# Patient Record
Sex: Female | Born: 1980 | Hispanic: Yes | Marital: Single | Smoking: Never smoker
Health system: Southern US, Community
[De-identification: ages and names within clinical notes are randomized; demographics above are authoritative.]

## PROBLEM LIST (undated history)

## (undated) ENCOUNTER — Inpatient Hospital Stay (HOSPITAL_COMMUNITY): Payer: Self-pay

## (undated) DIAGNOSIS — F32A Depression, unspecified: Secondary | ICD-10-CM

## (undated) DIAGNOSIS — F329 Major depressive disorder, single episode, unspecified: Secondary | ICD-10-CM

## (undated) DIAGNOSIS — G5601 Carpal tunnel syndrome, right upper limb: Secondary | ICD-10-CM

## (undated) HISTORY — DX: Major depressive disorder, single episode, unspecified: F32.9

## (undated) HISTORY — DX: Depression, unspecified: F32.A

---

## 2005-01-14 ENCOUNTER — Ambulatory Visit: Payer: Self-pay | Admitting: Family Medicine

## 2005-01-24 ENCOUNTER — Emergency Department (HOSPITAL_COMMUNITY): Admission: EM | Admit: 2005-01-24 | Discharge: 2005-01-24 | Payer: Self-pay | Admitting: Emergency Medicine

## 2005-03-23 ENCOUNTER — Ambulatory Visit: Payer: Self-pay | Admitting: Family Medicine

## 2005-03-31 ENCOUNTER — Encounter: Admission: RE | Admit: 2005-03-31 | Discharge: 2005-03-31 | Payer: Self-pay | Admitting: Family Medicine

## 2005-07-19 ENCOUNTER — Ambulatory Visit: Payer: Self-pay | Admitting: Family Medicine

## 2005-07-26 ENCOUNTER — Ambulatory Visit: Payer: Self-pay | Admitting: *Deleted

## 2005-09-07 ENCOUNTER — Ambulatory Visit: Payer: Self-pay | Admitting: Internal Medicine

## 2005-09-20 ENCOUNTER — Ambulatory Visit: Payer: Self-pay | Admitting: Internal Medicine

## 2005-09-27 ENCOUNTER — Ambulatory Visit: Payer: Self-pay | Admitting: Internal Medicine

## 2005-10-12 ENCOUNTER — Ambulatory Visit: Payer: Self-pay | Admitting: Family Medicine

## 2005-10-12 ENCOUNTER — Encounter (INDEPENDENT_AMBULATORY_CARE_PROVIDER_SITE_OTHER): Payer: Self-pay | Admitting: Family Medicine

## 2005-10-26 ENCOUNTER — Ambulatory Visit: Payer: Self-pay | Admitting: Family Medicine

## 2005-11-05 ENCOUNTER — Ambulatory Visit (HOSPITAL_COMMUNITY): Admission: RE | Admit: 2005-11-05 | Discharge: 2005-11-05 | Payer: Self-pay | Admitting: Family Medicine

## 2005-11-05 IMAGING — US US PELVIS COMPLETE MODIFY
1 series · 14 of 25 positions shown · non-contrast
Comparison: None.

CLINICAL DATA: Cervical motion tenderness.
 TRANSABDOMINAL AND TRANSVAGINAL PELVIC ULTRASOUND:
TECHNIQUE: Both transabdominal and transvaginal ultrasound examinations of the pelvis were performed including evaluation of the uterus, ovaries, adnexal regions, and pelvic cul-de-sac.

[Series 1: unknown · 0.29mm/px · 14 of 51 slices shown]
[im 1/51]
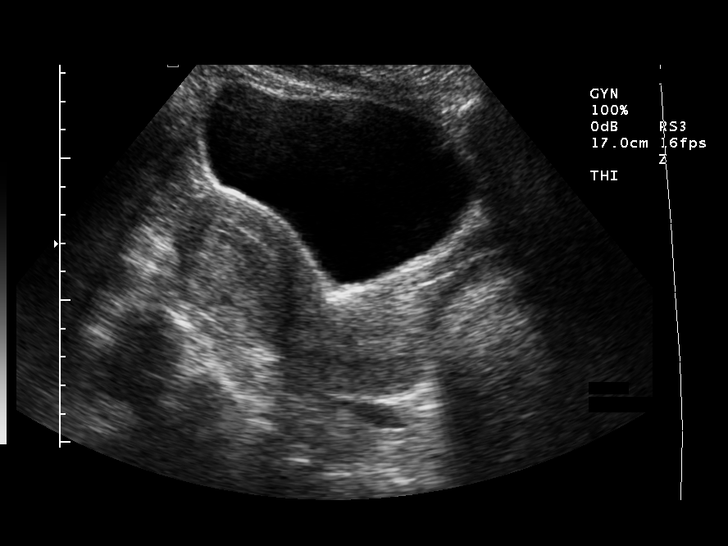
[im 5/51]
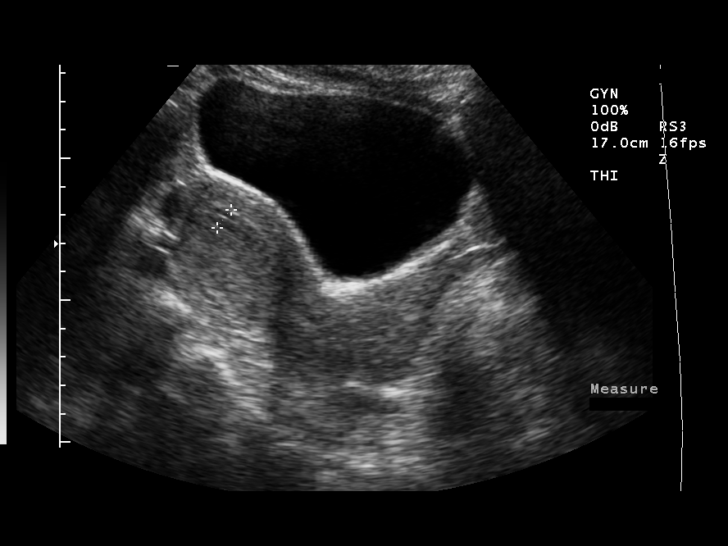
[im 9/51]
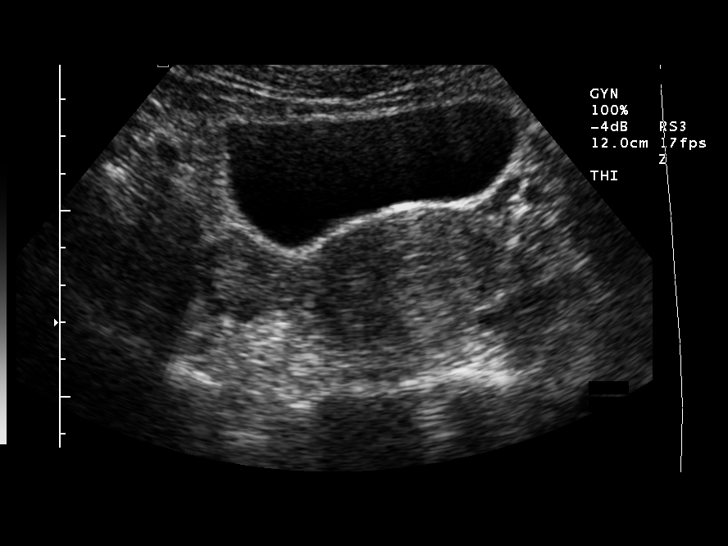
[im 13/51]
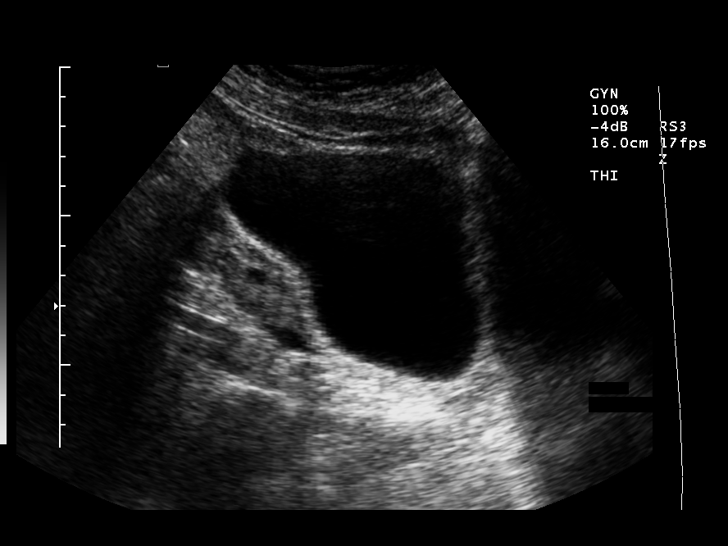
[im 17/51]
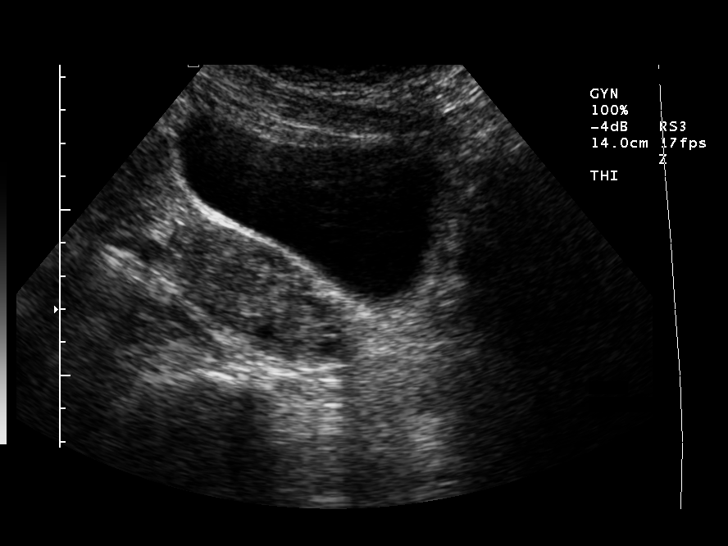
[im 19/51]
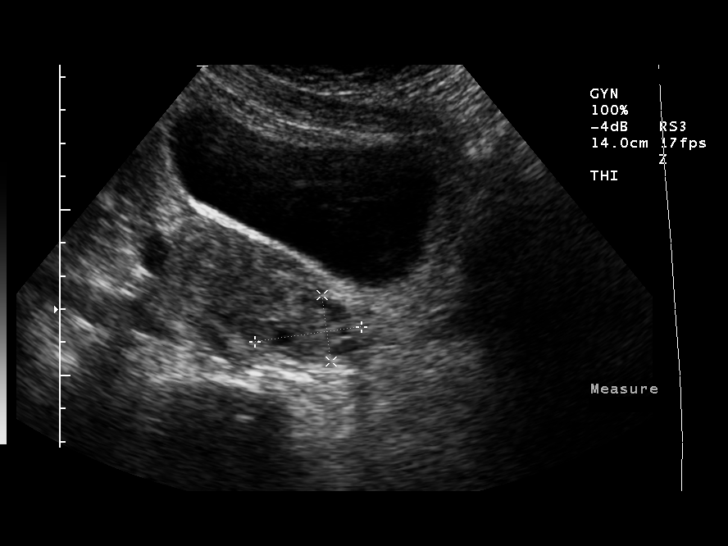
[im 23/51]
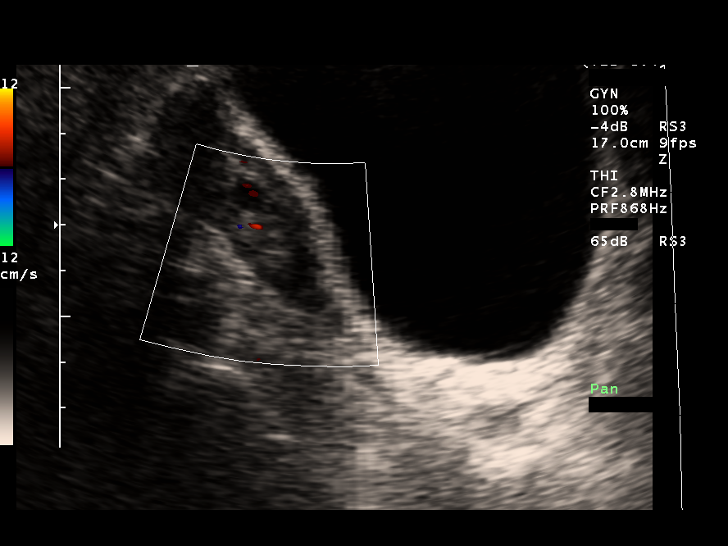
[im 28/51]
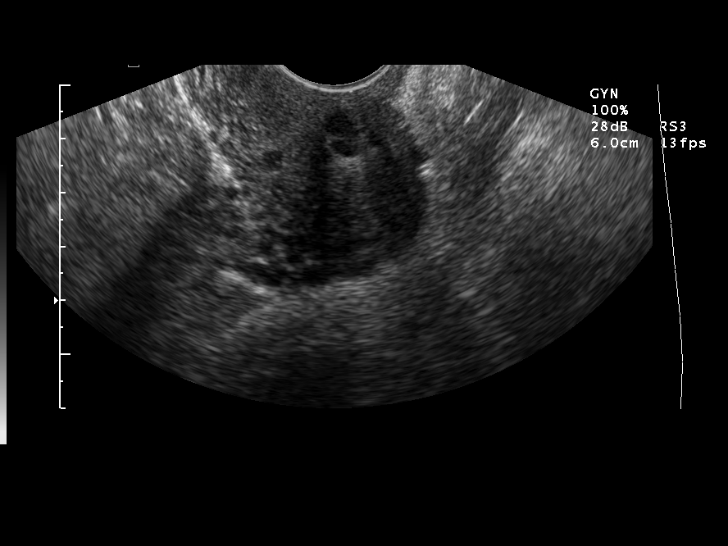
[im 32/51]
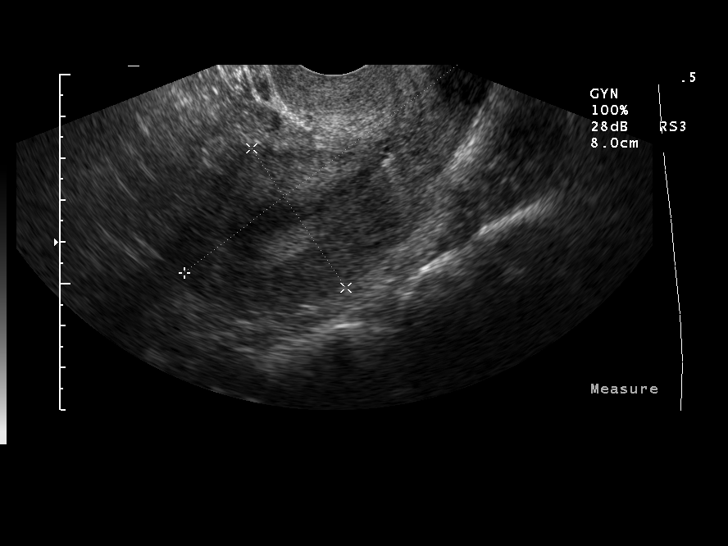
[im 34/51]
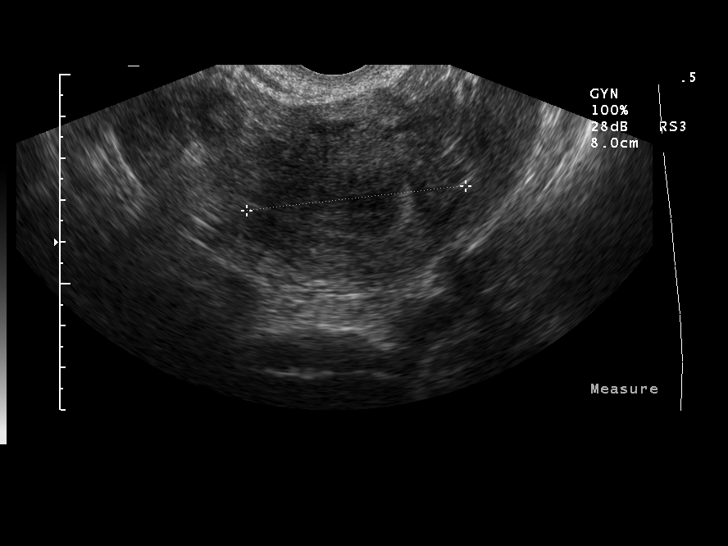
[im 38/51]
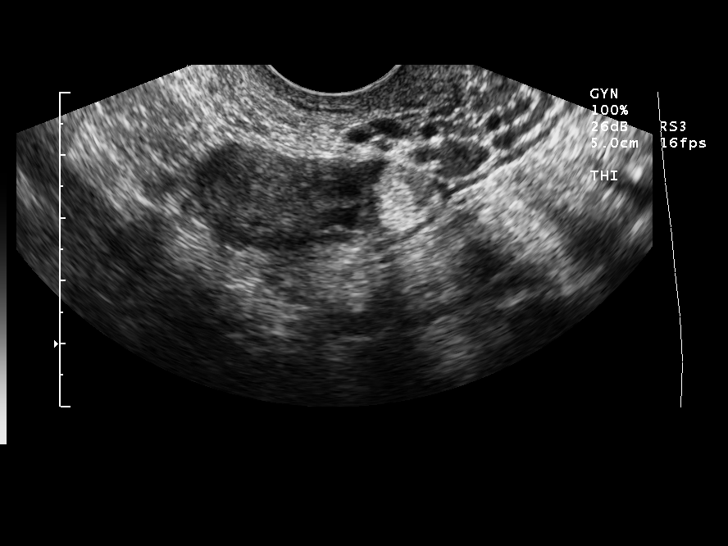
[im 42/51]
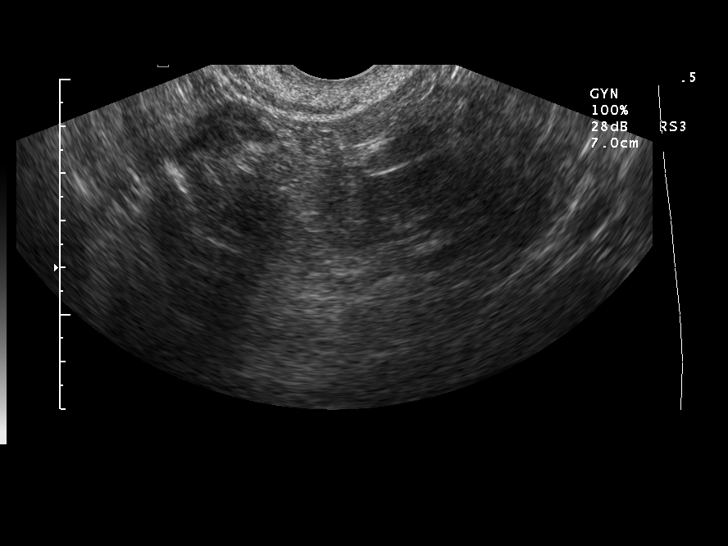
[im 46/51]
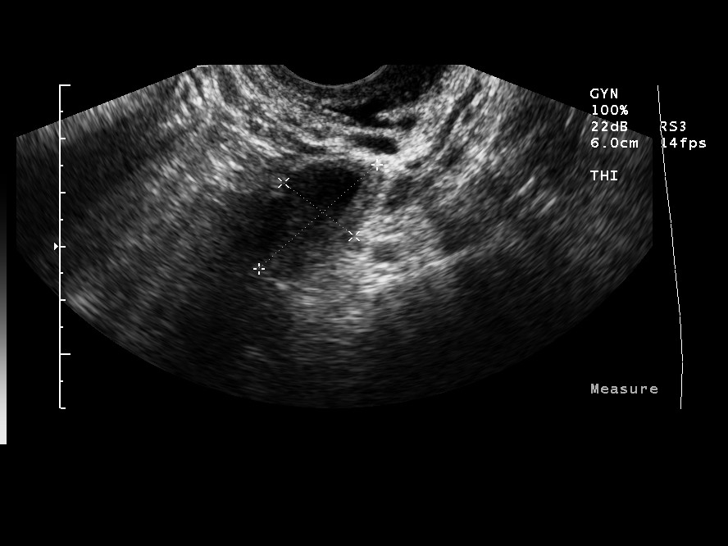
[im 51/51]
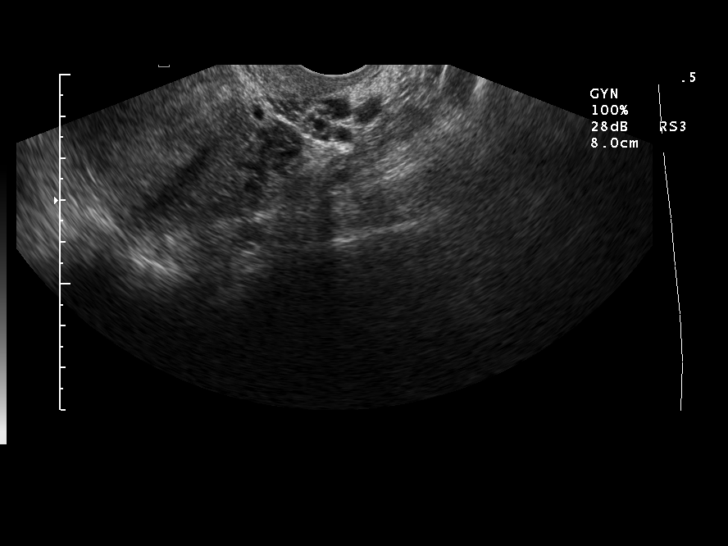

[14 of 25 positions shown; findings below may reference images not displayed]

FINDINGS: Uterus measures 10.3 x 4.1 x 6.0cm.  Endometrial stripe is 8mm.  Right ovary measures 3.5 x 1.8 x 1.8cm.  Left ovary measures 3.2 x 2.0 x 1.7cm.  A follicle is contained within.  Nabothian cysts are seen in the cervix.  There is a trace amount of pelvic free fluid.
IMPRESSION: Trace amount of pelvic free fluid.

## 2005-11-09 ENCOUNTER — Ambulatory Visit: Payer: Self-pay | Admitting: Family Medicine

## 2007-07-30 ENCOUNTER — Inpatient Hospital Stay (HOSPITAL_COMMUNITY): Admission: AD | Admit: 2007-07-30 | Discharge: 2007-07-30 | Payer: Self-pay | Admitting: Obstetrics and Gynecology

## 2007-08-07 ENCOUNTER — Emergency Department (HOSPITAL_COMMUNITY): Admission: EM | Admit: 2007-08-07 | Discharge: 2007-08-08 | Payer: Self-pay | Admitting: Emergency Medicine

## 2007-08-08 ENCOUNTER — Inpatient Hospital Stay (HOSPITAL_COMMUNITY): Admission: AD | Admit: 2007-08-08 | Discharge: 2007-08-08 | Payer: Self-pay | Admitting: Obstetrics & Gynecology

## 2007-09-07 ENCOUNTER — Inpatient Hospital Stay (HOSPITAL_COMMUNITY): Admission: AD | Admit: 2007-09-07 | Discharge: 2007-09-07 | Payer: Self-pay | Admitting: Obstetrics & Gynecology

## 2007-09-16 ENCOUNTER — Inpatient Hospital Stay (HOSPITAL_COMMUNITY): Admission: AD | Admit: 2007-09-16 | Discharge: 2007-09-16 | Payer: Self-pay | Admitting: Obstetrics & Gynecology

## 2007-09-27 ENCOUNTER — Ambulatory Visit: Payer: Self-pay | Admitting: *Deleted

## 2007-10-04 ENCOUNTER — Ambulatory Visit: Payer: Self-pay | Admitting: Obstetrics & Gynecology

## 2007-10-11 ENCOUNTER — Ambulatory Visit: Payer: Self-pay | Admitting: Obstetrics & Gynecology

## 2007-10-18 ENCOUNTER — Ambulatory Visit: Payer: Self-pay | Admitting: Obstetrics & Gynecology

## 2007-10-21 ENCOUNTER — Inpatient Hospital Stay (HOSPITAL_COMMUNITY): Admission: AD | Admit: 2007-10-21 | Discharge: 2007-10-21 | Payer: Self-pay | Admitting: Obstetrics and Gynecology

## 2007-10-26 ENCOUNTER — Ambulatory Visit: Payer: Self-pay | Admitting: *Deleted

## 2007-10-26 ENCOUNTER — Inpatient Hospital Stay (HOSPITAL_COMMUNITY): Admission: AD | Admit: 2007-10-26 | Discharge: 2007-10-27 | Payer: Self-pay | Admitting: Obstetrics

## 2008-06-11 ENCOUNTER — Ambulatory Visit: Payer: Self-pay | Admitting: Family Medicine

## 2008-06-11 DIAGNOSIS — F329 Major depressive disorder, single episode, unspecified: Secondary | ICD-10-CM

## 2008-06-17 ENCOUNTER — Telehealth (INDEPENDENT_AMBULATORY_CARE_PROVIDER_SITE_OTHER): Payer: Self-pay | Admitting: *Deleted

## 2008-08-05 ENCOUNTER — Telehealth (INDEPENDENT_AMBULATORY_CARE_PROVIDER_SITE_OTHER): Payer: Self-pay | Admitting: *Deleted

## 2008-11-04 LAB — CONVERTED CEMR LAB

## 2009-01-10 ENCOUNTER — Telehealth (INDEPENDENT_AMBULATORY_CARE_PROVIDER_SITE_OTHER): Payer: Self-pay | Admitting: *Deleted

## 2009-01-15 ENCOUNTER — Ambulatory Visit: Payer: Self-pay | Admitting: Family Medicine

## 2009-01-15 ENCOUNTER — Encounter (INDEPENDENT_AMBULATORY_CARE_PROVIDER_SITE_OTHER): Payer: Self-pay | Admitting: *Deleted

## 2009-01-15 DIAGNOSIS — R300 Dysuria: Secondary | ICD-10-CM

## 2009-01-15 LAB — CONVERTED CEMR LAB
Glucose, Urine, Semiquant: NEGATIVE
KOH Prep: NEGATIVE
pH: 5

## 2009-01-16 ENCOUNTER — Encounter (INDEPENDENT_AMBULATORY_CARE_PROVIDER_SITE_OTHER): Payer: Self-pay | Admitting: Nurse Practitioner

## 2009-01-27 ENCOUNTER — Ambulatory Visit: Payer: Self-pay | Admitting: Family Medicine

## 2009-01-27 ENCOUNTER — Telehealth (INDEPENDENT_AMBULATORY_CARE_PROVIDER_SITE_OTHER): Payer: Self-pay | Admitting: Family Medicine

## 2009-01-27 DIAGNOSIS — G43909 Migraine, unspecified, not intractable, without status migrainosus: Secondary | ICD-10-CM | POA: Insufficient documentation

## 2009-04-17 ENCOUNTER — Ambulatory Visit: Payer: Self-pay | Admitting: Nurse Practitioner

## 2009-04-17 DIAGNOSIS — B373 Candidiasis of vulva and vagina: Secondary | ICD-10-CM

## 2009-04-17 DIAGNOSIS — B3731 Acute candidiasis of vulva and vagina: Secondary | ICD-10-CM | POA: Insufficient documentation

## 2009-04-17 DIAGNOSIS — F4320 Adjustment disorder, unspecified: Secondary | ICD-10-CM | POA: Insufficient documentation

## 2009-04-17 LAB — CONVERTED CEMR LAB
Bilirubin Urine: NEGATIVE
Blood in Urine, dipstick: NEGATIVE
Glucose, Urine, Semiquant: NEGATIVE
KOH Prep: NEGATIVE
Ketones, urine, test strip: NEGATIVE
Protein, U semiquant: NEGATIVE
Urobilinogen, UA: 0.2
pH: 6.5

## 2010-05-04 ENCOUNTER — Ambulatory Visit: Payer: Self-pay | Admitting: Nurse Practitioner

## 2010-05-04 DIAGNOSIS — F341 Dysthymic disorder: Secondary | ICD-10-CM | POA: Insufficient documentation

## 2010-05-04 DIAGNOSIS — N6019 Diffuse cystic mastopathy of unspecified breast: Secondary | ICD-10-CM

## 2010-05-04 LAB — CONVERTED CEMR LAB
Bilirubin Urine: NEGATIVE
Glucose, Urine, Semiquant: NEGATIVE
Specific Gravity, Urine: 1.01
Urobilinogen, UA: 0.2
pH: 6

## 2010-05-06 ENCOUNTER — Encounter: Admission: RE | Admit: 2010-05-06 | Discharge: 2010-05-06 | Payer: Self-pay | Admitting: Internal Medicine

## 2010-05-14 ENCOUNTER — Ambulatory Visit: Payer: Self-pay | Admitting: Internal Medicine

## 2010-05-14 DIAGNOSIS — R21 Rash and other nonspecific skin eruption: Secondary | ICD-10-CM | POA: Insufficient documentation

## 2010-05-14 LAB — CONVERTED CEMR LAB
Bilirubin Urine: NEGATIVE
Glucose, Urine, Semiquant: NEGATIVE
Specific Gravity, Urine: <1.005
Urobilinogen, UA: 0.2
pH: 6

## 2010-05-17 DIAGNOSIS — R82998 Other abnormal findings in urine: Secondary | ICD-10-CM | POA: Insufficient documentation

## 2010-06-18 ENCOUNTER — Ambulatory Visit: Payer: Self-pay | Admitting: Nurse Practitioner

## 2010-06-19 ENCOUNTER — Encounter (INDEPENDENT_AMBULATORY_CARE_PROVIDER_SITE_OTHER): Payer: Self-pay | Admitting: Nurse Practitioner

## 2010-06-22 ENCOUNTER — Encounter (INDEPENDENT_AMBULATORY_CARE_PROVIDER_SITE_OTHER): Payer: Self-pay | Admitting: Nurse Practitioner

## 2010-06-24 ENCOUNTER — Ambulatory Visit: Payer: Self-pay | Admitting: Nurse Practitioner

## 2010-06-24 LAB — CONVERTED CEMR LAB
Casts: NONE SEEN /lpf
Ketones, urine, test strip: NEGATIVE
Protein, U semiquant: NEGATIVE
Specific Gravity, Urine: >=1.03
pH: 5.5

## 2010-06-25 ENCOUNTER — Encounter (INDEPENDENT_AMBULATORY_CARE_PROVIDER_SITE_OTHER): Payer: Self-pay | Admitting: Nurse Practitioner

## 2010-06-29 ENCOUNTER — Encounter (INDEPENDENT_AMBULATORY_CARE_PROVIDER_SITE_OTHER): Payer: Self-pay | Admitting: Nurse Practitioner

## 2010-07-06 LAB — CONVERTED CEMR LAB: Total Bilirubin: 1.6 mg/dL — ABNORMAL HIGH (ref 0.3–1.2)

## 2010-07-09 ENCOUNTER — Ambulatory Visit: Payer: Self-pay | Admitting: Nurse Practitioner

## 2010-07-09 DIAGNOSIS — R17 Unspecified jaundice: Secondary | ICD-10-CM | POA: Insufficient documentation

## 2010-07-10 ENCOUNTER — Telehealth (INDEPENDENT_AMBULATORY_CARE_PROVIDER_SITE_OTHER): Payer: Self-pay | Admitting: Nurse Practitioner

## 2010-07-13 LAB — CONVERTED CEMR LAB: Retic Ct Pct: 2.1 % (ref 0.4–3.1)

## 2010-08-05 ENCOUNTER — Telehealth (INDEPENDENT_AMBULATORY_CARE_PROVIDER_SITE_OTHER): Payer: Self-pay | Admitting: Nurse Practitioner

## 2010-08-06 ENCOUNTER — Ambulatory Visit: Payer: Self-pay | Admitting: Nurse Practitioner

## 2010-08-07 ENCOUNTER — Telehealth (INDEPENDENT_AMBULATORY_CARE_PROVIDER_SITE_OTHER): Payer: Self-pay | Admitting: Internal Medicine

## 2010-09-10 ENCOUNTER — Ambulatory Visit: Admit: 2010-09-10 | Payer: Self-pay | Admitting: Nurse Practitioner

## 2010-09-27 ENCOUNTER — Encounter: Payer: Self-pay | Admitting: Family Medicine

## 2010-10-04 LAB — CONVERTED CEMR LAB
ALT: 14 units/L (ref 0–35)
Alkaline Phosphatase: 53 units/L (ref 39–117)
Basophils Absolute: 0.1 10*3/uL (ref 0.0–0.1)
Eosinophils Absolute: 1.1 10*3/uL — ABNORMAL HIGH (ref 0.0–0.7)
Eosinophils Relative: 11 % — ABNORMAL HIGH (ref 0–5)
GC Probe Amp, Genital: NEGATIVE
Glucose, Urine, Semiquant: NEGATIVE
HCT: 40.4 % (ref 36.0–46.0)
Ketones, urine, test strip: NEGATIVE
LDL Cholesterol: 95 mg/dL (ref 0–99)
MCV: 92 fL (ref 78.0–100.0)
Nitrite: NEGATIVE
Platelets: 238 10*3/uL (ref 150–400)
RDW: 13.1 % (ref 11.5–15.5)
Sodium: 138 meq/L (ref 135–145)
Specific Gravity, Urine: <1.005
Total Bilirubin: 1.6 mg/dL — ABNORMAL HIGH (ref 0.3–1.2)
Total Protein: 7.3 g/dL (ref 6.0–8.3)
VLDL: 31 mg/dL (ref 0–40)
pH: 5

## 2010-10-06 NOTE — Letter (Signed)
Summary: Handout Printed  Printed Handout:  - Anxiety and Panic Attacks 

## 2010-10-06 NOTE — Progress Notes (Signed)
Summary: LAB RESULTS   Phone Note Call from Patient   Caller: Patient Reason for Call: Lab or Test Results Summary of Call: PT WANTS TO KNOW HER LAB RESULTS PLEASE, CALL HER @ [phone]  [facility] PT)  Initial call taken by: Cheryll Dessert,  July 10, 2010 9:02 AM  Follow-up for Phone Call              forward to N. Martin,fnp Follow-up by: Levon Hedger,  July 10, 2010 12:24 PM  Additional Follow-up for Phone Call Additional follow up Details #1::        Arna Medici to assist with interpretation pt was JUST here yesterday for labs they have not all returned she will be called when all results are available Additional Follow-up by: [name] FNP,  July 10, 2010 12:29 PM    Additional Follow-up for Phone Call Additional follow up Details #2::    Per Arna Medici pt informed of above. Follow-up by: Levon Hedger,  July 10, 2010 12:41 PM

## 2010-10-06 NOTE — Progress Notes (Signed)
Summary: Office Visit//DEPRESSION SCREENING  Office Visit//DEPRESSION SCREENING   Imported By: Arta Bruce 06/23/2010 14:56:04  _____________________________________________________________________  External Attachment:    Type:   Image     Comment:   External Document

## 2010-10-06 NOTE — Letter (Signed)
Summary: *HSN Results Follow up  [facility]  [address] [place], [place] [address]   Phone: [phone]  Fax: [phone]      06/29/2010   Amber Travis [address] [place], [place]  [address]   Dear  Ms. Amber Travis,                            ____S.Drinkard,FNP   ____D. Gore,FNP       ____B. McPherson,MD   ____V. Rankins,MD    ____E. Mulberry,MD    __X__N. Daphine Deutscher, FNP  ____D. Reche Dixon, MD    ____K. Philipp Deputy, MD    ____Other     This letter is to inform you that your recent test(s):  ___X____Pap Smear    _______Lab Test     _______X-ray    ___X___ is within acceptable limits  _______ requires a medication change  _______ requires a follow-up lab visit  _______ requires a follow-up visit with your provider   Comments: Pap Smear results are negative.       _________________________________________________________ If you have any questions, please contact our office [phone].                    Sincerely,    [name] FNP [facility]

## 2010-10-06 NOTE — Progress Notes (Signed)
Summary: Birth Control  Phone Note From Pharmacy   Summary of Call: Pt ordered ortho cept, H.D. Pharmacy does not carry per Diane [phone].  They have ortho tri cyclin, micronor and orthcyclin can any of those be substituted? Initial call taken by: Gaylyn Cheers RN,  August 07, 2010 10:03 AM  Follow-up for Phone Call        PATIENT CALL TO SEE IF IT IS READY PHONE [phone] Follow-up by: Domenic Polite,  August 07, 2010 10:58 AM  Additional Follow-up for Phone Call Additional follow up Details #1::        Called [facility] pharmacy--only other OCP would be [facility] througth them. Will try Ortho - Cyclen instead.  If she notes continued problems after 3- 4months to call. Additional Follow-up by: Julieanne Manson MD,  August 07, 2010 2:16 PM    Additional Follow-up for Phone Call Additional follow up Details #2::    Faxed Rx to [facility] pharmacy. Called pt. and notified her that her Rx. can be p/u. Adv. her if her problems still continue after being on [facility] pills for 3/4 months to call us back.  Follow-up by: Hale Drone CMA,  August 07, 2010 2:36 PM  New/Updated Medications: ORTHO-CYCLEN (28) 0.25-35 MG-MCG TABS (NORGESTIMATE-ETH ESTRADIOL) 1 tab by mouth daily as directed. Prescriptions: ORTHO-CYCLEN (28) 0.25-35 MG-MCG TABS (NORGESTIMATE-ETH ESTRADIOL) 1 tab by mouth daily as directed.  #1 pack x 6   Entered and Authorized by:   Julieanne Manson MD   Signed by:   Julieanne Manson MD on 08/07/2010   Method used:   Printed then faxed to ...       [facility] (retail)       [address] [place], [place]  [address]       Ph: [phone]       Fax: [phone]   RxID:   [id number]

## 2010-10-06 NOTE — Letter (Signed)
Summary: Handout Printed  Printed Handout:  - Fibrocystic Breast Disease 

## 2010-10-06 NOTE — Progress Notes (Signed)
Summary: NEED CHANGE  BIRTH CONTROL PILLS   Phone Note Call from Patient Call back at [phone]   Summary of Call: PT WANTS TO CHANGE BIRTH CONTROL PILLS, SHE IS HAVING WEIGT PROBLEMS AND BLEEDING PROBLEMS. SHE STOP TAKEN THEM. Initial call taken by: Domenic Polite,  August 05, 2010 10:40 AM  Follow-up for Phone Call        Via interpreter.  States bleeding is like an extra period, started yesterday., has used two pads since last night.  Last period started November 20th.  Also complaining of stomach burning/pain and that she is gaining weight -- is weighing herself on a scale.  Also having dizziness, headache.  Started pills after last visit and stopped taking BC pills two weeks ago.  Wants another type of pill, that won't cause her to gain weight.  She uses [facility] [address] Follow-up by: Dutch Quint RN,  August 05, 2010 10:49 AM  Additional Follow-up for Phone Call Additional follow up Details #1::        B/C pt has started and then stopped pills this is likely why she has had some breakthrough bleeding.  Although it is not uncommon to have bleeding for the 1st and 2nd pack of taking the pill. all hormones have some weight gain indication because of the hormones. pills have less indication anything longer acting such as depoprovera.  for further discussion pt will need to schedule a f/u appt with me.  if she wants to restart she should do so the sunday after her next period. however she should use condoms Additional Follow-up by: [name] FNP,  August 05, 2010 12:20 PM    Additional Follow-up for Phone Call Additional follow up Details #2::    pt says she wants to change birth control pills because of the weight gain..... Arm[place] Shannon  August 05, 2010 2:33 PM pt wants to speak with provider so will scheudle her appt.Marland KitchenMarland KitchenArmenia Shannon  August 05, 2010 2:38 PM

## 2010-10-06 NOTE — Assessment & Plan Note (Signed)
Summary: **POSS RASH ALL OVER BODY/LR   Vital Signs:  Patient profile:   30 year old female Height:      60 inches Weight:      142.7 pounds Temp:     98.2 degrees F oral Pulse rate:   88 / minute Pulse rhythm:   regular Resp:     18 per minute BP sitting:   120 / 80  (left arm) Cuff size:   regular  Vitals Entered By: Arm[place] Shannon (May 14, 2010 9:04 AM) CC: pt has a rash on back and stomach for three days now... pt says she has taken bendaryl................Marland Kitchen pt did not bring in meds today Is Patient Diabetic? No Pain Assessment Patient in pain? no       Does patient need assistance? Functional Status Self care Ambulation Normal   Primary Care Provider:  [name] FNP  CC:  pt has a rash on back and stomach for three days now... pt says she has taken bendaryl................Marland Kitchen pt did not bring in meds today.  History of Present Illness: 30 year old female presents for rash.  Community liaison is used for interpreting.  the rash started 3 days ago.  She denies starting any new medications.  She has not started the Celexa yet.  She denies using any new detergents or lotions or soaps.  She has not been working outdoors.  No pain at home as a rash.  She denies headaches, nausea, vomiting, diarrhea.  She denies fevers or chills.  She denies cough.  She denies stomach pain.  She denies any neck stiffness.  She has used some Benadryl with slight improvement.  She has only been using Benadryl cream.  Problems Prior to Update: 1)  Skin Rash  (ICD-782.1) 2)  Anxiety Depression  (ICD-300.4) 3)  Fibrocystic Breast Disease  (ICD-610.1) 4)  Unspecified Adjustment Reaction  (ICD-309.9) 5)  Candidiasis of Vulva and Vagina  (ICD-112.1) 6)  Migraine Headache  (ICD-346.90) 7)  Dysuria  (ICD-788.1) 8)  Depression, Mild  (ICD-311) 9)  Contraceptive Management  (ICD-V25.09)  Current Medications (verified): 1)  Celexa 20 Mg Tabs (Citalopram Hydrobromide) .... One Tablet By Mouth  Daily For Mood  Allergies (verified): No Known Drug Allergies  Past History:  Past Medical History: Last updated: 06/11/2008 h/o migraines h/o depression  Social History: Last updated: 06/11/2008 Single 3 children ages 31,4,7 months Has sister here in [place]. Never Smoked Alcohol use-no Drug use-no  Physical Exam  General:  alert, well-developed, and well-nourished.   Head:  normocephalic and atraumatic.   Neck:  supple.   Lungs:  normal breath sounds.   Heart:  normal rate and regular rhythm.   Neurologic:  alert & oriented X3 and cranial nerves II-XII intact.   Skin:  diffuse maculopapular rash without excoriation + blanches palpable noted on arms, legs, back, belly, face  Psych:  good eye contact.     Impression & Recommendations:  Problem # 1:  SKIN RASH (ICD-782.1)  ? allergic reaction not sure to what ? stress related with recent anxiety reaction no new meds, detergents, creams, lotions, etc hesitant to start prednisone right now will give benadryl three times a day ; pepcid two times a day TAC two times a day as needed for itching  Her updated medication list for this problem includes:    Triamcinolone Acetonide 0.1 % Crea (Triamcinolone acetonide) .Marland Kitchen... Apply small amount  to any aread two times a day as needed for itching  Complete Medication List: 1)  Celexa 20 Mg Tabs (Citalopram hydrobromide) .... One tablet by mouth daily for mood 2)  Triamcinolone Acetonide 0.1 % Crea (Triamcinolone acetonide) .... Apply small amount  to any aread two times a day as needed for itching 3)  Pepcid 20 Mg Tabs (Famotidine) .... Take 1 tablet by mouth two times a day 4)  Diphenhydramine Hcl 25 Mg Caps (Diphenhydramine hcl) .... Take 1 capsule by mouth three times a day for one week  Other Orders: T-Culture, Urine ([id number]) T- * Misc. Laboratory test [phone])  Patient Instructions: 1)  Get detergent that is dye free. 2)  Get soap that is dye free. 3)  Do  not use any lotions or creams with dye or perfume in it. 4)  Take pepcid two times a day for 1 week. 5)  Take benadryl three times a day for one week. 6)  Use the Triamcinolone cream only if you have itching.  You can only apply it to areas that itch two times a day. 7)  Follow up Monday with [facility] or Tiawanna Luchsinger to follow up on rash. 8)  Return sooner if the rash gets worse or you develop fevers, chills, headache, nausea, vomiting, diarrhea, etc. 9)  Medicines faxed to [facility]. pharmacy.  Pick up today. Prescriptions: DIPHENHYDRAMINE HCL 25 MG CAPS (DIPHENHYDRAMINE HCL) Take 1 capsule by mouth three times a day for one week  #30 x 1   Entered and Authorized by:   Tereso Newcomer PA-C   Signed by:   Tereso Newcomer PA-C on 05/14/2010   Method used:   Faxed to ...       [facility] (retail)       [address] [place], [place]  [address]       Ph: [phone]       Fax: [phone]   RxID:   [id number] PEPCID 20 MG TABS (FAMOTIDINE) Take 1 tablet by mouth two times a day  #60 x 0   Entered and Authorized by:   Tereso Newcomer PA-C   Signed by:   Tereso Newcomer PA-C on 05/14/2010   Method used:   Faxed to ...       [facility] - [facility] (retail)       [address] [place], [place]  [address]       Ph: [phone]       Fax: [phone]   RxID:   [id number] TRIAMCINOLONE ACETONIDE 0.1 % CREA (TRIAMCINOLONE ACETONIDE) apply small amount  to any aread two times a day as needed for itching  #30 grams x 0   Entered and Authorized by:   Tereso Newcomer PA-C   Signed by:   Tereso Newcomer PA-C on 05/14/2010   Method used:   Faxed to ...       [facility] - [facility] (retail)       [address] [place], [place]  [address]       Ph: [phone]       Fax: [phone]   RxID:   [id number]   Laboratory Results   Urine Tests    Routine Urinalysis   Glucose:  negative   (Normal Range: Negative) Bilirubin: negative   (Normal Range: Negative) Ketone: negative   (Normal Range: Negative) Spec. Gravity: <1.005   (Normal Range: 1.003-1.035) Blood: trace-lysed   (Normal Range: Negative) pH: 6.0   (  Normal Range: 5.0-8.0) Protein: negative   (Normal Range: Negative) Urobilinogen: 0.2   (Normal Range: 0-1) Nitrite: negative   (Normal Range: Negative) Leukocyte Esterace: trace   (Normal Range: Negative)

## 2010-10-06 NOTE — Assessment & Plan Note (Signed)
Summary: Anxiety/Fibrocystic Breast   Vital Signs:  Patient profile:   30 year old female Height:      60 inches Weight:      139.0 pounds BMI:     27.24 Temp:     97.5 degrees F oral Pulse rate:   76 / minute Pulse rhythm:   regular Resp:     20 per minute BP sitting:   116 / 66  (left arm) Cuff size:   regular  Vitals Entered By: Levon Hedger (May 04, 2010 11:29 AM)  Nutrition Counseling: Patient's BMI is greater than 25 and therefore counseled on weight management options.  History of Present Illness:  Pt into the office with nervousness. Started 1 week ago. " I feel like my heart is going to come out" Pt reports that she lost her job on last week. She is concerned about bills and other stressors. Pt reports that she wants to rest but she is not able. Her mind just keeps racing and thinking about things. Takes about - 1 hr before she can go to sleep Has 3 children and 2 are school aged.  She stays at home with the 30 year old now that she is not working.  Migraines - history but headaches have increased over the past week  [employer] serving as interpreter for pt today  Depression History:      The patient is having a depressed mood most of the day and has a diminished interest in her usual daily activities.  Positive alarm features for depression include insomnia, fatigue (loss of energy), and impaired concentration (indecisiveness).  However, she denies recurrent thoughts of death or suicide.        Psychosocial stress factors include major life changes.  The patient denies that she feels like life is not worth living, denies that she wishes that she were dead, and denies that she has thought about ending her life.         Depression Treatment History:  Prior Medication Used:   Start Date: Assessment of Effect:   Comments:  Celexa (citalopram)     05/04/2010     --       started        Habits & Providers  Alcohol-Tobacco-Diet     Alcohol  drinks/day: 0     Tobacco Status: never  Exercise-Depression-Behavior     Does Patient Exercise: no     Depression Counseling: further diagnostic testing and/or other treatment is indicated     Drug Use: no     Seat Belt Use: 0  Allergies: No Known Drug Allergies  Review of Systems General:  Reports bilateral breast tenderness - ongoing for the past year.  Drinks Herbal life due to migraine. Denies any coffee or sodas.. CV:  Denies chest pain or discomfort. Resp:  Denies cough. GI:  Denies abdominal pain, nausea, and vomiting. GU:  Complains of dysuria; denies discharge, hematuria, and urinary frequency; Started 1 week ago - burning with urination. Psych:  Complains of anxiety.  Physical Exam  General:  alert.   Head:  normocephalic.   Breasts:  left - upper outer, tenderness, ? linear palpable mass right - normal  Lungs:  normal breath sounds.   Heart:  normal rate and regular rhythm.     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 One tablet by mouth daily for mood  Problem # 3:  FIBROCYSTIC BREAST DISEASE (ICD-610.1)  advised pt of the dx Most likely the herbal life that she is taking - advised her to stop taking it but pt is reluctant will order ultrasound   Orders: Ultrasound (Ultrasound)  Problem # 4:  DYSURIA (ICD-788.1)  Orders: UA Dipstick w/o Micro (manual) (40347)  Complete Medication List: 1)  Celexa 20 Mg Tabs (Citalopram hydrobromide) .... One tablet by mouth daily for mood  Patient Instructions: 1)  Schedule an appointment for a complete physical exam 2)  Do not eat after midnight before this visit 3)  Keep the appointment for your left breast ultrasound. 4)  You most likely have fibrocystic breast  tissue which is aggravated by Herbal life, caffiene. 5)  Anxiety - You most likely have anxiety from losing your job and other stressors.  Will start celexa 20mg  by mouth nightly for anxiety 6)  Birth Control - This will be discussed at the next visit. 7)  You can restart depoprovera if necessary 8)  Urine sample - ok today except some blood in the urine which is likely due to period.  No infection.  Be sure to drink plenty of water.  Wipe from front to back Prescriptions: CELEXA 20 MG TABS (CITALOPRAM HYDROBROMIDE) One tablet by mouth daily for mood  #30 x 3   Entered and Authorized by:   [name] FNP   Signed by:   [name] FNP on 05/04/2010   Method used:   Print then Give to Patient   RxID:   [id number]     Laboratory Results   Urine Tests  Date/Time Received: May 04, 2010 12:28 PM   Routine Urinalysis   Color: lt. yellow Glucose: negative   (Normal Range: Negative) Bilirubin: negative   (Normal Range: Negative) Ketone: negative   (Normal Range: Negative) Spec. Gravity: 1.010   (Normal Range: 1.003-1.035) Blood: large   (Normal Range: Negative) pH: 6.0   (Normal Range: 5.0-8.0) Protein: negative   (Normal Range: Negative) Urobilinogen: 0.2   (Normal Range: 0-1) Nitrite: negative   (Normal Range: Negative) Leukocyte Esterace: negative   (Normal Range: Negative)

## 2010-10-06 NOTE — Assessment & Plan Note (Signed)
Summary: Family Planning   Vital Signs:  Patient profile:   30 year old female Menstrual status:  regular LMP:     07/26/2010 Weight:      148.19 pounds Temp:     97 degrees F oral Pulse rate:   66 / minute Pulse rhythm:   regular Resp:     22 per minute BP sitting:   120 / 74  (left arm) Cuff size:   regular  Vitals Entered By: Hale Drone CMA (August 06, 2010 9:53 AM) CC: Wants to change birth control pills. States she has gained a lot of weight and she feels very anxious that all she does is eat. Also causing her HA's. This has been going on for the last 2 weeks. Pt. is complaiining of light headed at the moment. Her last period began on 07/26/10 but stopped on 07/31/10 but began again on 08/03/10. , Depression, Headache Is Patient Diabetic? No Pain Assessment Patient in pain? no       Does patient need assistance? Functional Status Self care Ambulation Normal LMP (date): 07/26/2010 LMP - Character: normal     Menstrual flow (days): 5 Enter LMP: 07/26/2010 Last PAP Result  Specimen Adequacy: Satisfactory for evaluation.   Interpretation/Result:Negative for intraepithelial Lesion or Malignancy.      Primary Care Provider:  [name] FNP  CC:  Wants to change birth control pills. States she has gained a lot of weight and she feels very anxious that all she does is eat. Also causing her HA's. This has been going on for the last 2 weeks. Pt. is complaiining of light headed at the moment. Her last period began on 07/26/10 but stopped on 07/31/10 but began again on 08/03/10. , Depression, and Headache.  History of Present Illness:  Pt into the office for f/u on birth control. she was started on birth control on 06/18/2010 and she took one full pack of pills. She did not start another pack due to side effect +headache +weight gain (gained 10 pounds since last visit) +eating alot +tired LMP - 07/31/2010 and then 08/04/2010  Community Liason present today with  pt - Spanish interpreter The patient denies that she feels like life is not worth living, denies that she wishes that she were dead, and denies that she has thought about ending her life.        Comments:  ongoing problem for the pt - however she has not wanted medications in the past. Does not admit that she has a problems with depression.  Depression Treatment History:  Prior Medication Used:   Start Date: Assessment of Effect:   Comments:  Celexa (citalopram)     05/04/2010   side effects enough to stop   pt stopped taking the medications  Headache HPI:      Precipitating factors consist of perimenstrual.          Current Medications (verified): 1)  Celexa 20 Mg Tabs (Citalopram Hydrobromide) .... One Tablet By Mouth Daily For Mood 2)  Triamcinolone Acetonide 0.1 % Crea (Triamcinolone Acetonide) .... Apply Small Amount  To Any Aread Two Times A Day As Needed For Itching 3)  Pepcid 20 Mg Tabs (Famotidine) .... Take 1 Tablet By Mouth Two Times A Day 4)  Sprintec 28 0.25-35 Mg-Mcg Tabs (Norgestimate-Eth Estradiol) .... One Tablet By Mouth Daily 5)  Fluconazole 150 Mg Tabs (Fluconazole) .... One Tablet By Mouth X 1 Dose  Allergies (verified): No Known Drug Allergies  Review of Systems General:  Denies fever. CV:  Denies chest pain or discomfort. Resp:  Denies cough. GI:  Denies abdominal pain, nausea, and vomiting. Neuro:  Complains of headaches; +anxiety.  Physical Exam  General:  alert.   Head:  normocephalic.   Neck:  full ROM.   Neurologic:  alert & oriented X3.   Skin:  color normal.   Psych:  flat affect   Impression & Recommendations:  Problem # 1:  CONTRACEPTIVE MANAGEMENT (ICD-V25.09) pt stopped taking sprintec due to side effects such as anxiety will change birth control pills today  Problem # 2:  ANXIETY DEPRESSION (ICD-300.4) pt was started on celexa in the past but she stopped due to hormones  advised pt that she likely need a medication to help with her  anxiety however she seems convinced that it is from birth control pills   Complete Medication List: 1)  Triamcinolone Acetonide 0.1 % Crea (Triamcinolone acetonide) .... Apply small amount  to any aread two times a day as needed for itching 2)  Pepcid 20 Mg Tabs (Famotidine) .... Take 1 tablet by mouth two times a day 3)  Ortho-cept (28) 0.15-30 Mg-mcg Tabs (Desogestrel-ethinyl estradiol) .... One tablet by mouth daily for birth control  Patient Instructions: 1)  Start the new package of birth control pills on the Sunday after your next period 2)  Keep taking these pills daily until the next time you are seen in this office.  3)  Keep in mind that problems with mood may be due to anxiety so if it does not improve with the change in meds then perhaps we should assess the need to change medications for anxiety. 4)  Follow up in 6 weeks for medication review - Ortho Cept Prescriptions: ORTHO-CEPT (28) 0.15-30 MG-MCG TABS (DESOGESTREL-ETHINYL ESTRADIOL) One tablet by mouth daily for birth control  #1 x 6   Entered and Authorized by:   [name] FNP   Signed by:   [name] FNP on 08/06/2010   Method used:   Print then Give to Patient   RxID:   [id number]    Orders Added: 1)  Est. Patient Level III [14782]

## 2010-10-06 NOTE — Assessment & Plan Note (Signed)
Summary: Complete Physical Exam   Vital Signs:  Patient profile:   30 year old female Menstrual status:  regular LMP:     05/29/2009 Height:      60 inches Weight:      138 pounds BMI:     27.05 Temp:     98.2 degrees F oral Pulse rate:   80 / minute Pulse rhythm:   regular Resp:     16 per minute BP sitting:   100 / 60  (left arm) Cuff size:   regular  Vitals Entered By: Armenia Shannon (June 18, 2010 12:01 PM)  Nutrition Counseling: Patient's BMI is greater than 25 and therefore counseled on weight management options. CC: cpp....., Depression Is Patient Diabetic? No Pain Assessment Patient in pain? no       Does patient need assistance? Functional Status Self care Ambulation Normal LMP (date): 05/29/2009 LMP - Character: normal     Menstrual flow (days): 5 Menstrual Status regular Enter LMP: 05/29/2009 Last PAP Result done at [facility]   Primary Care Provider:  [name] FNP  CC:  cpp..... and Depression.  History of Present Illness:  Pt into the office for a complete physical exam  PAP - last done 1 year ago.  All previous PAP smears have been ok. 3 children single Menses - monthtly  Mammmogram - previous ultrasound of left breast no family history of breast cancer  Optho - no current glasses or contact  Dentist - no recent dental exam  Community liason present to interpret  Depression History:      Positive alarm features for depression include fatigue (loss of energy).  However, she denies recurrent thoughts of death or suicide.        Psychosocial stress factors include major life changes.  The patient denies that she feels like life is not worth living, denies that she wishes that she were dead, and denies that she has thought about ending her life.         Depression Treatment History:  Prior Medication Used:   Start Date: Assessment of Effect:   Comments:  Celexa (citalopram)     05/04/2010   side effects enough to stop    pt stopped taking the medications  Allergies: No Known Drug Allergies  Social History: Single 3 children Has sister here in [place]. Never Smoked Alcohol use-no Drug use-no  Review of Systems General:  Denies loss of appetite. Eyes:  Denies blurring. ENT:  Denies earache. CV:  Denies fatigue. Resp:  Denies cough. GI:  Denies abdominal pain, nausea, and vomiting. GU:  Denies dysuria. MS:  Denies joint pain. Derm:  Denies rash. Neuro:  Denies headaches. Psych:  Complains of depression.  Physical Exam  General:  alert.   Head:  normocephalic.   Eyes:  vision grossly intact, pupils equal, and pupils round.   Ears:  ear piercing(s) noted.   Nose:  no nasal discharge.   Mouth:  pharynx pink and moist.   Neck:  supple.   Chest Wall:  no mass.   Breasts:  skin/areolae normal.  tender multiple cystic areas Lungs:  normal breath sounds.   Heart:  normal rate and regular rhythm.   Abdomen:  normal bowel sounds.   Rectal:  no external abnormalities.   Msk:  normal ROM.   Neurologic:  alert & oriented X3.    Pelvic Exam  Vulva:      normal appearance.   Urethra and Bladder:      Urethra--normal.  Vagina:      physiologic discharge.   Cervix:      midposition.   Uterus:      smooth.   Adnexa:      normal.   Rectum:      heme negative stool.      Impression & Recommendations:  Problem # 1:  ROUTINE GYNECOLOGICAL EXAMINATION (ICD-V72.31) Assessment Unchanged labs done during recent visit self breast exam placcard given to pt labs done rec optho and dental exam  Orders: T-Lipid Profile ([address]) T-Comprehensive Metabolic Panel [id number]) T-CBC w/Diff (29562-13086) Rapid HIV  (92370) T-Syphilis Test (RPR) (57846-96295) T-TSH (28413-24401) UA Dipstick w/o Micro (manual) (02725) KOH/ WET Mount [id number]) Pap Smear, Thin Prep ( Collection of) (Q0091) T- GC Chlamydia (03474)  Problem # 2:  ANXIETY DEPRESSION (ICD-300.4) advised pt to restart the  medications  Problem # 3:  NEED PROPHYLACTIC VACCINATION&INOCULATION FLU (ICD-V04.81) given today in office  Problem # 4:  CONTRACEPTIVE MANAGEMENT (ICD-V25.09) urine pregnancy negative will start on meds Orders: Urine Pregnancy Test  (25956)  Complete Medication List: 1)  Celexa 20 Mg Tabs (Citalopram hydrobromide) .... One tablet by mouth daily for mood 2)  Triamcinolone Acetonide 0.1 % Crea (Triamcinolone acetonide) .... Apply small amount  to any aread two times a day as needed for itching 3)  Pepcid 20 Mg Tabs (Famotidine) .... Take 1 tablet by mouth two times a day 4)  Sprintec 28 0.25-35 Mg-mcg Tabs (Norgestimate-eth estradiol) .... One tablet by mouth daily 5)  Fluconazole 150 Mg Tabs (Fluconazole) .... One tablet by mouth x 1 dose  Other Orders: T-Culture, Urine ([id number]) Flu Vaccine 87yrs + (51884) Admin 1st Vaccine (16606) Admin 1st Vaccine [facility]) [phone])   Patient Instructions: 1)  You have received the flu vaccine today. 2)  Restart the celexa - take 1/2 tablet by mouth daily for 1 week then increase to one tablet by mouth daily. 3)  You have to take this medicaton daily for several weeks for it to get into your system. 4)  Start birth control pill on the Sunday following the next period. 5)  Follow up in 6 weeks with n.martin for medication review - Sprintec 6)  Yeast infection - May be the cause for burning and itching 7)  Take fluconazole 150mg  x 1 dose Prescriptions: FLUCONAZOLE 150 MG TABS (FLUCONAZOLE) One tablet by mouth x 1 dose  #1 x 0   Entered and Authorized by:   [name] FNP   Signed by:   [name] FNP on 06/18/2010   Method used:   Print then Give to Patient   RxID:   [id number] SPRINTEC 28 0.25-35 MG-MCG TABS (NORGESTIMATE-ETH ESTRADIOL) One tablet by mouth daily  #1 package x 12   Entered and Authorized by:   [name] FNP   Signed by:   [name] FNP on 06/18/2010   Method used:   Print then Give to Patient    RxID:   [id number]   Prevention & Chronic Care Immunizations   Influenza vaccine: Fluvax 3+  (06/18/2010)    Tetanus booster: Not documented    Pneumococcal vaccine: Not documented  Other Screening   [facility]   Smoking status: never  (05/04/2010)   Nursing Instructions: Give Flu vaccine today    Laboratory Results   Urine Tests  Date/Time Received: June 18, 2010 12:44 PM   Routine Urinalysis   Color: lt. yellow Glucose: negative   (Normal Range: Negative) Bilirubin: negative   (  Normal Range: Negative) Ketone: negative   (Normal Range: Negative) Spec. Gravity: <1.005   (Normal Range: 1.003-1.035) Blood: negative   (Normal Range: Negative) pH: 5.0   (Normal Range: 5.0-8.0) Protein: negative   (Normal Range: Negative) Urobilinogen: 0.2   (Normal Range: 0-1) Nitrite: negative   (Normal Range: Negative) Leukocyte Esterace: negative   (Normal Range: Negative)    Date/Time Received: June 18, 2010 6:07 PM   Wet Mount Source: vaginal WBC/hpf: 1-5 Bacteria/hpf: rare Clue cells/hpf: none Yeast/hpf: none Wet Mount KOH: Negative Trichomonas/hpf: none     Influenza Vaccine    Vaccine Type: Fluvax 3+    Site: left deltoid    Mfr: Gl[employer]oSmithKline    Dose: 0.5 ml    Route: IM    Given by: Armenia Shannon    Exp. Date: 02/2011    Lot #: [id number]    VIS given: 03/31/10 version given June 18, 2010.  Flu Vaccine Consent Questions    Do you have a history of severe allergic reactions to this vaccine? no    Any prior history of allergic reactions to egg and/or gelatin? no    Do you have a sensitivity to the preservative Thimersol? no    Do you have a past history of Guillan-Barre Syndrome? no    Do you currently have an acute febrile illness? no    Have you ever had a severe reaction to latex? no    Vaccine information given and explained to patient? yes    Are you currently pregnant? no  Appended  Document: Complete Physical Exam  Laboratory Results    Other Tests  Rapid HIV: negative

## 2010-10-06 NOTE — Letter (Signed)
Summary: Handout Printed  Printed Handout:  - Anxiety and Panic Attacks

## 2010-10-07 ENCOUNTER — Encounter (INDEPENDENT_AMBULATORY_CARE_PROVIDER_SITE_OTHER): Payer: Self-pay | Admitting: Nurse Practitioner

## 2010-10-07 ENCOUNTER — Encounter: Payer: Self-pay | Admitting: Nurse Practitioner

## 2010-10-07 DIAGNOSIS — K029 Dental caries, unspecified: Secondary | ICD-10-CM | POA: Insufficient documentation

## 2010-10-07 LAB — CONVERTED CEMR LAB
BUN: 8 mg/dL (ref 6–23)
CO2: 27 meq/L (ref 19–32)
Calcium: 9.3 mg/dL (ref 8.4–10.5)
Chloride: 103 meq/L (ref 96–112)
Creatinine, Ser: 0.58 mg/dL (ref 0.40–1.20)
Glucose, Bld: 76 mg/dL (ref 70–99)
Hemoglobin: 14.3 g/dL (ref 12.0–15.0)
Lymphocytes Relative: 31 % (ref 12–46)
Lymphs Abs: 2.4 10*3/uL (ref 0.7–4.0)
MCHC: 34.5 g/dL (ref 30.0–36.0)
Monocytes Absolute: 0.5 10*3/uL (ref 0.1–1.0)
Monocytes Relative: 7 % (ref 3–12)
Neutro Abs: 4.5 10*3/uL (ref 1.7–7.7)
RBC: 4.52 M/uL (ref 3.87–5.11)

## 2010-10-08 ENCOUNTER — Encounter (INDEPENDENT_AMBULATORY_CARE_PROVIDER_SITE_OTHER): Payer: Self-pay | Admitting: Nurse Practitioner

## 2010-10-14 NOTE — Letter (Signed)
Summary: DENTAL REFERRAL  DENTAL REFERRAL   Imported By: Arta Bruce 10/08/2010 10:10:01  _____________________________________________________________________  External Attachment:    Type:   Image     Comment:   External Document

## 2010-10-14 NOTE — Letter (Signed)
Summary: *HSN Results Follow up  [facility]  [address] [place], [place] [address]   Phone: [phone]  Fax: [phone]      10/08/2010   [name] [address] [place], [place]  [address]   Dear  Ms. Amber Travis,                            ____S.Drinkard,FNP   ____D. Gore,FNP       ____B. McPherson,MD   ____V. Rankins,MD    ____E. Mulberry,MD    _X___N. Daphine Deutscher, FNP  ____D. Reche Dixon, MD    ____K. Philipp Deputy, MD    ____Other     This letter is to inform you that your recent test(s):  _______Pap Smear    ___X____Lab Test     _______X-ray    ___X____ is within acceptable limits  _______ requires a medication change  _______ requires a follow-up lab visit  _______ requires a follow-up visit with your provider   Comments: Labs done during recent office visit are normal.       _________________________________________________________ If you have any questions, please contact our office [phone].                    Sincerely,    [name] FNP [facility]

## 2010-10-14 NOTE — Assessment & Plan Note (Signed)
Summary: F/u Meds   Vital Signs:  Patient profile:   30 year old female Menstrual status:  regular LMP:     09/28/2010 Weight:      146.2 pounds BMI:     28.66 Temp:     97.9 degrees F oral Pulse rate:   68 / minute Pulse rhythm:   regular Resp:     16 per minute BP sitting:   104 / 74  (left arm) Cuff size:   regular  Vitals Entered By: Levon Hedger (October 07, 2010 11:40 AM)  Nutrition Counseling: Patient's BMI is greater than 25 and therefore counseled on weight management options. CC: needs to have labs checked for anemia...birth control is making her gain weight and get pimples, Depression Is Patient Diabetic? No Pain Assessment Patient in pain? no       Does patient need assistance? Functional Status Self care Ambulation Normal LMP (date): 09/28/2010 LMP - Character: normal     Menstrual flow (days): 5 Enter LMP: 09/28/2010 Last PAP Result  Specimen Adequacy: Satisfactory for evaluation.   Interpretation/Result:Negative for intraepithelial Lesion or Malignancy.      Primary Care Provider:  [name] FNP  CC:  needs to have labs checked for anemia...birth control is making her gain weight and get pimples and Depression.  History of Present Illness:  Pt into the office for f/u on meds. She was started on oral contraceptives 08/06/2010.  pt was advised to take the medications until she was seen her today. Pt reports that she has been as ordered. She has finished 2 full packets of pills and has just started on her 3rd packet.  She did get her period as ordered.  Pt confesses that she does not like the oral contraception but she has taken them as agreed. She feels like it is making her gain weight and eat and also have pimples.  Community liason in office today to interpret  Optho - Last eye exam was 2 years ago. She was prescribed glasses at that time but she never got the prescription filled.  Dentist - no recent dental exam.  Pt would like to  get a referral  Depression History:      Positive alarm features for depression include fatigue (loss of energy).  However, she denies recurrent thoughts of death or suicide.        The patient denies that she feels like life is not worth living, denies that she wishes that she were dead, and denies that she has thought about ending her life.         Depression Treatment History:  Prior Medication Used:   Start Date: Assessment of Effect:   Comments:  Celexa (citalopram)     05/04/2010   side effects enough to stop   pt stopped taking the medications  Allergies (verified): No Known Drug Allergies  Review of Systems General:  Denies fever. ENT:  Denies earache. CV:  Denies chest pain or discomfort. Resp:  Denies cough. Derm:  +pimples to face.  Physical Exam  General:  alert.   Head:  normocephalic.   Msk:  normal ROM.   Neurologic:  alert & oriented X3.   Skin:  few comedomes to bil cheeks Psych:  Oriented X3.     Impression & Recommendations:  Problem # 1:  CONTRACEPTIVE MANAGEMENT (ICD-V25.09) pt is doing well with meds advised her to keep taking the medications  Problem # 2:  DENTAL CARIES (ICD-521.00) will refer to dental clinic -  Orders: Dental Referral (Dentist)  Problem # 3:  ANXIETY DEPRESSION (ICD-300.4) will see if pt can get referral to [facility] for couseling she has had before and agrees that it did help Orders: Misc. Referral (Misc. 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 Apply small amount  to any aread two times a day as needed for itching 2)  Pepcid 20 Mg Tabs (Famotidine) .... Take 1 tablet by mouth two times a day 3)  Ortho-cyclen (28) 0.25-35 Mg-mcg Tabs (Norgestimate-eth estradiol) .Marland Kitchen.. 1 tab by mouth daily as  directed.   Patient Instructions: 1)  Wash your face only with soap. 2)  Apply facial moisterizer which you can buy over the counter to the affected area. (StOctavio Manns) 3)  Keep taking your birth control as ordered. 4)  Go to the health department and get your refills. 5)  You should keep your appointment for eligibility on November 02, 2010. 6)  You can get an eye exam at [facility].  You can schedule your own appointment 7)  This office will check into counseling sessions for you.  This will help to greatly improve your mood.     Orders Added: 1)  Est. Patient Level III [29562] 2)  T-Comprehensive Metabolic Panel [[id number]] 3)  T-CBC w/Diff [[id number]] 4)  Dental Referral [Dentist] 5)  Misc. Referral [Misc. Ref]    Prevention & Chronic Care Immunizations   Influenza vaccine: Fluvax 3+  (06/18/2010)    Tetanus booster: Not documented    Pneumococcal vaccine: Not documented  Other Screening   Pap smear:  Specimen Adequacy: Satisfactory for evaluation.   Interpretation/Result:Negative for intraepithelial Lesion or Malignancy.     (06/18/2010)   Pap smear due: 06/2011   Smoking status: never  (05/04/2010)

## 2010-10-14 NOTE — Letter (Signed)
Summary: 2ND SHEET OF REFERRAL DENTAL  2ND SHEET OF REFERRAL DENTAL   Imported By: Arta Bruce 10/08/2010 10:11:34  _____________________________________________________________________  External Attachment:    Type:   Image     Comment:   External Document

## 2010-11-09 ENCOUNTER — Telehealth (INDEPENDENT_AMBULATORY_CARE_PROVIDER_SITE_OTHER): Payer: Self-pay | Admitting: Nurse Practitioner

## 2010-11-11 ENCOUNTER — Encounter (INDEPENDENT_AMBULATORY_CARE_PROVIDER_SITE_OTHER): Payer: Self-pay | Admitting: Nurse Practitioner

## 2010-11-11 LAB — CONVERTED CEMR LAB: Beta hcg, urine, semiquantitative: POSITIVE

## 2010-11-12 ENCOUNTER — Encounter (INDEPENDENT_AMBULATORY_CARE_PROVIDER_SITE_OTHER): Payer: Self-pay | Admitting: Nurse Practitioner

## 2010-11-12 ENCOUNTER — Encounter: Payer: Self-pay | Admitting: Nurse Practitioner

## 2010-11-17 NOTE — Assessment & Plan Note (Signed)
Summary: Review labs   Vital Signs:  Patient profile:   30 year old female Menstrual status:  irregular LMP:     09/28/2010 Weight:      148.1 pounds BMI:     29.03 Temp:     98.8 degrees F oral Pulse rate:   80 / minute Pulse rhythm:   regular Resp:     20 per minute BP sitting:   111 / 72  (left arm) Cuff size:   regular  Vitals Entered By: Levon Hedger (November 12, 2010 10:15 AM)  Nutrition Counseling: Patient's BMI is greater than 25 and therefore counseled on weight management options. CC: was in on 11/11/10 for urine pregancy test and was told the test was positive,  she had blood drawn as well and today she is back for results of lab test. Is Patient Diabetic? No Pain Assessment Patient in pain? yes     Location: lower stomach  Does patient need assistance? Functional Status Self care Ambulation Normal LMP (date): 09/28/2010 EDC by LMP==> 07/05/2011 EDC 07/05/2011 LMP - Character: normal     Menstrual flow (days): 5 On BCP's at conception: yes Date of + home preg. test: 10/07/2010 Enter LMP: 09/28/2010 Last PAP Result  Specimen Adequacy: Satisfactory for evaluation.   Interpretation/Result:Negative for intraepithelial Lesion or Malignancy.      Primary Care Provider:  [name] FNP  CC:  was in on 11/11/10 for urine pregancy test and was told the test was positive and she had blood drawn as well and today she is back for results of lab test..  History of Present Illness:  Pt the office to f/u on lab results positive urine pregnancy on yesterday. labs done and qualitatiave amount 11190.1  Pt called on last night with call a nurse and c/o back pain and pink discharge These symptoms started after she left the office on yesterday. Pt denies use of tampoons and or other medications.  She stopped using birth control.  Last dose was March 1st after pt called this office with a positive test and she was advised to quit taking birth control.  Pt has 3  children and would like to continue with this pregnancy as well.  Community liason present during exam  Allergies (verified): No Known Drug Allergies  Review of Systems General:  Denies fever. CV:  Denies chest pain or discomfort. Resp:  Denies cough. GI:  Denies abdominal pain, nausea, and vomiting.  Physical Exam  General:  alert.   Head:  normocephalic.   Msk:  normal ROM.   Neurologic:  alert & oriented X3.   Skin:  color normal.   Psych:  Oriented X3.  sad affect   Impression & Recommendations:  Problem # 1:  PREGNANCY EXAMINATION OR TEST POSITIVE RESULT (ICD-V72.42) labs reviewed with pt will return in 1 week for f/u labs - beta Hcg (quantitive)  Complete Medication List: 1)  Triamcinolone Acetonide 0.1 % Crea (Triamcinolone acetonide) .... Apply small amount  to any aread two times a day as needed for itching 2)  Pepcid 20 Mg Tabs (Famotidine) .... Take 1 tablet by mouth two times a day 3)  Ortho-cyclen (28) 0.25-35 Mg-mcg Tabs (Norgestimate-eth estradiol) .... Hold  Patient Instructions: 1)  Schedule a lab visit for next Wednesday - beta Hcg (quantative) 2)  Schedule an appointment with Triage nurse Aggie Cosier) on Thursday for lab results. 3)  Avoid any lifting, straining until visit on next week. 4)  Do not take any medications. 5)  Do not use any tampons   Orders Added: 1)  Est. Patient Level III [[address]]    Prevention & Chronic Care Immunizations   Influenza vaccine: Fluvax 3+  (06/18/2010)    Tetanus booster: Not documented    Pneumococcal vaccine: Not documented  Other Screening   Pap smear:  Specimen Adequacy: Satisfactory for evaluation.   Interpretation/Result:Negative for intraepithelial Lesion or Malignancy.     (06/18/2010)   Pap smear due: 06/2011   Smoking status: never  (05/04/2010)

## 2010-11-17 NOTE — Miscellaneous (Signed)
Summary: Positive Pregnancy Test   Patient Instructions: 1)  Urine pregnancy is positive today 2)  Will draw blood to confirm as she was on birth control pills 3)  Follow up with provider on Friday for re-assesment Clinical Lists Changes  Problems: Added new problem of PREGNANCY EXAMINATION OR TEST POSITIVE RESULT (ICD-V72.42) Assessed PREGNANCY EXAMINATION OR TEST POSITIVE RESULT as comment only -  positive today with urine advised pt not to take any more medications until her next visit  Orders: Urine Pregnancy Test  ([address]) T-Pregnancy (Serum), Quant. [phone])  Orders: Added new Service order of Est. Patient Level I (91478) - Signed Added new Service order of Urine Pregnancy Test  ([address]) - Signed Added new Test order of T-Pregnancy (Serum), Quant. [phone]) - Signed Observations: Added new observation of HPI:  Pt into the office because she had a positive pregnacy test at home. She reports that she was taking her oral contraceptives (except when her menses started) She was not taking any antibiotics or other medications  Pt has 3 children with the youngest being 22 years of age. (11/11/2010 8:41) Added new observation of EDC: 07/09/2011 (11/11/2010 8:41) Added new observation of EDD BY LMP: 07/09/2011 (11/11/2010 8:41) Added new observation of PREG TST DTE: 10/07/2010 (11/11/2010 8:41) Added new observation of MENSTRL COM: irregular (11/11/2010 8:41) Added new observation of LAST MP: 10/02/2010 (11/11/2010 8:41) Added new observation of BCP AT CONCP: yes (11/11/2010 8:41) Added new observation of PREG TST URN: positive (11/11/2010 8:41) Added new observation of INSTRUCTIONS:  Urine pregnancy is positive today Will draw blood to confirm as she was on birth control pills  Follow up with provider on Friday for re-assesment (11/11/2010 8:41)     History of Present Illness:  Pt into the office because she had a positive pregnacy test at home. She reports that  she was taking her oral contraceptives (except when her menses started) She was not taking any antibiotics or other medications  Pt has 3 children with the youngest being 31 years of age.   Impression & Recommendations:  Problem # 1:  PREGNANCY EXAMINATION OR TEST POSITIVE RESULT (ICD-V72.42)  positive today with urine advised pt not to take any more medications until her next visit  Orders: Urine Pregnancy Test  (96295) T-Pregnancy (Serum), Quant. [phone])  Complete Medication List: 1)  Triamcinolone Acetonide 0.1 % Crea (Triamcinolone acetonide) .... Apply small amount  to any aread two times a day as needed for itching 2)  Pepcid 20 Mg Tabs (Famotidine) .... Take 1 tablet by mouth two times a day 3)  Ortho-cyclen (28) 0.25-35 Mg-mcg Tabs (Norgestimate-eth estradiol) .Marland Kitchen.. 1 tab by mouth daily as directed.   Vital Signs:  Patient profile:   30 year old female Menstrual status:  irregular LMP:     10/02/2010  LMP (date): 10/02/2010 EDC by LMP==> 07/09/2011 EDC 07/09/2011 LMP - Character: normal     Menstrual flow (days): 5 On BCP's at conception: yes Date of + home preg. test: 10/07/2010 Menstrual Status irregular Enter LMP: 10/02/2010 Last PAP Result  Specimen Adequacy: Satisfactory for evaluation.   Interpretation/Result:Negative for intraepithelial Lesion or Malignancy.      Patient Instructions: 1)  Urine pregnancy is positive today 2)  Will draw blood to confirm as she was on birth control pills 3)  Follow up with provider on Friday for re-assesment  Laboratory Results   Urine Tests      Urine HCG: positive    Patient Instructions: 1)  Urine pregnancy is positive  today 2)  Will draw blood to confirm as she was on birth control pills 3)  Follow up with provider on Friday for re-assesment

## 2010-11-17 NOTE — Letter (Signed)
Summary: Handout Printed  Printed Handout:  - Pregnancy - Miscarriage (Spontaneous Abortion)

## 2010-11-17 NOTE — Progress Notes (Signed)
Summary: PREGNANCY TEST   Phone Note Call from Patient   Caller: Patient Summary of Call: PT IS TAKING THE BIRTH CONTROL PILLS AND SHE MISS HER PERIOD AND SHE TOOK A PREGNANCY TEST AND WAS POSITIVE AND SHE WANTS TO KNOW IF SHE CAN COME AND SEE FNP MARTIN OR THE NURSE PLEASE CALL HER @ [phone]  [facility] [place]  Initial call taken by: Cheryll Dessert,  November 09, 2010 9:39 AM  Follow-up for Phone Call        Does she need GYN referral?  Dutch Quint RN  November 09, 2010 5:45 PM   Additional Follow-up for Phone Call Additional follow up Details #1::        Did pt miss any pills?  How was she taking? pt needs a nurse visit - urine pregnancy. Then can refer to GYN. if positive then she needs serum pregnancy to determine more approximate pregnancy.  Additional Follow-up by: [name] FNP,  November 09, 2010 5:53 PM    Additional Follow-up for Phone Call Additional follow up Details #2::    Pt. states she took pills daily as ordered.  Did not miss any doses.  Lab scheduled 11/11/10.  Note placed in schedule to let provider know if positive.  Dutch Quint RN  November 10, 2010 3:11 PM

## 2010-11-17 NOTE — Letter (Signed)
Summary: TRIAGE CALL  TRIAGE CALL   Imported By: Arta Bruce 11/13/2010 11:16:57  _____________________________________________________________________  External Attachment:    Type:   Image     Comment:   External Document

## 2010-11-18 ENCOUNTER — Encounter (INDEPENDENT_AMBULATORY_CARE_PROVIDER_SITE_OTHER): Payer: Self-pay | Admitting: Internal Medicine

## 2010-11-19 ENCOUNTER — Encounter: Payer: Self-pay | Admitting: Nurse Practitioner

## 2010-11-19 ENCOUNTER — Encounter (INDEPENDENT_AMBULATORY_CARE_PROVIDER_SITE_OTHER): Payer: Self-pay | Admitting: Internal Medicine

## 2010-11-19 ENCOUNTER — Encounter (INDEPENDENT_AMBULATORY_CARE_PROVIDER_SITE_OTHER): Payer: Self-pay | Admitting: Nurse Practitioner

## 2010-11-19 LAB — CONVERTED CEMR LAB: hCG, Beta Chain, Quant, S: 39751.1 milliintl units/mL

## 2010-11-24 NOTE — Assessment & Plan Note (Signed)
Summary: Beta Hcg (Quantative)  Nurse Visit   Allergies: No Known Drug Allergies  Orders Added: 1)  Est. Patient Nurse visit [09003] 2)  T-Pregnancy (Serum), Quant. [phone]

## 2010-11-24 NOTE — Assessment & Plan Note (Signed)
Summary: Pregnancy Test Results  Nurse Visit   Vital Signs:  Patient profile:   30 year old female Menstrual status:  irregular Temp:     98.3 degrees F oral Pulse rate:   76 / minute Pulse rhythm:   regular Resp:     24 per minute BP sitting:   112 / 54  (left arm) Cuff size:   regular  Vitals Entered By: Dutch Quint RN (November 19, 2010 9:50 AM)  Primary Care Provider:  [name] FNP  CC:  Pregnancy Test results.  History of Present Illness: Has had a positive serum HCG -- levels are still rising.  Last visit, she stated she wanted to keep pregnancy.  Explained that orange card does not cover abortions, pt. is currently unemployed and has very limited funds.  Arna Medici called clinic and got charges    Review of Systems       Having some spotting, last time yesterday, only on tissue when she wipes.  Denies abdominal pain, having some low back discomfort.  Originally stated that she wanted to continue pregnancy.  Has 3 young children at home and is not currently with the father -- states that she now wants to terminate pregnancy.     Impression & Recommendations:  Problem # 1:  PREGNANCY EXAMINATION OR TEST POSITIVE RESULT (ICD-V72.42) HCG levels elevated, consistent with 5-6 week pregnancy Some spotting, low back discomfort Given paperwork re current pregnancy status and [facility] information Wants to terminate pregnancy Given information from two clinics that can assist Pt. to call and find out what options for payment are available.  Complete Medication List: 1)  Triamcinolone Acetonide 0.1 % Crea (Triamcinolone acetonide) .... Apply small amount  to any aread two times a day as needed for itching 2)  Pepcid 20 Mg Tabs (Famotidine) .... Take 1 tablet by mouth two times a day 3)  Ortho-cyclen (28) 0.25-35 Mg-mcg Tabs (Norgestimate-eth estradiol) .... Hold   Patient Instructions: 1)  You are approximately 5-[redacted] weeks pregnant. 2)  You've been given a handout from the [facility] Department for maternity services.  You've also been given information regarding an abortion clinic and their costs, phone number. 3)  You can check with the Health Department for other options for terminating the pregnancy. 4)  Call if you have any questions or if anything changes.  CC: Pregnancy Test results Is Patient Diabetic? No Pain Assessment Patient in pain? yes     Location: lower back Intensity: 4 Type: numbness  Does patient need assistance? Functional Status Self care Ambulation Normal   Allergies: No Known Drug Allergies  Orders Added: 1)  Est. Patient Level I [81191]

## 2011-01-08 ENCOUNTER — Inpatient Hospital Stay (HOSPITAL_COMMUNITY)
Admission: AD | Admit: 2011-01-08 | Discharge: 2011-01-08 | Disposition: A | Discharge status: Home / Self Care | Payer: Self-pay | Source: Ambulatory Visit | Attending: Obstetrics and Gynecology | Admitting: Obstetrics and Gynecology

## 2011-01-08 ENCOUNTER — Encounter (HOSPITAL_COMMUNITY): Payer: Self-pay | Admitting: Radiology

## 2011-01-08 ENCOUNTER — Inpatient Hospital Stay (HOSPITAL_COMMUNITY): Payer: Self-pay

## 2011-01-08 ENCOUNTER — Other Ambulatory Visit: Payer: Self-pay | Admitting: Obstetrics and Gynecology

## 2011-01-08 DIAGNOSIS — O034 Incomplete spontaneous abortion without complication: Secondary | ICD-10-CM | POA: Insufficient documentation

## 2011-01-08 DIAGNOSIS — R52 Pain, unspecified: Secondary | ICD-10-CM

## 2011-01-08 DIAGNOSIS — R58 Hemorrhage, not elsewhere classified: Secondary | ICD-10-CM

## 2011-01-08 LAB — CBC
Hemoglobin: 13.1 g/dL (ref 12.0–15.0)
MCV: 90.3 fL (ref 78.0–100.0)
Platelets: 212 10*3/uL (ref 150–400)
RBC: 4.22 MIL/uL (ref 3.87–5.11)
WBC: 10.9 10*3/uL — ABNORMAL HIGH (ref 4.0–10.5)

## 2011-01-08 IMAGING — US US OB COMP LESS 14 WK
1 series · 14 of 28 positions shown · non-contrast
Comparison: none

[Series 1: us ob comp less 14 wks · 14 of 51 slices shown]
[im 2/51]
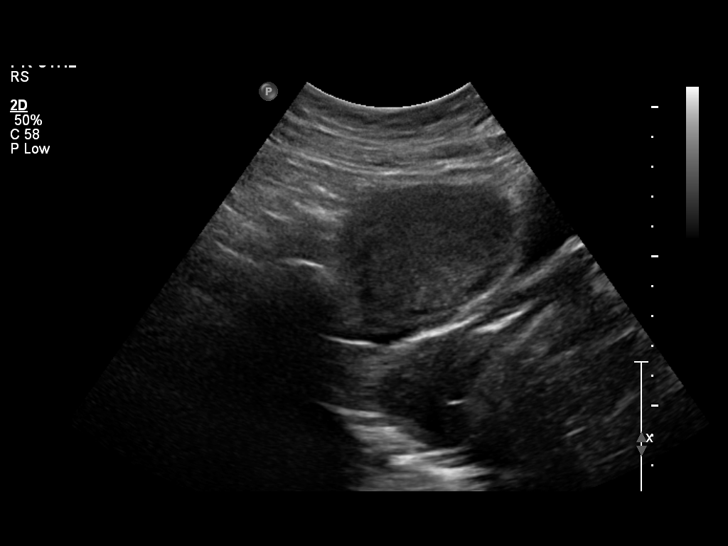
[im 6/51]
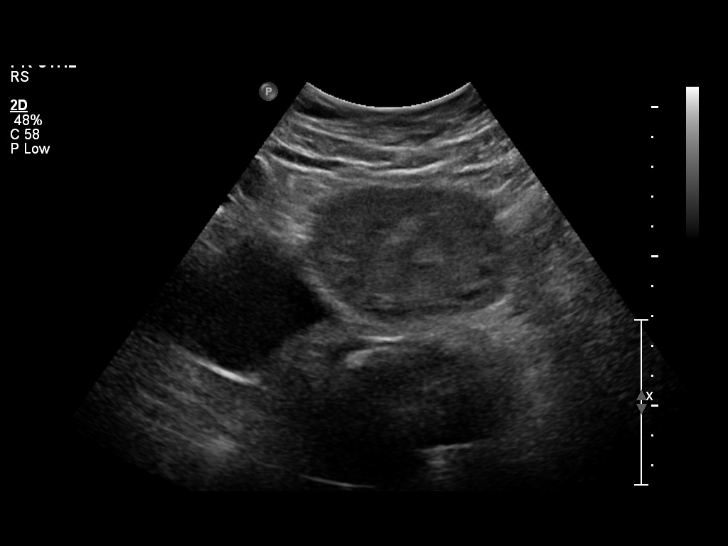
[im 10/51]
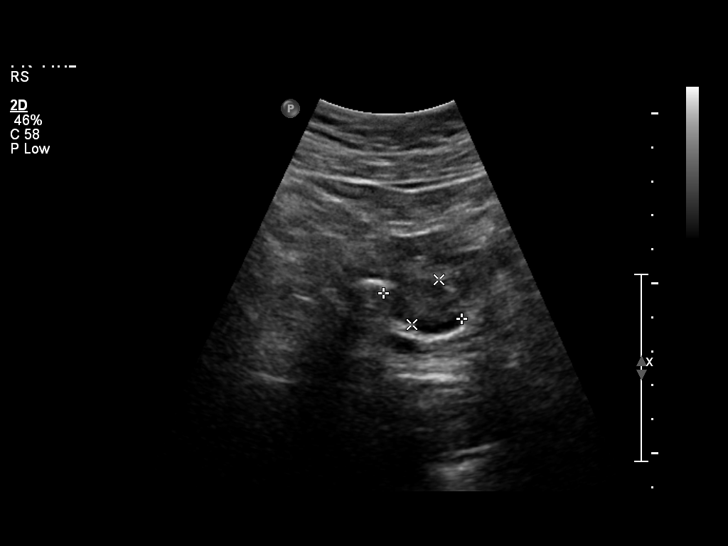
[im 13/51]
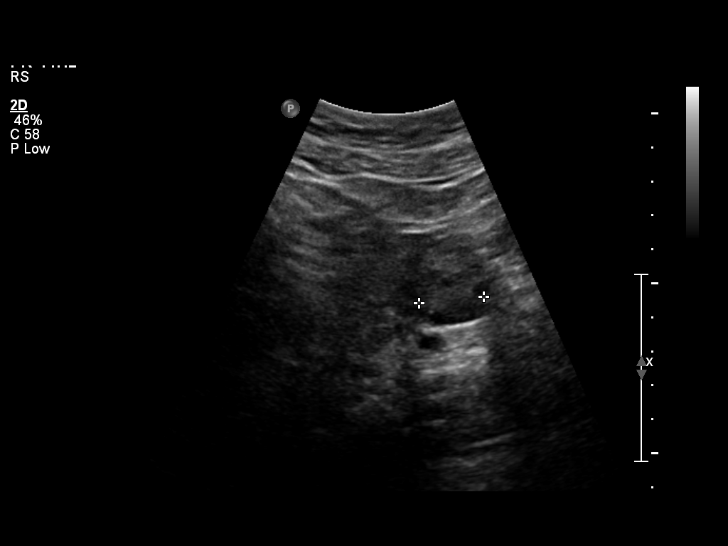
[im 17/51]
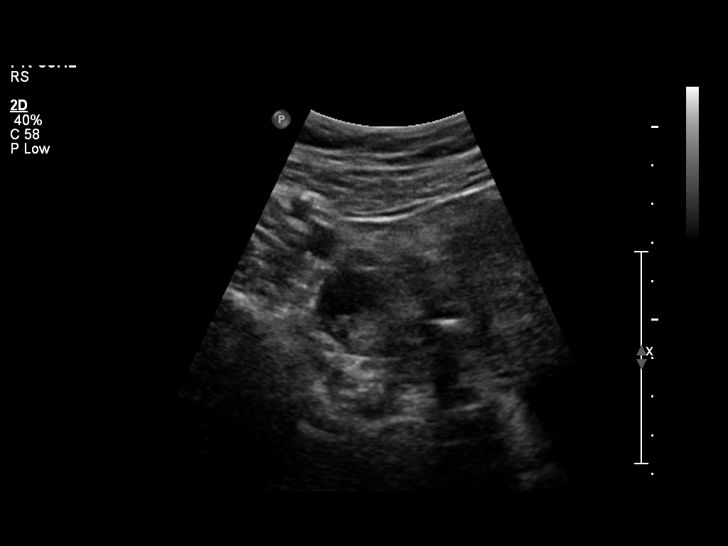
[im 21/51]
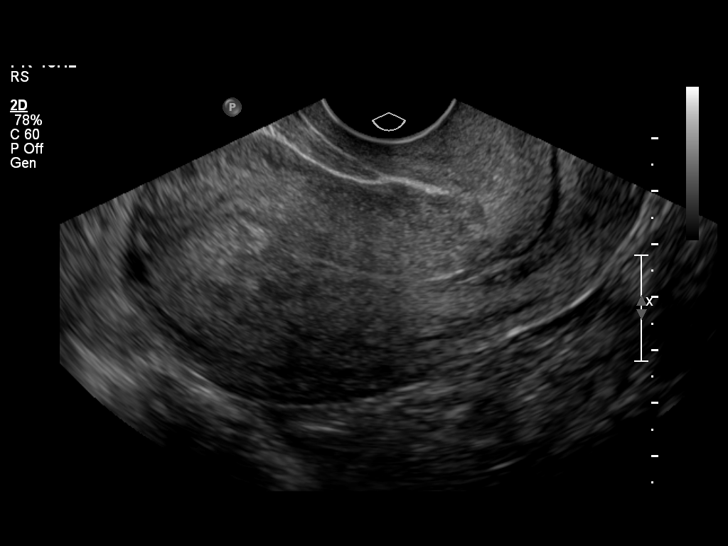
[im 25/51]
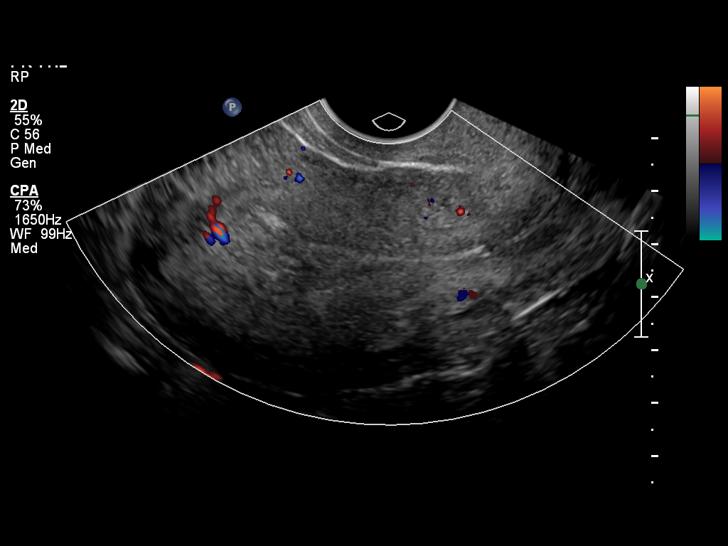
[im 28/51]
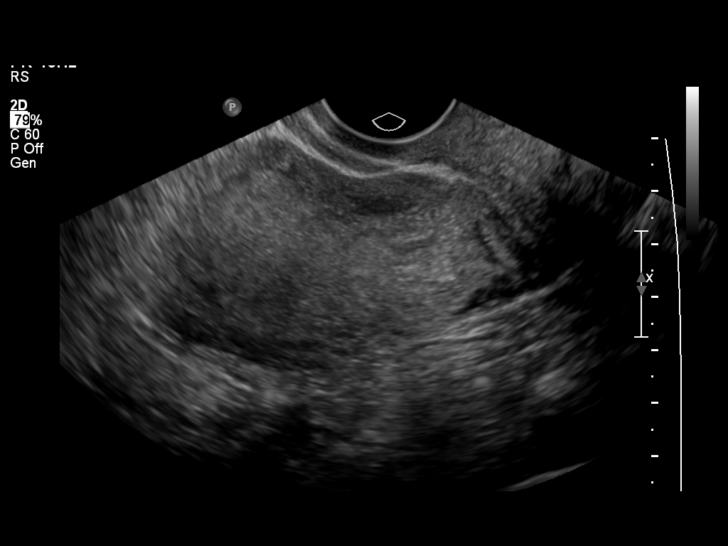
[im 32/51]
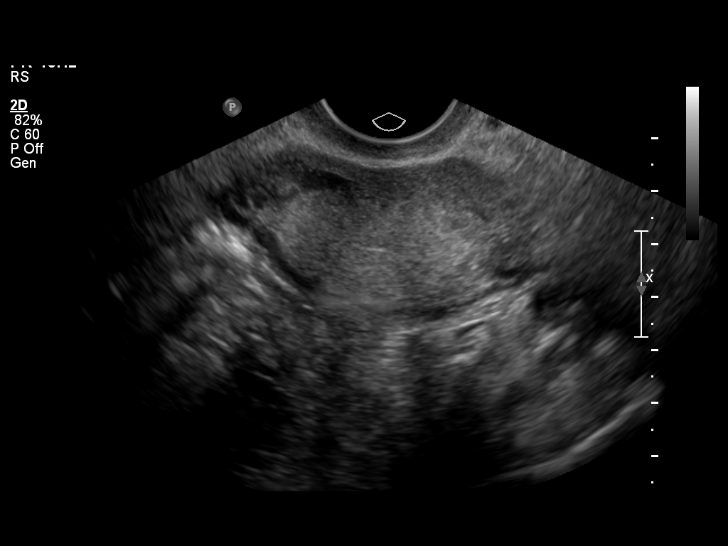
[im 36/51]
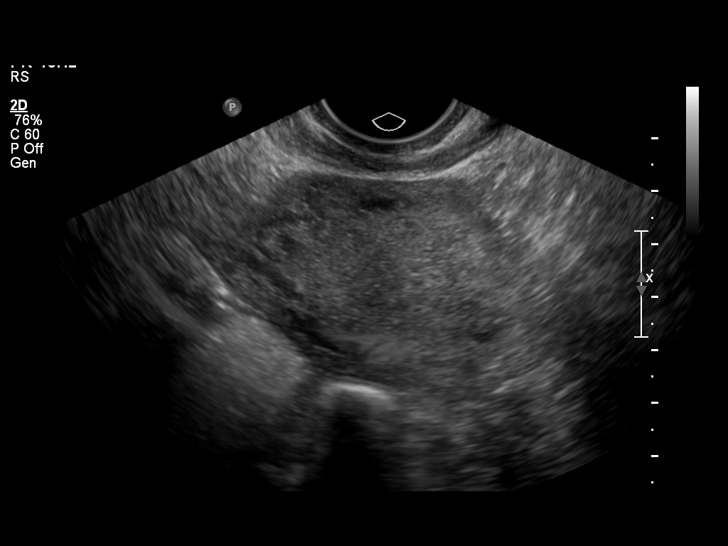
[im 39/51]
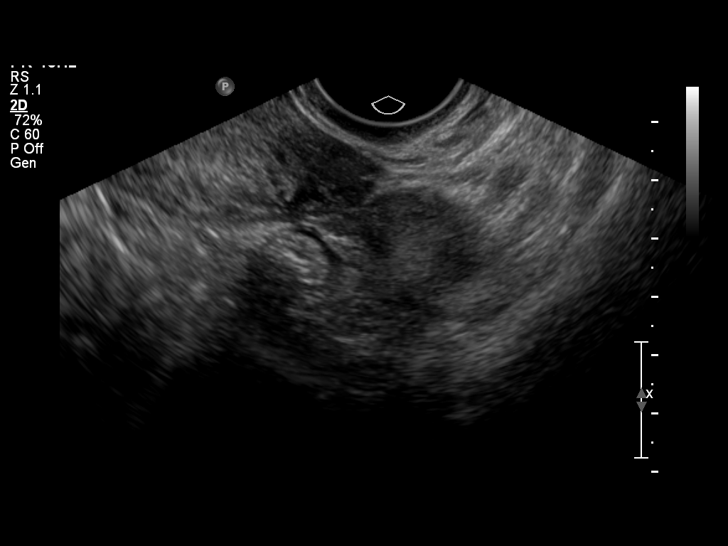
[im 43/51]
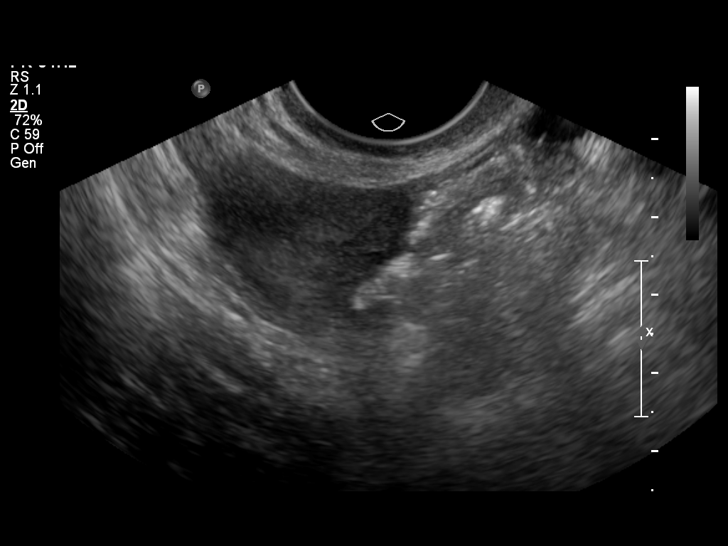
[im 47/51]
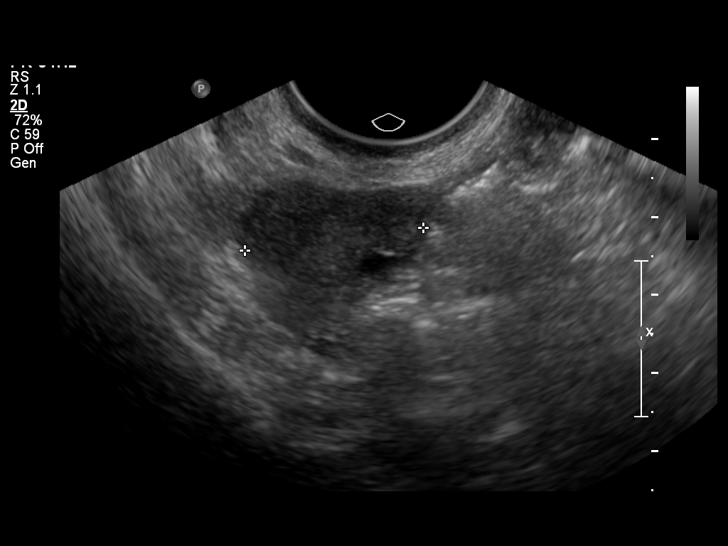
[im 51/51]
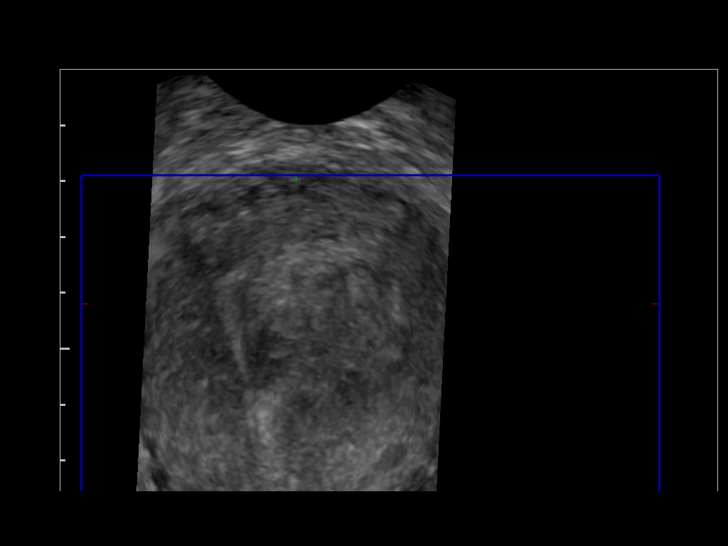

[14 of 28 positions shown; findings below may reference images not displayed]

Canned report from images found in remote index.

Refer to host system for actual result text.

## 2011-01-11 ENCOUNTER — Inpatient Hospital Stay (HOSPITAL_COMMUNITY)
Admission: AD | Admit: 2011-01-11 | Discharge: 2011-01-11 | Disposition: A | Discharge status: Home / Self Care | Payer: Self-pay | Source: Ambulatory Visit | Attending: Obstetrics & Gynecology | Admitting: Obstetrics & Gynecology

## 2011-01-11 DIAGNOSIS — O039 Complete or unspecified spontaneous abortion without complication: Secondary | ICD-10-CM | POA: Insufficient documentation

## 2011-01-11 LAB — CBC
Hemoglobin: 12.7 g/dL (ref 12.0–15.0)
MCV: 91.6 fL (ref 78.0–100.0)
Platelets: 195 10*3/uL (ref 150–400)
RBC: 4.04 MIL/uL (ref 3.87–5.11)
WBC: 5.2 10*3/uL (ref 4.0–10.5)

## 2011-01-11 LAB — HCG, QUANTITATIVE, PREGNANCY: hCG, Beta Chain, Quant, S: 6 m[IU]/mL — ABNORMAL HIGH (ref <5)

## 2011-01-13 ENCOUNTER — Inpatient Hospital Stay (HOSPITAL_COMMUNITY)
Admission: AD | Admit: 2011-01-13 | Discharge: 2011-01-13 | Disposition: A | Discharge status: Home / Self Care | Payer: Self-pay | Source: Ambulatory Visit | Attending: Obstetrics and Gynecology | Admitting: Obstetrics and Gynecology

## 2011-01-13 DIAGNOSIS — IMO0002 Reserved for concepts with insufficient information to code with codable children: Secondary | ICD-10-CM | POA: Insufficient documentation

## 2011-05-27 LAB — URINALYSIS, ROUTINE W REFLEX MICROSCOPIC
Glucose, UA: NEGATIVE
Ketones, ur: NEGATIVE
Nitrite: NEGATIVE
Protein, ur: NEGATIVE
Protein, ur: NEGATIVE
Urobilinogen, UA: 0.2

## 2011-05-27 LAB — POCT URINALYSIS DIP (DEVICE)
Nitrite: NEGATIVE
Nitrite: NEGATIVE
Protein, ur: NEGATIVE
Protein, ur: NEGATIVE
pH: 6
pH: 6

## 2011-05-28 LAB — POCT URINALYSIS DIP (DEVICE)
Ketones, ur: NEGATIVE
Ketones, ur: NEGATIVE
Nitrite: NEGATIVE
Protein, ur: NEGATIVE
Protein, ur: NEGATIVE
pH: 7
pH: 7

## 2011-05-28 LAB — URINALYSIS, ROUTINE W REFLEX MICROSCOPIC
Bilirubin Urine: NEGATIVE
Ketones, ur: NEGATIVE
Protein, ur: NEGATIVE
Urobilinogen, UA: 0.2

## 2011-05-28 LAB — CBC
HCT: 36.3
MCHC: 34.6
MCV: 93
Platelets: 205
RDW: 14.1

## 2011-05-28 LAB — URINE MICROSCOPIC-ADD ON

## 2011-06-14 LAB — URINE MICROSCOPIC-ADD ON

## 2011-06-14 LAB — URINALYSIS, ROUTINE W REFLEX MICROSCOPIC
Glucose, UA: NEGATIVE
Nitrite: NEGATIVE
Specific Gravity, Urine: 1.024
pH: 6.5

## 2011-06-15 LAB — URINALYSIS, ROUTINE W REFLEX MICROSCOPIC
Bilirubin Urine: NEGATIVE
Ketones, ur: NEGATIVE
Nitrite: NEGATIVE
Urobilinogen, UA: 0.2

## 2011-06-15 LAB — URINE MICROSCOPIC-ADD ON

## 2011-07-10 ENCOUNTER — Emergency Department (HOSPITAL_COMMUNITY)
Admission: EM | Admit: 2011-07-10 | Discharge: 2011-07-10 | Disposition: A | Discharge status: Home / Self Care | Payer: Self-pay | Attending: Emergency Medicine | Admitting: Emergency Medicine

## 2011-07-10 DIAGNOSIS — F329 Major depressive disorder, single episode, unspecified: Secondary | ICD-10-CM | POA: Insufficient documentation

## 2011-07-10 DIAGNOSIS — F3289 Other specified depressive episodes: Secondary | ICD-10-CM | POA: Insufficient documentation

## 2011-07-10 DIAGNOSIS — R110 Nausea: Secondary | ICD-10-CM | POA: Insufficient documentation

## 2011-07-10 DIAGNOSIS — R42 Dizziness and giddiness: Secondary | ICD-10-CM | POA: Insufficient documentation

## 2011-07-10 DIAGNOSIS — G43909 Migraine, unspecified, not intractable, without status migrainosus: Secondary | ICD-10-CM | POA: Insufficient documentation

## 2011-07-10 LAB — URINALYSIS, ROUTINE W REFLEX MICROSCOPIC
Glucose, UA: NEGATIVE mg/dL
Specific Gravity, Urine: 1.017 (ref 1.005–1.030)

## 2011-07-10 LAB — URINE MICROSCOPIC-ADD ON

## 2011-07-10 LAB — POCT PREGNANCY, URINE: Preg Test, Ur: NEGATIVE

## 2012-09-06 NOTE — L&D Delivery Note (Signed)
Delivery Note At 6:18 PM a viable female was delivered via  (Presentation: ;  ).  APGAR: , ; weight .   Placenta status: , .  Cord:  with the following complications: .  Cord pH: not done  Anesthesia:   Episiotomy:  Lacerations:  Suture Repair: 2.0 Est. Blood Loss (mL):   Mom to postpartum.  Baby to nursery-stable.  Antaniya Venuti A 05/23/2013, 6:24 PM

## 2012-10-10 ENCOUNTER — Inpatient Hospital Stay (HOSPITAL_COMMUNITY)
Admission: AD | Admit: 2012-10-10 | Discharge: 2012-10-10 | Disposition: A | Discharge status: Home / Self Care | Payer: MEDICAID | Source: Ambulatory Visit | Attending: Family Medicine | Admitting: Family Medicine

## 2012-10-10 ENCOUNTER — Encounter (HOSPITAL_COMMUNITY): Payer: Self-pay | Admitting: *Deleted

## 2012-10-10 ENCOUNTER — Inpatient Hospital Stay (HOSPITAL_COMMUNITY): Payer: Self-pay

## 2012-10-10 DIAGNOSIS — O2340 Unspecified infection of urinary tract in pregnancy, unspecified trimester: Secondary | ICD-10-CM

## 2012-10-10 DIAGNOSIS — R109 Unspecified abdominal pain: Secondary | ICD-10-CM

## 2012-10-10 DIAGNOSIS — N390 Urinary tract infection, site not specified: Secondary | ICD-10-CM

## 2012-10-10 DIAGNOSIS — O2390 Unspecified genitourinary tract infection in pregnancy, unspecified trimester: Secondary | ICD-10-CM

## 2012-10-10 LAB — CBC WITH DIFFERENTIAL/PLATELET
Basophils Relative: 0 % (ref 0–1)
Hemoglobin: 13.3 g/dL (ref 12.0–15.0)
MCHC: 34.4 g/dL (ref 30.0–36.0)
Monocytes Relative: 8 % (ref 3–12)
Neutro Abs: 5 10*3/uL (ref 1.7–7.7)
Neutrophils Relative %: 65 % (ref 43–77)
RBC: 4.3 MIL/uL (ref 3.87–5.11)

## 2012-10-10 LAB — URINALYSIS, ROUTINE W REFLEX MICROSCOPIC
Glucose, UA: NEGATIVE mg/dL
pH: 5.5 (ref 5.0–8.0)

## 2012-10-10 LAB — COMPREHENSIVE METABOLIC PANEL
ALT: 44 U/L — ABNORMAL HIGH (ref 0–35)
AST: 30 U/L (ref 0–37)
Albumin: 3.8 g/dL (ref 3.5–5.2)
Alkaline Phosphatase: 59 U/L (ref 39–117)
BUN: 11 mg/dL (ref 6–23)
Chloride: 101 mEq/L (ref 96–112)
Potassium: 3.6 mEq/L (ref 3.5–5.1)
Sodium: 137 mEq/L (ref 135–145)
Total Bilirubin: 0.9 mg/dL (ref 0.3–1.2)

## 2012-10-10 LAB — WET PREP, GENITAL: Clue Cells Wet Prep HPF POC: NONE SEEN

## 2012-10-10 LAB — URINE MICROSCOPIC-ADD ON

## 2012-10-10 LAB — POCT PREGNANCY, URINE: Preg Test, Ur: POSITIVE — AB

## 2012-10-10 IMAGING — US US OB COMP LESS 14 WK
1 series · 14 of 28 positions shown · non-contrast
Comparison: none

[Series 1: us ob comp less 14 wks · 39 acquisitions, 14 frames shown]
[im 2/39]
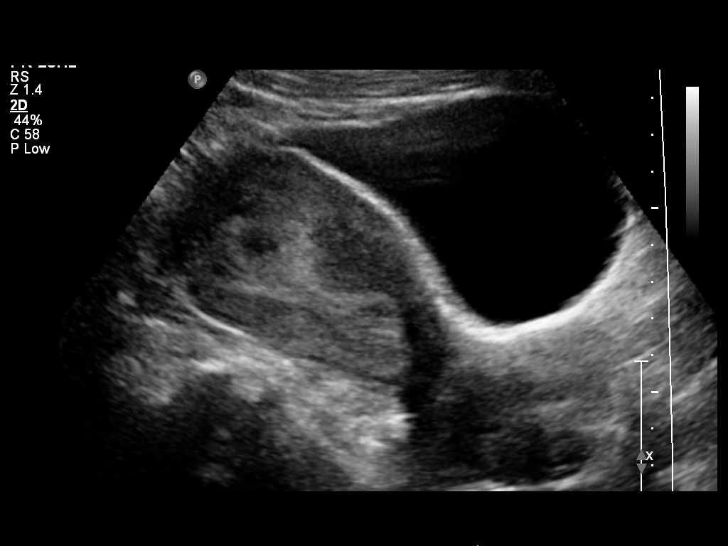
[im 5/39]
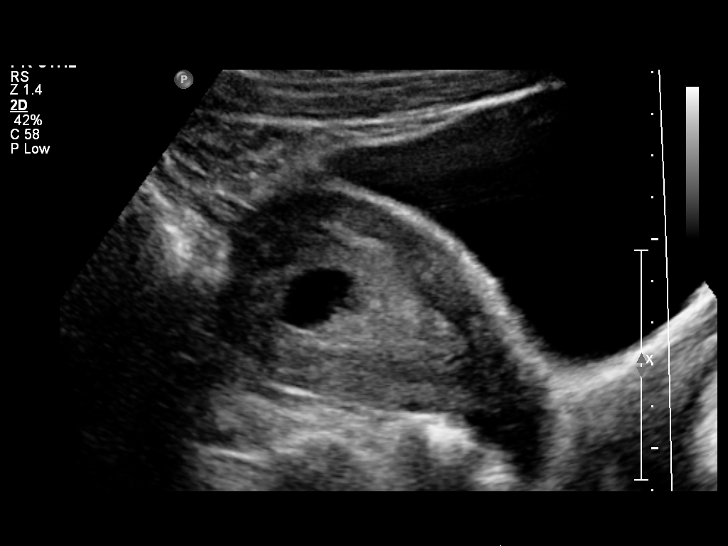
[im 8/39]
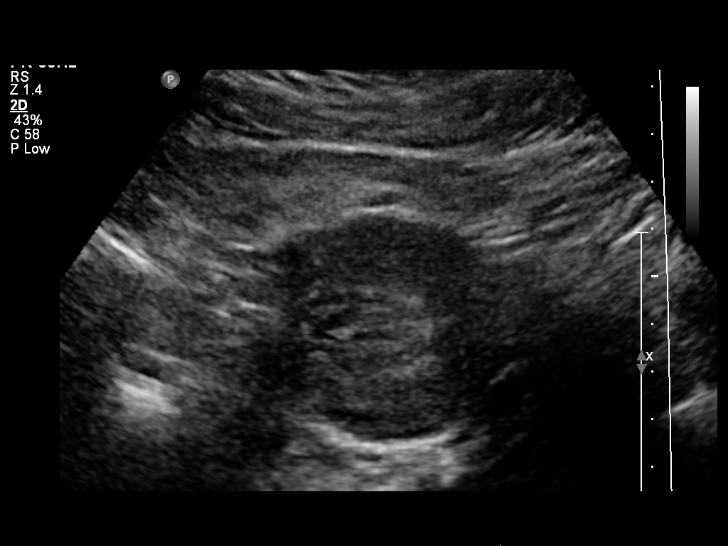
[im 10/39]
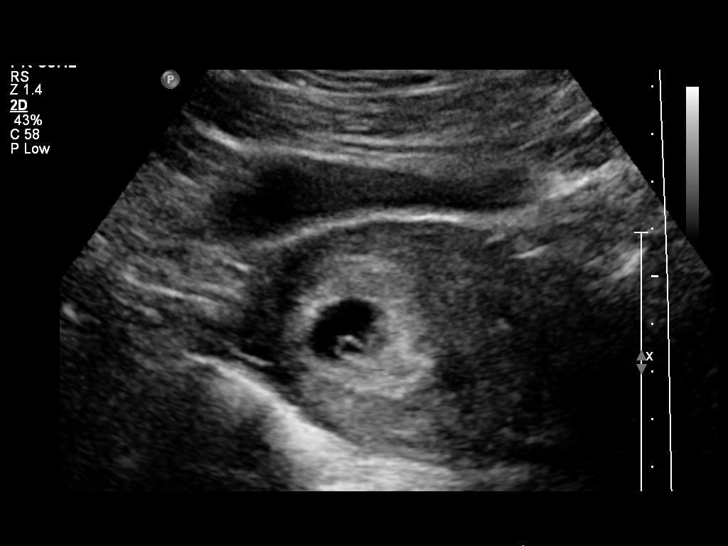
[im 13/39]
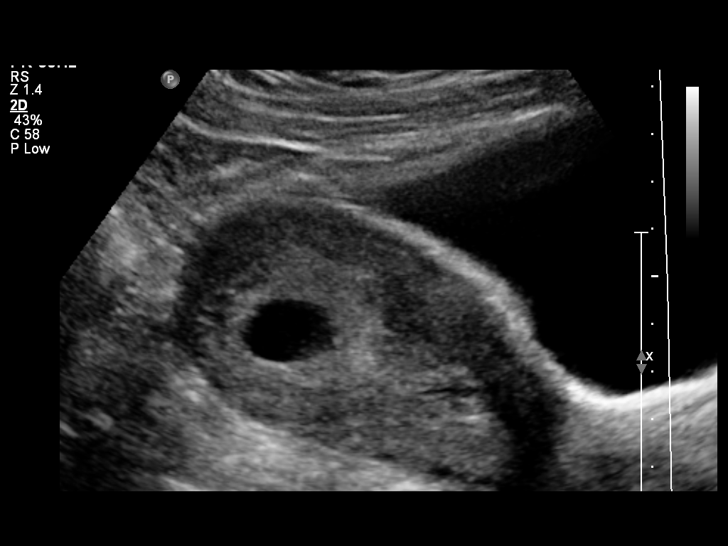
[im 16/39]
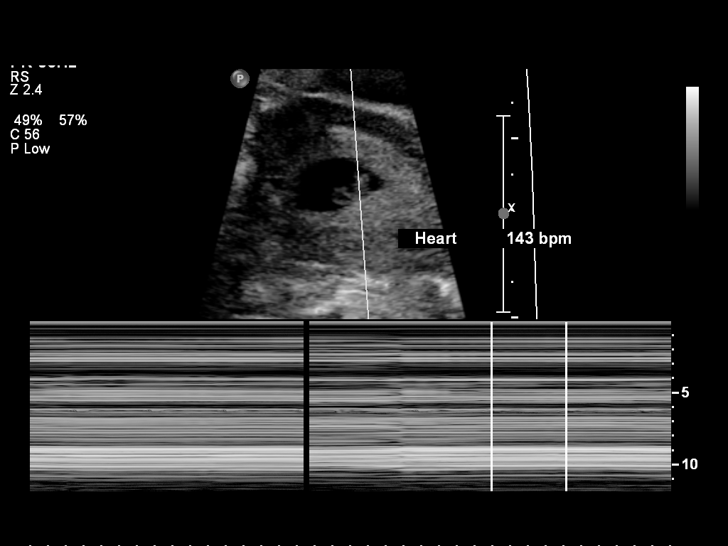
[im 19/39]
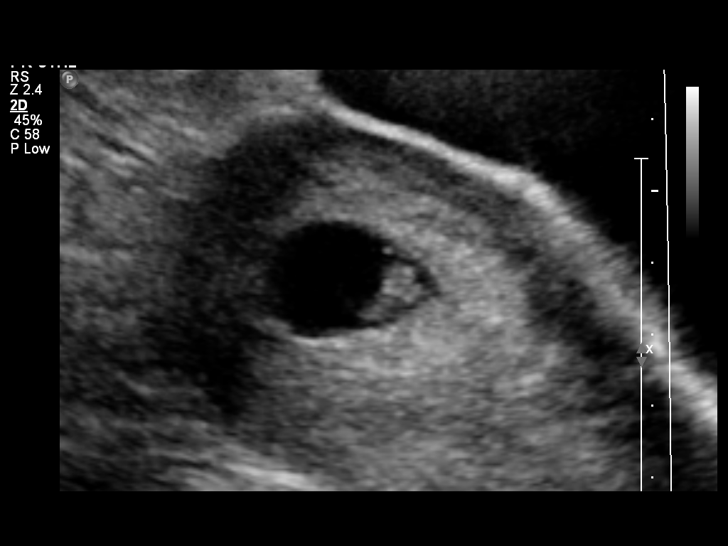
[im 22/39]
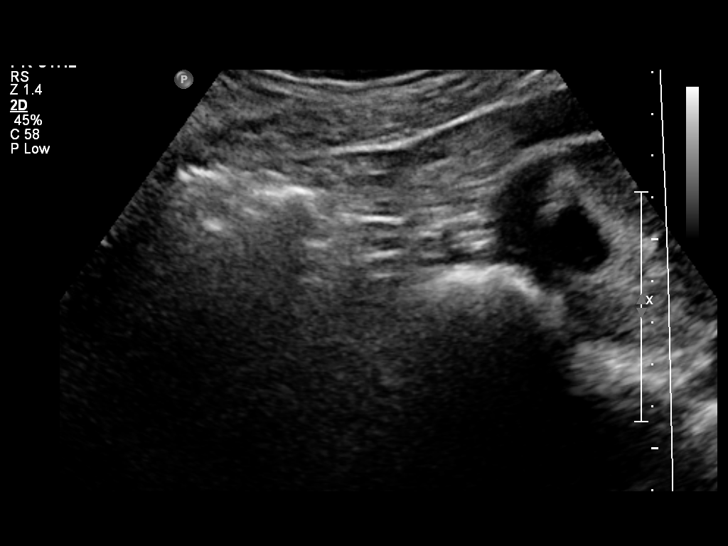
[im 24/39]
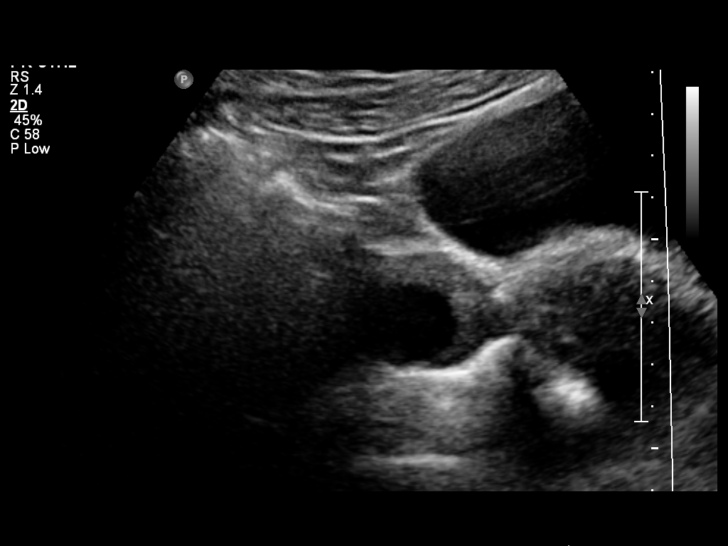
[im 27/39]
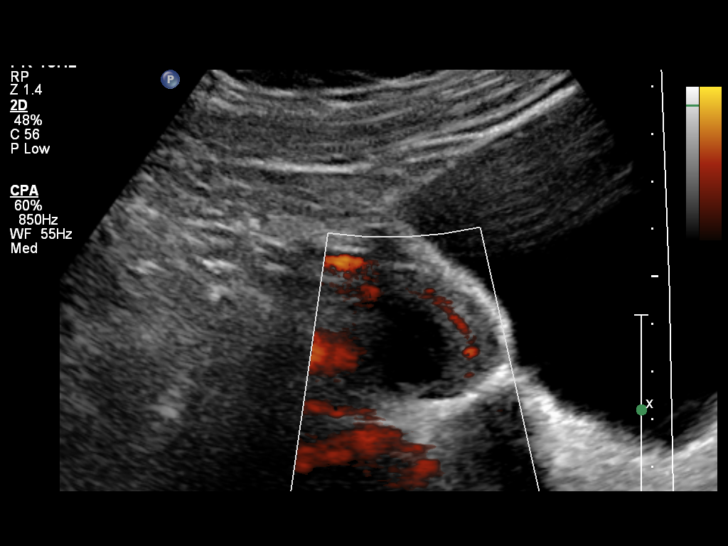
[im 30/39]
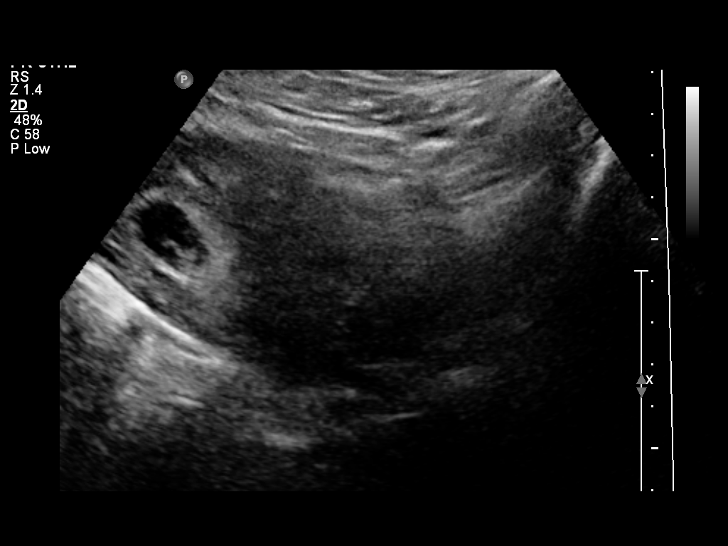
[im 33/39]
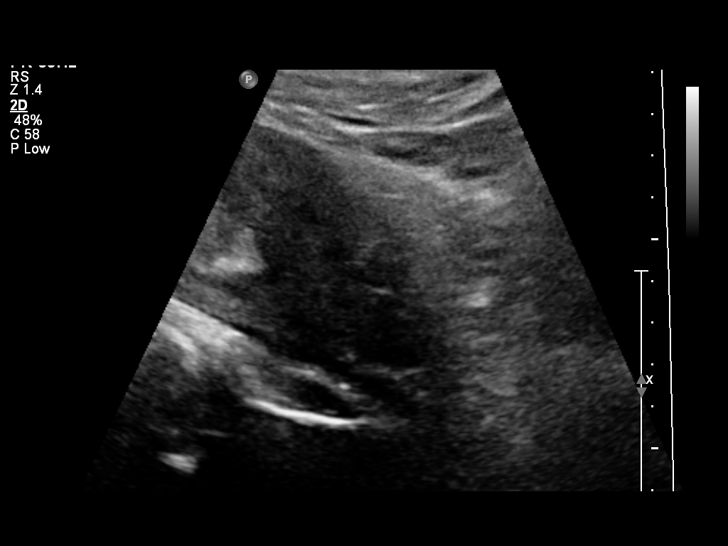
[im 36/39]
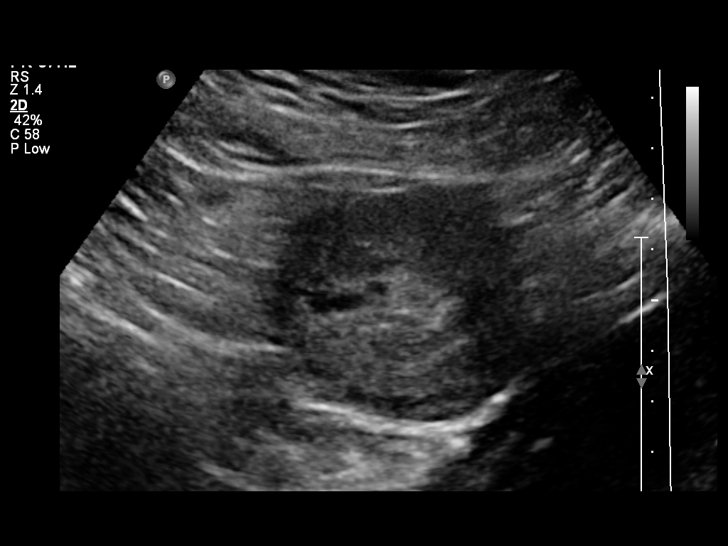
[im 39/39]
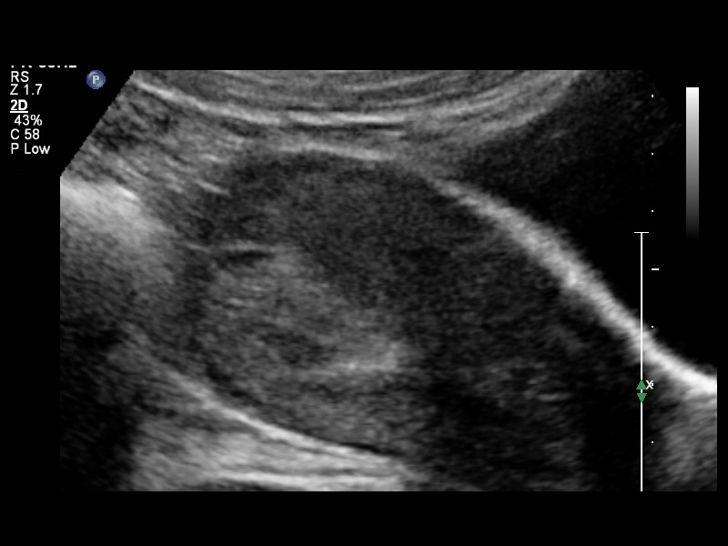

[14 of 28 positions shown; findings below may reference images not displayed]

OBSTETRICS REPORT
                      (Signed Final [DATE] [DATE])

Service(s) Provided

 US OB COMP LESS 14 WKS                                76801.0
Indications

 Pain - Abdominal/Pelvic
Fetal Evaluation

 Num Of Fetuses:    1
 Preg. Location:    Intrauterine
 Gest. Sac:         Intrauterine, small
                    subchorionic bleed
 Yolk Sac:          Visualized
 Fetal Pole:        Visualized
 Fetal Heart Rate:  143                         bpm
 Cardiac Activity:  Observed
Biometry

 CRL:     10.6  mm    G. Age:   7w 1d                  EDD:   [DATE]
Gestational Age

 LMP:           7w 2d        Date:   [DATE]                 EDD:   [DATE]
 Best:          7w 2d     Det. By:   LMP  ([DATE])          EDD:   [DATE]
Cervix Uterus Adnexa

 Cervix:       Normal appearance by transabdominal scan.
 Uterus:       No abnormality visualized.
 Cul De Sac:   No free fluid seen.
 Left Ovary:   Within normal limits.
 Right Ovary:  Within normal limits. Small corpus luteum noted.

 Adnexa:     No abnormality visualized.
Impression

 Single living IUP.  US EGA is concordant with LMP.
 Small subchorionic hemorrhage noted.

## 2012-10-10 MED ORDER — NITROFURANTOIN MONOHYD MACRO 100 MG PO CAPS: 100.0000 mg | ORAL_CAPSULE | Freq: Two times a day (BID) | ORAL | Status: DC

## 2012-10-10 NOTE — MAU Note (Signed)
Pt states lower abd pain for 2 days, intermittent. Denies abnormal vaginal discharge or bleeding.

## 2012-10-10 NOTE — MAU Provider Note (Signed)
History     CSN: [id number]  Arrival date and time: 10/10/12 4401   First Provider Initiated Contact with Patient 10/10/12 [phone]      Chief Complaint  Patient presents with  . Abdominal Pain   HPI Amber Travis is a 32 y.o. female @ [redacted]w[redacted]d gestation with abdominal pain. The pain started last night. She describes the pain as cramping. She rates the pain as 4/10. Associated symptoms include nausea and light headedness.  Denies vaginal bleeding or discharge. Last sexual intercourse 3 days ago. Last pap smear one year ago and was normal at [facility]. The history was provided by the patient using the hospital Spanish Translator.   OB History    Grav Para Term Preterm Abortions TAB SAB Ect Mult Living   5 3   1     3       Past Medical History  Diagnosis Date  . No pertinent past medical history     Past Surgical History  Procedure Date  . No past surgeries     History reviewed. No pertinent family history.  History  Substance Use Topics  . Smoking status: Never Smoker   . Smokeless tobacco: Never Used  . Alcohol Use: No    Allergies: No Known Allergies  No prescriptions prior to admission    Review of Systems  Constitutional: Negative for fever, chills and weight loss.  HENT: Negative for ear pain, nosebleeds, congestion, sore throat and neck pain.   Eyes: Negative for blurred vision, double vision, photophobia and pain.  Respiratory: Negative for cough and wheezing.   Cardiovascular: Negative for chest pain, palpitations and leg swelling.  Gastrointestinal: Positive for nausea and abdominal pain. Negative for heartburn, vomiting, diarrhea and constipation.  Genitourinary: Positive for dysuria, urgency and frequency.  Musculoskeletal: Positive for back pain. Negative for myalgias.  Skin: Negative for itching and rash.  Neurological: Positive for dizziness. Negative for sensory change, speech change, seizures, weakness and headaches.  Endo/Heme/Allergies: Does  not bruise/bleed easily.  Psychiatric/Behavioral: Negative for depression and substance abuse. The patient is nervous/anxious. The patient does not have insomnia.    Blood pressure 105/63, pulse 88, temperature 98.4 F (36.9 C), temperature source Oral, resp. rate 16, height 4\' 11"  (1.499 m), weight 168 lb 2 oz (76.261 kg), last menstrual period 08/20/2012.  Physical Exam  Nursing note and vitals reviewed. Constitutional: She is oriented to person, place, and time. She appears well-developed and well-nourished. No distress.  HENT:  Head: Normocephalic and atraumatic.  Eyes: EOM are normal.  Neck: Neck supple.  Cardiovascular: Normal rate.   Respiratory: Effort normal.  GI: Soft. There is no tenderness.  Genitourinary:       External genitalia without lesions. White discharge vaginal vault. Cervix long, closed, no CMT, mildly tender bilateral adnexa. Uterus approximately 8 week size.  Musculoskeletal: Normal range of motion.  Neurological: She is alert and oriented to person, place, and time.  Skin: Skin is warm and dry.  Psychiatric: She has a normal mood and affect. Her behavior is normal. Judgment and thought content normal.    Procedures     Assessment: 32 y.o. female @ [redacted]w[redacted]d gestation with abdominal pain   Viable IUP   UTI in pregnancy  Plan:  Treat UTI   Pregnancy verification letter   Start prenatal care I have reviewed this patient's vital signs, nurses notes, appropriate labs and imaging.     Medication List     As of 10/12/2012  9:27 PM  START taking these medications         nitrofurantoin (macrocrystal-monohydrate) 100 MG capsule   Commonly known as: MACROBID   Take 1 capsule (100 mg total) by mouth 2 (two) times daily.      CONTINUE taking these medications         prenatal multivitamin Tabs          Where to get your medications    These are the prescriptions that you need to pick up.   You may get these medications from any pharmacy.          nitrofurantoin (macrocrystal-monohydrate) 100 MG capsule             NEESE,HOPE, RN, FNP, [facility] 10/12/2012, 9:27 PM

## 2012-10-11 LAB — GC/CHLAMYDIA PROBE AMP: GC Probe RNA: NEGATIVE

## 2012-10-13 NOTE — MAU Provider Note (Signed)
Chart reviewed and agree with management and plan.  

## 2012-10-31 LAB — CYTOLOGY - PAP: Pap: NEGATIVE

## 2013-05-13 ENCOUNTER — Encounter (HOSPITAL_COMMUNITY): Payer: Self-pay | Admitting: *Deleted

## 2013-05-13 ENCOUNTER — Inpatient Hospital Stay (HOSPITAL_COMMUNITY)
Admission: AD | Admit: 2013-05-13 | Discharge: 2013-05-13 | Disposition: A | Discharge status: Home / Self Care | Payer: Self-pay | Source: Ambulatory Visit | Attending: Obstetrics | Admitting: Obstetrics

## 2013-05-13 DIAGNOSIS — O479 False labor, unspecified: Secondary | ICD-10-CM | POA: Insufficient documentation

## 2013-05-13 NOTE — MAU Note (Signed)
Pt presents with complaints of contractions for 2 days. Denies any bleeding or LOF.

## 2013-05-23 ENCOUNTER — Encounter (HOSPITAL_COMMUNITY): Payer: Self-pay | Admitting: *Deleted

## 2013-05-23 ENCOUNTER — Inpatient Hospital Stay (HOSPITAL_COMMUNITY)
Admission: AD | Admit: 2013-05-23 | Discharge: 2013-05-24 | DRG: 775 | Disposition: A | Discharge status: Home / Self Care | Payer: Medicaid Other | Source: Ambulatory Visit | Attending: Obstetrics | Admitting: Obstetrics

## 2013-05-23 ENCOUNTER — Encounter (HOSPITAL_COMMUNITY): Payer: Self-pay | Admitting: Anesthesiology

## 2013-05-23 ENCOUNTER — Inpatient Hospital Stay (HOSPITAL_COMMUNITY): Payer: Medicaid Other | Admitting: Anesthesiology

## 2013-05-23 LAB — RAPID HIV SCREEN (WH-MAU): Rapid HIV Screen: NONREACTIVE

## 2013-05-23 LAB — OB RESULTS CONSOLE ANTIBODY SCREEN: Antibody Screen: NEGATIVE

## 2013-05-23 LAB — TYPE AND SCREEN
ABO/RH(D): O POS
Antibody Screen: NEGATIVE

## 2013-05-23 LAB — CBC
HCT: 38.7 % (ref 36.0–46.0)
MCV: 88.8 fL (ref 78.0–100.0)
Platelets: 177 10*3/uL (ref 150–400)
RBC: 4.36 MIL/uL (ref 3.87–5.11)
RDW: 13.8 % (ref 11.5–15.5)
WBC: 9.4 10*3/uL (ref 4.0–10.5)

## 2013-05-23 MED ORDER — OXYTOCIN BOLUS FROM INFUSION: 500.0000 mL | INTRAVENOUS | Status: DC

## 2013-05-23 MED ORDER — CITRIC ACID-SODIUM CITRATE 334-500 MG/5ML PO SOLN: 30.0000 mL | ORAL | Status: DC | PRN

## 2013-05-23 MED ORDER — SENNOSIDES-DOCUSATE SODIUM 8.6-50 MG PO TABS
2.0000 | ORAL_TABLET | ORAL | Status: DC
  Administered 2013-05-24: 2 via ORAL

## 2013-05-23 MED ORDER — ONDANSETRON HCL 4 MG/2ML IJ SOLN: 4.0000 mg | Freq: Four times a day (QID) | INTRAMUSCULAR | Status: DC | PRN

## 2013-05-23 MED ORDER — PRENATAL MULTIVITAMIN CH
1.0000 | ORAL_TABLET | Freq: Every day | ORAL | Status: DC
  Administered 2013-05-23 – 2013-05-24 (×2): 1 via ORAL
  Filled 2013-05-23 (×2): qty 1

## 2013-05-23 MED ORDER — BENZOCAINE-MENTHOL 20-0.5 % EX AERO: 1.0000 "application " | INHALATION_SPRAY | CUTANEOUS | Status: DC | PRN

## 2013-05-23 MED ORDER — ONDANSETRON HCL 4 MG/2ML IJ SOLN: 4.0000 mg | INTRAMUSCULAR | Status: DC | PRN

## 2013-05-23 MED ORDER — OXYCODONE-ACETAMINOPHEN 5-325 MG PO TABS
1.0000 | ORAL_TABLET | ORAL | Status: DC | PRN
  Filled 2013-05-23: qty 1

## 2013-05-23 MED ORDER — EPHEDRINE 5 MG/ML INJ
10.0000 mg | INTRAVENOUS | Status: DC | PRN
  Filled 2013-05-23: qty 4
  Filled 2013-05-23: qty 2

## 2013-05-23 MED ORDER — PRENATAL MULTIVITAMIN CH: 1.0000 | ORAL_TABLET | Freq: Every day | ORAL | Status: DC

## 2013-05-23 MED ORDER — OXYTOCIN 40 UNITS IN LACTATED RINGERS INFUSION - SIMPLE MED: 62.5000 mL/h | INTRAVENOUS | Status: DC

## 2013-05-23 MED ORDER — INFLUENZA VAC SPLIT QUAD 0.5 ML IM SUSP
0.5000 mL | INTRAMUSCULAR | Status: AC
  Administered 2013-05-24: 0.5 mL via INTRAMUSCULAR
  Filled 2013-05-23: qty 0.5

## 2013-05-23 MED ORDER — PHENYLEPHRINE 40 MCG/ML (10ML) SYRINGE FOR IV PUSH (FOR BLOOD PRESSURE SUPPORT)
80.0000 ug | PREFILLED_SYRINGE | INTRAVENOUS | Status: DC | PRN
  Filled 2013-05-23: qty 2

## 2013-05-23 MED ORDER — FLEET ENEMA 7-19 GM/118ML RE ENEM: 1.0000 | ENEMA | Freq: Once | RECTAL | Status: DC

## 2013-05-23 MED ORDER — ZOLPIDEM TARTRATE 5 MG PO TABS: 5.0000 mg | ORAL_TABLET | Freq: Every evening | ORAL | Status: DC | PRN

## 2013-05-23 MED ORDER — FENTANYL 2.5 MCG/ML BUPIVACAINE 1/10 % EPIDURAL INFUSION (WH - ANES)
14.0000 mL/h | INTRAMUSCULAR | Status: DC | PRN
  Administered 2013-05-23: 14 mL/h via EPIDURAL
  Filled 2013-05-23: qty 125

## 2013-05-23 MED ORDER — TERBUTALINE SULFATE 1 MG/ML IJ SOLN: 0.2500 mg | Freq: Once | INTRAMUSCULAR | Status: DC | PRN

## 2013-05-23 MED ORDER — SIMETHICONE 80 MG PO CHEW: 80.0000 mg | CHEWABLE_TABLET | ORAL | Status: DC | PRN

## 2013-05-23 MED ORDER — DIPHENHYDRAMINE HCL 50 MG/ML IJ SOLN: 12.5000 mg | INTRAMUSCULAR | Status: DC | PRN

## 2013-05-23 MED ORDER — LANOLIN HYDROUS EX OINT: TOPICAL_OINTMENT | CUTANEOUS | Status: DC | PRN

## 2013-05-23 MED ORDER — FERROUS SULFATE 325 (65 FE) MG PO TABS
325.0000 mg | ORAL_TABLET | Freq: Two times a day (BID) | ORAL | Status: DC
  Administered 2013-05-24 (×2): 325 mg via ORAL
  Filled 2013-05-23 (×2): qty 1

## 2013-05-23 MED ORDER — DIBUCAINE 1 % RE OINT: 1.0000 "application " | TOPICAL_OINTMENT | RECTAL | Status: DC | PRN

## 2013-05-23 MED ORDER — OXYTOCIN 40 UNITS IN LACTATED RINGERS INFUSION - SIMPLE MED
1.0000 m[IU]/min | INTRAVENOUS | Status: DC
  Administered 2013-05-23: 666 m[IU]/min via INTRAVENOUS
  Administered 2013-05-23: 2 m[IU]/min via INTRAVENOUS
  Filled 2013-05-23: qty 1000

## 2013-05-23 MED ORDER — TETANUS-DIPHTH-ACELL PERTUSSIS 5-2.5-18.5 LF-MCG/0.5 IM SUSP
0.5000 mL | Freq: Once | INTRAMUSCULAR | Status: AC
  Administered 2013-05-24: 0.5 mL via INTRAMUSCULAR
  Filled 2013-05-23: qty 0.5

## 2013-05-23 MED ORDER — LIDOCAINE HCL (PF) 1 % IJ SOLN
INTRAMUSCULAR | Status: DC | PRN
  Administered 2013-05-23 (×2): 5 mL

## 2013-05-23 MED ORDER — IBUPROFEN 600 MG PO TABS
600.0000 mg | ORAL_TABLET | Freq: Four times a day (QID) | ORAL | Status: DC | PRN
  Filled 2013-05-23 (×2): qty 1

## 2013-05-23 MED ORDER — DIPHENHYDRAMINE HCL 25 MG PO CAPS: 25.0000 mg | ORAL_CAPSULE | Freq: Four times a day (QID) | ORAL | Status: DC | PRN

## 2013-05-23 MED ORDER — OXYCODONE-ACETAMINOPHEN 5-325 MG PO TABS
1.0000 | ORAL_TABLET | ORAL | Status: DC | PRN
  Administered 2013-05-23: 1 via ORAL

## 2013-05-23 MED ORDER — WITCH HAZEL-GLYCERIN EX PADS: 1.0000 "application " | MEDICATED_PAD | CUTANEOUS | Status: DC | PRN

## 2013-05-23 MED ORDER — IBUPROFEN 600 MG PO TABS
600.0000 mg | ORAL_TABLET | Freq: Four times a day (QID) | ORAL | Status: DC
  Administered 2013-05-24 (×4): 600 mg via ORAL
  Filled 2013-05-23 (×2): qty 1

## 2013-05-23 MED ORDER — PHENYLEPHRINE 40 MCG/ML (10ML) SYRINGE FOR IV PUSH (FOR BLOOD PRESSURE SUPPORT)
80.0000 ug | PREFILLED_SYRINGE | INTRAVENOUS | Status: DC | PRN
  Filled 2013-05-23: qty 5
  Filled 2013-05-23: qty 2

## 2013-05-23 MED ORDER — EPHEDRINE 5 MG/ML INJ
10.0000 mg | INTRAVENOUS | Status: DC | PRN
  Administered 2013-05-23: 10 mg via INTRAVENOUS
  Filled 2013-05-23: qty 2

## 2013-05-23 MED ORDER — ACETAMINOPHEN 325 MG PO TABS
650.0000 mg | ORAL_TABLET | ORAL | Status: DC | PRN
  Administered 2013-05-23 (×2): 650 mg via ORAL
  Filled 2013-05-23 (×2): qty 2

## 2013-05-23 MED ORDER — ONDANSETRON HCL 4 MG PO TABS: 4.0000 mg | ORAL_TABLET | ORAL | Status: DC | PRN

## 2013-05-23 MED ORDER — LACTATED RINGERS IV SOLN: 500.0000 mL | INTRAVENOUS | Status: DC | PRN

## 2013-05-23 MED ORDER — LACTATED RINGERS IV SOLN
INTRAVENOUS | Status: DC
  Administered 2013-05-23: 17:00:00 via INTRAVENOUS

## 2013-05-23 MED ORDER — LIDOCAINE HCL (PF) 1 % IJ SOLN
30.0000 mL | INTRAMUSCULAR | Status: DC | PRN
  Filled 2013-05-23 (×2): qty 30

## 2013-05-23 MED ORDER — LACTATED RINGERS IV SOLN: 500.0000 mL | Freq: Once | INTRAVENOUS | Status: DC

## 2013-05-23 NOTE — Progress Notes (Signed)
eda royal, spanish interpreter at bedside for epidural

## 2013-05-23 NOTE — H&P (Signed)
This is Dr. Francoise Ceo dictating the history and physical on blank blank she's a 32 year old gravida 5 para 3013 at 65 weeks and 3 days EDC 05/27/2013 negative GBS admitted in labor 4 cm she is now 9 cm S0 station membranes ruptured fluid was clear Past medical history negative Past surgical history negative Social history negative System review noncontributory Physical exam well-developed female in in a him HEENT negative Lungs clear to P&A Heart regular rhythm no murmurs or gallops The rest negative Abdomen term Pelvic as described above Extremities negative

## 2013-05-23 NOTE — Progress Notes (Signed)
Dr Gaynell Face notified of pt's arrival, VE, contraction pattern, FHR pattern, orders received

## 2013-05-23 NOTE — MAU Note (Signed)
Pt presents with complaints of contractions that started at 5 this morning. States some bloody show.

## 2013-05-23 NOTE — Anesthesia Procedure Notes (Signed)

## 2013-05-23 NOTE — Anesthesia Preprocedure Evaluation (Signed)

## 2013-05-24 LAB — CBC
Platelets: 156 10*3/uL (ref 150–400)
RDW: 13.9 % (ref 11.5–15.5)
WBC: 9.5 10*3/uL (ref 4.0–10.5)

## 2013-05-24 NOTE — Progress Notes (Signed)
Patient ID: Amber Travis, female   DOB: [birth year], 32 y.o.   MRN: [id number] Postpartum day one Vital signs normal Fundus firm Lochia moderate Legs negative Wants her to discharge home today

## 2013-05-24 NOTE — Progress Notes (Signed)
Patient was referred for history of depression/anxiety. * Referral screened out by Clinical Social Worker because none of the following criteria appear to apply:  ~ History of anxiety/depression during this pregnancy, or of post-partum depression.  ~ Diagnosis of anxiety and/or depression within last 3 years, per chart review.  ~ History of depression due to pregnancy loss/loss of child  OR * Patient's symptoms currently being treated with medication and/or therapy.  Please contact the Clinical Social Worker if needs arise, or by the patient's request. 

## 2013-05-24 NOTE — Discharge Summary (Signed)
Obstetric Discharge Summary Reason for Admission: onset of labor Prenatal Procedures: none Intrapartum Procedures: spontaneous vaginal delivery Postpartum Procedures: none Complications-Operative and Postpartum: none Hemoglobin  Date Value Range Status  05/24/2013 11.1* 12.0 - 15.0 g/dL Final     DELTA CHECK NOTED     REPEATED TO VERIFY     HCT  Date Value Range Status  05/24/2013 32.1* 36.0 - 46.0 % Final    Physical Exam:  General: alert Lochia: appropriate Uterine Fundus: firm Incision: healing well DVT Evaluation: No evidence of DVT seen on physical exam.  Discharge Diagnoses: Term Pregnancy-delivered  Discharge Information: Date: 05/24/2013 Activity: pelvic rest Diet: routine Medications: Percocet Condition: improved Instructions: refer to practice specific booklet Discharge to: home Follow-up Information   Schedule an appointment as soon as possible for a visit with Kathreen Cosier, MD.   Specialty:  Obstetrics and Gynecology   Contact information:   [address] [place] [address] [phone]       Newborn Data: Live born female  Birth Weight: 7 lb 7.2 oz (3380 g) APGAR: 9, 9  Home with mother.  Maymie Brunke A 05/24/2013, 6:56 AM

## 2013-05-24 NOTE — Anesthesia Postprocedure Evaluation (Signed)
  Anesthesia Post-op Note  Patient: Amber Travis  Procedure(s) Performed: * No procedures listed *  Patient Location: Mother/Baby  Anesthesia Type:Epidural  Level of Consciousness: awake  Airway and Oxygen Therapy: Patient Spontanous Breathing  Post-op Pain: none  Post-op Assessment: Patient's Cardiovascular Status Stable, Respiratory Function Stable, Patent Airway, No signs of Nausea or vomiting, Adequate PO intake, Pain level controlled, No headache, No backache, No residual numbness and No residual motor weakness  Post-op Vital Signs: Reviewed and stable  Complications: No apparent anesthesia complications

## 2013-05-24 NOTE — Progress Notes (Signed)
UR chart review completed.  

## 2014-02-20 ENCOUNTER — Encounter: Payer: Self-pay | Admitting: Internal Medicine

## 2014-02-20 ENCOUNTER — Ambulatory Visit: Payer: Medicaid Other | Attending: Internal Medicine | Admitting: Internal Medicine

## 2014-02-20 VITALS — BP 94/61 | HR 64 | Temp 98.0°F | Resp 16 | Ht 59.0 in | Wt 150.0 lb

## 2014-02-20 DIAGNOSIS — M545 Low back pain, unspecified: Secondary | ICD-10-CM | POA: Insufficient documentation

## 2014-02-20 DIAGNOSIS — N912 Amenorrhea, unspecified: Secondary | ICD-10-CM

## 2014-02-20 DIAGNOSIS — N911 Secondary amenorrhea: Secondary | ICD-10-CM | POA: Insufficient documentation

## 2014-02-20 LAB — CBC WITH DIFFERENTIAL/PLATELET
BASOS ABS: 0.1 10*3/uL (ref 0.0–0.1)
BASOS PCT: 1 % (ref 0–1)
EOS PCT: 3 % (ref 0–5)
Eosinophils Absolute: 0.2 10*3/uL (ref 0.0–0.7)
HEMATOCRIT: 39.8 % (ref 36.0–46.0)
HEMOGLOBIN: 13.9 g/dL (ref 12.0–15.0)
Lymphocytes Relative: 34 % (ref 12–46)
Lymphs Abs: 1.9 10*3/uL (ref 0.7–4.0)
MCH: 31.4 pg (ref 26.0–34.0)
MCHC: 34.9 g/dL (ref 30.0–36.0)
MCV: 89.8 fL (ref 78.0–100.0)
MONO ABS: 0.4 10*3/uL (ref 0.1–1.0)
MONOS PCT: 7 % (ref 3–12)
Neutro Abs: 3.1 10*3/uL (ref 1.7–7.7)
Neutrophils Relative %: 55 % (ref 43–77)
Platelets: 210 10*3/uL (ref 150–400)
RBC: 4.43 MIL/uL (ref 3.87–5.11)
RDW: 13.4 % (ref 11.5–15.5)
WBC: 5.7 10*3/uL (ref 4.0–10.5)

## 2014-02-20 LAB — POCT GLYCOSYLATED HEMOGLOBIN (HGB A1C): Hemoglobin A1C: 5

## 2014-02-20 LAB — POCT URINE PREGNANCY: Preg Test, Ur: NEGATIVE

## 2014-02-20 NOTE — Progress Notes (Signed)
Patient ID: Amber SawyersMarisol E Travis, female   DOB: 08-30-1981, 33 y.o.   MRN: [id number]   Amber Travis, is a 33 y.o. female  Amber Travis  [id number]  DOB - 08-30-1981  CC:  Chief Complaint  Patient presents with  . Establish Care       HPI: Amber Travis is a 33 y.o. female here today to establish medical care. Patient has no significant past medical history except for occasional bad headache, head today with major complaint of amenorrhea. Her last menstrual period was months ago, she is not on any birth control, denies any stress, no abdominal or pelvic pain. Last childbirth was September 2014) through spontaneous vaginal delivery, patient reported no complications, no excessive bleeding, she had episiotomy which was duly sutured. Patient breast-fed for few months, has not had menstruation since delivering. Patient has No headache, No chest pain, No abdominal pain - No Nausea, No new weakness tingling or numbness, No Cough - SOB.  No Known Allergies Past Medical History  Diagnosis Date  . No pertinent past medical history    Current Outpatient Prescriptions on File Prior to Visit  Medication Sig Dispense Refill  . Prenatal Vit-Fe Fumarate-FA (PRENATAL MULTIVITAMIN) TABS Take 1 tablet by mouth daily.       No current facility-administered medications on file prior to visit.   Family History  Problem Relation Age of Onset  . Diabetes Paternal Aunt    History   Social History  . Marital Status: Single    Spouse Name: N/A    Number of Children: N/A  . Years of Education: N/A   Occupational History  . Not on file.   Social History Main Topics  . Smoking status: Never Smoker   . Smokeless tobacco: Never Used  . Alcohol Use: No  . Drug Use: No  . Sexual Activity: Yes    Birth Control/ Protection: None   Other Topics Concern  . Not on file   Social History Narrative  . No narrative on file    Review of Systems: Constitutional: Negative for fever, chills,  diaphoresis, activity change, appetite change and fatigue. HENT: Negative for ear pain, nosebleeds, congestion, facial swelling, rhinorrhea, neck pain, neck stiffness and ear discharge.  Eyes: Negative for pain, discharge, redness, itching and visual disturbance. Respiratory: Negative for cough, choking, chest tightness, shortness of breath, wheezing and stridor.  Cardiovascular: Negative for chest pain, palpitations and leg swelling. Gastrointestinal: Negative for abdominal distention. Genitourinary: Negative for dysuria, urgency, frequency, hematuria, flank pain, decreased urine volume, difficulty urinating and dyspareunia.  Musculoskeletal: Negative for back pain, joint swelling, arthralgia and gait problem. Neurological: Negative for dizziness, tremors, seizures, syncope, facial asymmetry, speech difficulty, weakness, light-headedness, numbness and headaches.  Hematological: Negative for adenopathy. Does not bruise/bleed easily. Psychiatric/Behavioral: Negative for hallucinations, behavioral problems, confusion, dysphoric mood, decreased concentration and agitation.    Objective:   Filed Vitals:   02/20/14 0905  BP: 94/61  Pulse: 64  Temp: 98 F (36.7 C)  Resp: 16    Physical Exam: Constitutional: Patient appears well-developed and well-nourished. No distress. HENT: Normocephalic, atraumatic, External right and left ear normal. Oropharynx is clear and moist.  Eyes: Conjunctivae and EOM are normal. PERRLA, no scleral icterus. Neck: Normal ROM. Neck supple. No JVD. No tracheal deviation. No thyromegaly. CVS: RRR, S1/S2 +, no murmurs, no gallops, no carotid bruit.  Pulmonary: Effort and breath sounds normal, no stridor, rhonchi, wheezes, rales.  Abdominal: Soft. BS +, no distension, tenderness, rebound or guarding.  Musculoskeletal: Normal range  of motion. No edema and no tenderness.  Lymphadenopathy: No lymphadenopathy noted, cervical, inguinal or axillary Neuro: Alert. Normal  reflexes, muscle tone coordination. No cranial nerve deficit. Skin: Skin is warm and dry. No rash noted. Not diaphoretic. No erythema. No pallor. Psychiatric: Normal mood and affect. Behavior, judgment, thought content normal.  Lab Results  Component Value Date   WBC 9.5 05/24/2013   HGB 11.1* 05/24/2013   HCT 32.1* 05/24/2013   MCV 89.9 05/24/2013   PLT 156 05/24/2013   Lab Results  Component Value Date   CREATININE 0.59 10/10/2012   BUN 11 10/10/2012   NA 137 10/10/2012   K 3.6 10/10/2012   CL 101 10/10/2012   CO2 26 10/10/2012    No results found for this basename: HGBA1C   Lipid Panel     Component Value Date/Time   CHOL 178 06/18/2010 0000   TRIG 154* 06/18/2010 0000   HDL 52 06/18/2010 0000   CHOLHDL 3.4 Ratio 06/18/2010 0000   VLDL 31 06/18/2010 0000   LDLCALC 95 06/18/2010 0000       Assessment and plan:   1. Secondary amenorrhea  - POCT urine pregnancy is negative today.  - CBC with Differential - COMPLETE METABOLIC PANEL WITH GFR - POCT glycosylated hemoglobin (Hb A1C) - Lipid panel - TSH - Urinalysis, Complete - US Pelvis Complete; Future - US Transvaginal Non-OB; Future Patient was educated, this could be as a result of childbirth and breast-feeding, options include watch and wait.  Patient was counseled extensively about nutrition and exercise  Return in about 6 months (around 08/22/2014), or if symptoms worsen or fail to improve, for Pap Smear, Follow up Pain and comorbidities.  The patient was given clear instructions to go to ER or return to medical center if symptoms don't improve, worsen or new problems develop. The patient verbalized understanding. The patient was told to call to get lab results if they haven't heard anything in the next week.     This note has been created with Education officer, environmentalDragon speech recognition software and smart phrase technology. Any transcriptional errors are unintentional.    Jeanann LewandowskyJEGEDE, OLUGBEMIGA, MD, MHA, FACP, FAAP [facility] [place], [place] [phone]   02/20/2014, 9:54 AM

## 2014-02-20 NOTE — Patient Instructions (Signed)
Amenorrea secundaria   (Secondary Amenorrhea )  La amenorrea secundaria es la detención del flujo menstrual durante 3 6 meses en una mujer que previamente tenía sus períodos. Hay muchas causas posibles: La mayoría de las causas no son graves. Generalmente, al tratar el problema subyacente que causa la detención de la menstruación, podrá volver a tener sus períodos normales.  CAUSAS   Algunas causas comunes de la falta de menstruación son:  · Desnutrición.  · Bajo nivel de azúcar en la sangre (hipoglucemia).  · Enfermedad poliquística de los ovarios.  · Estrés o miedos.  · Lactancia materna.  · Desequilibrio hormonal.  · Insuficiencia ovárica.  · Medicamentos.  · Obesidad extrema.  · Fibrosis quística.  · Reducción de peso drástica por cualquier causa.  · Menopausia precoz.  · Extirpación de los ovarios o del útero.  · Anticonceptivos.  · Enfermedades.  · Enfermedades de larga duración (crónicas).  · Síndrome de [place].  · Problemas de tiroides.  · Píldoras, parches o anillos vaginales para el control de la natalidad.  FACTORES DE RIESGO  Puede tener más riesgo de amenorrea secundaria si:  · Tiene una historia familiar de este problema.  · Sufre un trastorno alimentario.  · Realiza entrenamiento deportivo.  DIAGNÓSTICO   Este diagnóstico la realiza el médico por medio de la historia clínica y el examen físico. Incluirá un examen pélvico para verificar si hay problemas en los órganos reproductores. Debe descartarse la posibilidad de embarazo. Generalmente se indicarán diferentes análisis de sangre para medir diferentes tipos de hormonas en el organismo. Le indicarán análisis de orina. Le harán algunos estudios especializados (ecografías, tomografía computada, resonancia magnética o histeroscopía) y también medirán su índice de masa corporal (IMC).  TRATAMIENTO   El tratamiento depende de la causa de la amenorrea. Si hay un trastorno de la alimentación, deberá tratarse con la dieta y la terapia adecuadas. Los  trastornos crónicos pueden mejorar con el tratamiento de la enfermedad. La amenorrea puede corregirse con medicamentos, cambios en el estilo de vida o con cirugía. Si la amenorrea no puede corregirse, algunas veces es posible crear una falsa menstruación con medicamentos.  INSTRUCCIONES PARA EL CUIDADO EN EL HOGAR  · Consuma una dieta saludable.  · Controle los problemas de peso.  · Haga ejercicios con regularidad, pero no excesivamente.  · Duerma lo suficiente.  · Controle el estrés.  · Observe si hay cambios en el ciclo menstrual. Mantenga un registro del momento en que ocurren los períodos. Anote la fecha de inicio de los períodos, cuánto duran y si hay problemas.  SOLICITE ATENCIÓN MÉDICA SI:  Los síntomas no mejoran con el tratamiento.  Document Released: 04/25/2013  Exit[employer]® Patient Information ©2014 [employer].

## 2014-02-20 NOTE — Progress Notes (Signed)
Pt is here to establish care. Pt reports having Amenorrhea that has her concerned. Pt woke up this morning with acute lower back pain.

## 2014-02-21 LAB — URINALYSIS, COMPLETE
BACTERIA UA: NONE SEEN
BILIRUBIN URINE: NEGATIVE
Casts: NONE SEEN
Crystals: NONE SEEN
Glucose, UA: NEGATIVE mg/dL
Hgb urine dipstick: NEGATIVE
KETONES UR: NEGATIVE mg/dL
Nitrite: NEGATIVE
PROTEIN: NEGATIVE mg/dL
Specific Gravity, Urine: 1.019 (ref 1.005–1.030)
UROBILINOGEN UA: 0.2 mg/dL (ref 0.0–1.0)
pH: 5 (ref 5.0–8.0)

## 2014-02-21 LAB — COMPLETE METABOLIC PANEL WITH GFR
ALBUMIN: 4.4 g/dL (ref 3.5–5.2)
ALK PHOS: 65 U/L (ref 39–117)
ALT: 17 U/L (ref 0–35)
AST: 17 U/L (ref 0–37)
BUN: 15 mg/dL (ref 6–23)
CALCIUM: 9.5 mg/dL (ref 8.4–10.5)
CO2: 24 mEq/L (ref 19–32)
CREATININE: 0.62 mg/dL (ref 0.50–1.10)
Chloride: 107 mEq/L (ref 96–112)
GFR, Est African American: >89 mL/min
GLUCOSE: 84 mg/dL (ref 70–99)
Potassium: 4.2 mEq/L (ref 3.5–5.3)
Sodium: 141 mEq/L (ref 135–145)
Total Bilirubin: 1.5 mg/dL — ABNORMAL HIGH (ref 0.2–1.2)
Total Protein: 7.2 g/dL (ref 6.0–8.3)

## 2014-02-21 LAB — TSH: TSH: 0.666 u[IU]/mL (ref 0.350–4.500)

## 2014-02-21 LAB — LIPID PANEL
Cholesterol: 170 mg/dL (ref 0–200)
HDL: 45 mg/dL (ref >39)
LDL CALC: 113 mg/dL — AB (ref 0–99)
TRIGLYCERIDES: 58 mg/dL (ref <150)
Total CHOL/HDL Ratio: 3.8 Ratio
VLDL: 12 mg/dL (ref 0–40)

## 2014-02-25 ENCOUNTER — Telehealth: Payer: Self-pay | Admitting: Emergency Medicine

## 2014-02-25 ENCOUNTER — Ambulatory Visit (HOSPITAL_COMMUNITY): Payer: Medicaid Other

## 2014-02-25 MED ORDER — CIPROFLOXACIN HCL 500 MG PO TABS: 500.0000 mg | ORAL_TABLET | Freq: Two times a day (BID) | ORAL | Status: DC

## 2014-02-25 NOTE — Telephone Encounter (Signed)
Attempted to reach pt on both numbers listed; no voicemail set up and message left per pacific intepretor [facility]

## 2014-02-25 NOTE — Telephone Encounter (Signed)
Message copied by Darlis LoanSMITH, JILL D on Mon Feb 25, 2014  5:29 PM ------      Message from: Quentin AngstJEGEDE, OLUGBEMIGA E      Created: Sun Feb 24, 2014  4:41 PM       Please inform patient that her laboratory test results are mostly within normal limit except for evidence of mild urinary tract infection. This can be easily treated with antibiotics:            Please call in prescription ciprofloxacin 500 mg tablet by mouth twice a day for 5 days. ------

## 2014-06-13 ENCOUNTER — Ambulatory Visit: Payer: Medicaid Other | Admitting: Internal Medicine

## 2014-07-08 ENCOUNTER — Encounter: Payer: Self-pay | Admitting: Internal Medicine

## 2014-12-04 ENCOUNTER — Ambulatory Visit: Payer: Self-pay | Attending: Internal Medicine | Admitting: Internal Medicine

## 2014-12-04 ENCOUNTER — Encounter: Payer: Self-pay | Admitting: Internal Medicine

## 2014-12-04 VITALS — BP 107/72 | HR 72 | Temp 98.3°F | Resp 16 | Ht 64.0 in | Wt 152.0 lb

## 2014-12-04 DIAGNOSIS — Z124 Encounter for screening for malignant neoplasm of cervix: Secondary | ICD-10-CM

## 2014-12-04 DIAGNOSIS — Z30011 Encounter for initial prescription of contraceptive pills: Secondary | ICD-10-CM

## 2014-12-04 DIAGNOSIS — Z309 Encounter for contraceptive management, unspecified: Secondary | ICD-10-CM

## 2014-12-04 DIAGNOSIS — Z01419 Encounter for gynecological examination (general) (routine) without abnormal findings: Secondary | ICD-10-CM | POA: Insufficient documentation

## 2014-12-04 MED ORDER — NORETHINDRONE ACET-ETHINYL EST 1-20 MG-MCG PO TABS: 1.0000 | ORAL_TABLET | Freq: Every day | ORAL | Status: DC

## 2014-12-04 NOTE — Progress Notes (Signed)
Patient ID: Amber Travis, female   DOB: 1981/02/01, 34 y.o.   MRN: [id number]  CC: pap/ breast, birth control   HPI: Amber Travis is a 34 y.o. female here today for a follow up visit.  She is requesting a pap and breast exam today. She states that she has had vaginal burning for the past two years. She states that she has been checked for several things in the past but her past doctors have been unable to find the cause of her burning. Patient would like STD testing today.   Patient is also interested in beginning on birth control. She reports that her last menstrual period ended 3 days ago. She is currently still breastfeeding her one year old child. She reports that she was on OCP in the past and would like to go back on the same thing.     Patient has No headache, No chest pain, No abdominal pain - No Nausea, No new weakness tingling or numbness, No Cough - SOB.  No Known Allergies Past Medical History  Diagnosis Date  . No pertinent past medical history    Current Outpatient Prescriptions on File Prior to Visit  Medication Sig Dispense Refill  . ciprofloxacin (CIPRO) 500 MG tablet Take 1 tablet (500 mg total) by mouth 2 (two) times daily. 10 tablet 0  . Prenatal Vit-Fe Fumarate-FA (PRENATAL MULTIVITAMIN) TABS Take 1 tablet by mouth daily.     No current facility-administered medications on file prior to visit.   Family History  Problem Relation Age of Onset  . Diabetes Paternal Aunt    History   Social History  . Marital Status: Single    Spouse Name: N/A  . Number of Children: N/A  . Years of Education: N/A   Occupational History  . Not on file.   Social History Main Topics  . Smoking status: Never Smoker   . Smokeless tobacco: Never Used  . Alcohol Use: No  . Drug Use: No  . Sexual Activity: Yes    Birth Control/ Protection: None   Other Topics Concern  . Not on file   Social History Narrative    Review of Systems: See HPI     Objective:   Filed  Vitals:   12/04/14 1130  BP: 107/72  Pulse: 72  Temp: 98.3 F (36.8 C)  Resp: 16    Physical Exam  Constitutional: She is oriented to person, place, and time.  Cardiovascular: Normal rate, regular rhythm and normal heart sounds.   Pulmonary/Chest: Effort normal and breath sounds normal. Right breast exhibits mass. Left breast exhibits no mass.  Genitourinary: Vagina normal and uterus normal. No breast tenderness or discharge. Cervix exhibits no motion tenderness, no discharge and no friability. Right adnexum displays no tenderness. Left adnexum displays no tenderness.  Lymphadenopathy:       Right: No inguinal adenopathy present.       Left: No inguinal adenopathy present.  Neurological: She is alert and oriented to person, place, and time.     Lab Results  Component Value Date   WBC 5.7 02/20/2014   HGB 13.9 02/20/2014   HCT 39.8 02/20/2014   MCV 89.8 02/20/2014   PLT 210 02/20/2014   Lab Results  Component Value Date   CREATININE 0.62 02/20/2014   BUN 15 02/20/2014   NA 141 02/20/2014   K 4.2 02/20/2014   CL 107 02/20/2014   CO2 24 02/20/2014    Lab Results  Component Value Date   HGBA1C  5.0 02/20/2014   Lipid Panel     Component Value Date/Time   CHOL 170 02/20/2014 1000   TRIG 58 02/20/2014 1000   HDL 45 02/20/2014 1000   CHOLHDL 3.8 02/20/2014 1000   VLDL 12 02/20/2014 1000   LDLCALC 113* 02/20/2014 1000       Assessment and plan:   Diagnoses and all orders for this visit:  Papanicolaou smear Orders: -     Cytology - PAP [place] -     Cervicovaginal ancillary only -     HIV antibody (with reflex) -     RPR  BCP (birth control pills) initiation Orders: -     norethindrone-ethinyl estradiol (MICROGESTIN,JUNEL,LOESTRIN) 1-20 MG-MCG tablet; Take 1 tablet by mouth daily. -     POCT urine pregnancy    Due to language barrier, an interpreter was present during the history-taking and subsequent discussion (and for part of the physical exam)  with this patient.  Return if symptoms worsen or fail to improve, for with Advani.        Holland Commons, NP-C [facility] [phone] 12/04/2014, 12:14 PM

## 2014-12-04 NOTE — Progress Notes (Signed)
Pt is here for a pap smear and a physical. Pt is requesting birth control. She is still currently breast feeding and want a BC that wont affect her breast milk. Interpreter [phone]

## 2014-12-04 NOTE — Patient Instructions (Signed)
Clarinda (Health Maintenance) Adoptar un estilo de vida saludable y recibir atencin preventiva pueden ser de suma utilidad para promover la salud y Musician. Hable con el mdico para saber cul es el esquema de exmenes peridicos adecuado para usted. Esta es una buena oportunidad para Teacher, adult education peridicamente al mdico sobre cmo prevenir enfermedades y [place] sano. Entre cada control mdico, hay muchas cosas que puede hacer por s solo. Los expertos han investigado mucho acerca de los cambios en el estilo de vida y las medidas preventivas que muy probablemente preserven su salud. Consulte al mdico para obtener ms informacin. EL PESO Y LA DIETA  Consuma una dieta saludable.  Incluya abundante cantidad de verduras, frutas, productos lcteos descremados y protenas magras.  No coma muchos alimentos con alto contenido de grasas slidas, azcares agregados o sal.  Realice actividad fsica con regularidad. Esta es una de las cosas ms importantes que puede hacer por su salud.  La [employer] de las personas adultas deben hacer actividad fsica durante por lo menos 140mnutos semanales. El ejercicio debe aumentar la frecuencia cardaca y hNature conservation officersudar (ejercicio de intensidad moderada).  Adems, casi todos los adultos deben hacer ejercicios de fortalecimiento al mToysRusveces por semana como complemento del ejercicio de iDante Mantenga un peso saludable.  El ndice de masa corporal ([facility] es una medida que puede usarse para identificar posibles problemas relacionados con el peso. Este ofrece un clculo estimativo de la gAir traffic controlleren funcin del peso y lAgricultural consultant El mdico puede determinar su IEncino Outpatient Surgery Center LLCy ayudarlo a aScience writery mTheatre managerun peso saludable.  Para las mujeres mayores de 20aos:  Un ISurgicare Surgical Associates Of Mahwah LLCmenor de 18,5 se considera bajo peso.  Un IBaptist Health Medical Center - North Little Rockentre 18,5 y 24,9 es normal.  Un ITerrell State Hospitalentre 25 y 29,9 es sobrepeso.  Un IMC de 30 o ms se considera  obesidad. Controlar los niveles de colesterol y lpidos en la sangre  Debe comenzar a hacerse anlisis de sangre para controlar los nAscutneyde lpidos y colesterol a partir de lSedalia y repetir estos estudios cada 5aos.  Tal vez deba someterse a controles de los niveles de colesterol con ms frecuencia si:  Tiene los niveles de lpidos o colesterol elevados.  Es mayor de 50aos.  Tiene un riesgo alto de tener enfermedades cardacas. DETECCIN DE CNCER  Cncer de pulmn  Se recomienda realizar exmenes de deteccin de cncer de pulmn a las personas adultas que tienen entre 581y 80aos, y corren riesgo de tBest boycncer de pulmn debido a sus antecedentes de tabaquismo.  Se recomienda realizar una tomografa computarizada anual de baja dosis de los pulmones a las personas que:  Siguen fumando.  Hayan dejado de fumar en los ltimos 15aos.  Hayan fumado un paquete diario durante 30aos. El ndice ao-paquete equivale a fumar, en promedio, un paquete de cigarrillos diario durante 1ao.  Debe seguir realizndose estudios de deteccin anual hasta que hayan pasado 15aos desde que dej de fumar.  Estos estudios deben suspenderse si tiene un problema de salud que le impedira recibir tratamiento para eScience writerde pulmn. Cncer de mama  Ponga en prctica la "autoconciencia de las mamas". Esto significa reconocer la apariencia normal de las mamas y cSt. [place]airs[place]  Adems implica realizarse autoexmenes peridicos de lJohnson & Johnson Informe al mdico si hay algn cambio, sin importar cun pequeo sea.  Si tiene entre 20 y 30aos, un mdico debe hacerle un examen clnico de las mamas cada 1 a 316aoscomo parte del  examen habitual de salud.  Si es mayor de 40aos, debe Electrical engineer un examen clnico de las Microsoft. Tambin debe considerar la posibilidad de realizarse una radiografa de las mamas ([place]) todos los [place].  Si tiene antecedentes familiares de cncer de  mama, hable con el mdico para saber si debe someterse a un estudio gentico.  Si tiene un riesgo alto de Animal nutritionist de mama, hable con el mdico para saber si debe hacerse una resonancia magntica y [employer].  Se recomienda una evaluacin del gen del cncer de mama (BRCA) a las mujeres que tengan familiares con tumores malignos relacionados con el BRCA. Los tumores malignos relacionados con elBRCA incluyen:  [employer].  Los [employer].  Los tumores malignos del peritoneo.  Los resultados de la evaluacin determinarn la necesidad de asesoramiento gentico y de [place] de BRCA1 y BRCA2. Cncer de cuello uterino Ya no se recomiendan los exmenes plvicos de rutina para la deteccin del cncer de cuello uterino en las mujeres que no estn embarazadas que son consideradas sujetos de bajo riesgo de Best boy cncer de los rganos de la pelvis (ovarios, tero y vagina) y que no tienen sntomas. Tal vez sea necesario realizar un examen plvico si tiene sntomas, incluidos aquellos que estn asociados con infecciones en la pelvis. Pregntele al mdico si un examen plvico de deteccin es adecuado para usted.   El Papanicolau es la prueba de deteccin del cncer de cuello uterino para las mujeres que podran Engineer, production.  Si le han realizado una histerectoma por un problema que no era cncer u otra enfermedad que podra causar cncer, ya no necesitar realizarse pruebas de Papanicolaou.  Si es mayor de 39aos y los resultados de las pruebas de Papanicolaou han sido normales durante los ltimos 10aos, ya no es necesario que se realice estos estudios.  Si ha recibido un tratamiento para el cncer de cuello uterino o para una enfermedad que podra causar cncer, necesitar realizarse una prueba de Papanicolaou y controles durante al menos 26 aos de concluido el [place].  Si ya no se realiza pruebas de Papanicolaou, debe evaluar sus factores de riesgo si estos  se modifican (por ejemplo, tiene un nuevo compaero sexual). Esta situacin puede influir en la necesidad de que se someta nuevamente a estudios de deteccin.  Algunas mujeres sufren problemas mdicos que aumentan la probabilidad de tener cncer de cuello uterino. En este caso, el mdico podr indicarle que se someta a exmenes de deteccin y pruebas de Papanicolaou con ms frecuencia.  La prueba del virus del Engineer, technical sales (VPH) es un estudio adicional que puede usarse para la deteccin del cncer de cuello uterino. Esta prueba busca la presencia del virus que puede causar cambios celulares en el cuello del tero. Las clulas que se recolectan durante la prueba de Papanicolaou pueden usarse para el VPH.  La prueba del VPH puede usarse para examinar a las Cendant Corporation de 46TKP. Someterse a International aid/development worker del VPH puede prolongar el [employer] las pruebas de Papanicolaou normales de tres a Product manager.  Adems, se debe realizar la prueba del VPH para evaluar a las mujeres de cualquier edad cuyos resultados del Papanicolau no sean claros.  Despus de los 30aos, las mujeres deben realizarse pruebas del VPH con la misma frecuencia que las pruebas de Papanicolau. Cncer colorrectal  Es posible detectar este tipo de cncer y, a menudo, es posible prevenirlo.  Generalmente, los estudios de deteccin de  rutina del cncer colorrectal empiezan a hacerse a partir de los 50aos y continan hasta los 75aos.  El mdico puede recomendar que se los haga antes, si tiene factores de riesgo de cncer de colon.  Adems, el mdico puede recomendar que use un kit de prueba casera para hallar sangre oculta en la materia fecal.  Es posible que se use una pequea cmara en el extremo de un tubo para examinar directamente el colon (sigmoidoscopa o colonoscopa), con el fin de detectar las formas ms incipientes de cncer colorrectal.  Generalmente, los estudios de deteccin de rutina se realizan  a partir de los 50aos.  El examen directo del colon debe repetirse cada 5 a 10aos hasta cumplir 75aos. Sin embargo, tal vez deba someterse a la prueba de deteccin con ms frecuencia si se encuentran formas incipientes de plipos precancerosos o pequeos tumores. Cncer de piel  Revsese la piel peridicamente desde los dedos de los pies hasta la cabeza.  Informe al mdico si aparecen nuevos lunares o si nota cambios en los que ya tiene, especialmente en la forma o el color.  Tambin notifquele si tiene un lunar cuyo tamao es ms grande que el de la goma de un lpiz.  Use siempre pantalla solar. Aplique pantalla solar tantas veces como pueda a lo largo del da.  Protjase usando mangas y pantalones largos, un sombrero de ala ancha y gafas para el sol todo el ao, siempre que est al aire libre. ENFERMEDADES CARDACAS, DIABETES E HIPERTENSIN ARTERIAL   Debe controlarse la presin arterial al menos cada 1 o 2aos. La hipertensin arterial causa enfermedades cardacas y aumenta el riesgo de ictus.  Si tiene entre 55 y 79aos, consulte al mdico si debe tomar aspirina para prevenir ictus.  Hgase anlisis peridicos para la diabetes, que incluyen la toma de una muestra de sangre para controlar el nivel de azcar en la sangre mientras est en ayunas.  Si su peso es normal y tiene un riesgo bajo de tener diabetes, hgase este anlisis una vez cada tres aos, despus de los 45aos.  Si tiene sobrepeso y un riesgo alto de sufrir diabetes, considere la posibilidad de hacerse los anlisis a una edad ms temprana o con ms frecuencia. PREVENCIN DE INFECCIONES  Hepatitis B  Si tiene un riesgo ms alto de tener hepatitisB, debe hacerse anlisis de deteccin de este virus. Se considera que tiene un alto riesgo de hepatitis B si:  Naci en un pas donde la hepatitisB es frecuente. Pregntele al mdico qu pases son considerados de alto riesgo.  Sus padres nacieron en un pas de alto  riesgo, y usted no recibi la vacuna contra la hepatitis B.  Tiene VIH o sida.  Usa agujas para inyectarse drogas.  Convive con una persona que tiene hepatitisB.  Tuvo relaciones sexuales con una persona que tiene hepatitisB.  Recibe tratamiento de hemodilisis.  Toma ciertos medicamentos para cuadros clnicos tales como cncer, trasplante de  

## 2014-12-05 LAB — CERVICOVAGINAL ANCILLARY ONLY
Chlamydia: NEGATIVE
NEISSERIA GONORRHEA: NEGATIVE
WET PREP (BD AFFIRM): NEGATIVE

## 2014-12-05 LAB — CYTOLOGY - PAP

## 2014-12-05 LAB — HIV ANTIBODY (ROUTINE TESTING W REFLEX): HIV 1&2 Ab, 4th Generation: NONREACTIVE

## 2014-12-05 LAB — RPR

## 2014-12-05 LAB — POCT URINE PREGNANCY: PREG TEST UR: NEGATIVE

## 2014-12-10 ENCOUNTER — Telehealth: Payer: Self-pay | Admitting: *Deleted

## 2014-12-10 NOTE — Telephone Encounter (Signed)
Pt is aware of her pap results.  

## 2014-12-20 ENCOUNTER — Telehealth: Payer: Self-pay | Admitting: *Deleted

## 2014-12-20 NOTE — Telephone Encounter (Signed)
-----   Message from Ambrose FinlandValerie A Keck, NP sent at 12/13/2014 10:23 PM EDT ----- Patient pap is negative for malignancies. Will repeat in 3 years.

## 2014-12-20 NOTE — Telephone Encounter (Signed)
Pt is aware of her pap results.  

## 2014-12-20 NOTE — Telephone Encounter (Signed)
Pt called back returning nurse's phone call, please f/u with pt at home phone number .

## 2015-02-04 ENCOUNTER — Encounter: Payer: Self-pay | Admitting: Internal Medicine

## 2015-02-04 ENCOUNTER — Ambulatory Visit: Payer: Self-pay | Attending: Internal Medicine | Admitting: Internal Medicine

## 2015-02-04 VITALS — BP 99/65 | HR 63 | Temp 98.2°F | Resp 16 | Ht 59.0 in | Wt 153.0 lb

## 2015-02-04 DIAGNOSIS — H538 Other visual disturbances: Secondary | ICD-10-CM | POA: Insufficient documentation

## 2015-02-04 DIAGNOSIS — N63 Unspecified lump in unspecified breast: Secondary | ICD-10-CM | POA: Insufficient documentation

## 2015-02-04 NOTE — Patient Instructions (Signed)
Visin borrosa (Blurred Vision) Vito BackersHoy ha concurrido al mdico por tener visin borrosa. Esto significa que ha perdido la capacidad de ver pequeos detalles.  CAUSAS La visin borrosa puede ser el sntoma de problemas oculares subyacentes, como:  Envejecimiento del ojo (presbiopa).  Glaucoma.  Cataratas.  Infeccin ocular.  Migraas relacionadas con el ojo.  Diabetes mellitus.  Fatiga.  Cefalea migraosa.  Presin arterial elevada.  Ruptura de la parte posterior del ojo (degeneracin macular).  Problemas causados por algunos medicamentos. La causa ms comn de la visin borrosa es la necesidad de anteojos o una nueva prescripcin. Hoy en el departamento de emergencias, no se ha encontrado la causa de su visin borrosa. SNTOMAS La visin borrosa es la prdida de la [place]ig[place] Barbudaagudeza visual y de los Herrondetalles. DIAGNSTICO Si continua teniendo visin borrosa, deber consultar al mdico. Si el profesional es su mdico de cabecera, podra derivarlo a un especialista.  TRATAMIENTO No ignore su visin borrosa. Asegrese de controlarse para ver si necesita otro tratamiento o derivacin. SOLICITE ATENCIN MDICA SI: No puede concurrir a Music therapistun especialista y se le puede ayudar con una derivacin. SOLICITE ATENCIN MDICA DE INMEDIATO SI: Siente dolor intenso en el ojo, en la cabeza, o prdida repentina de la visin. EST SEGURO QUE:   Comprende las instrucciones para el alta mdica.  Controlar su enfermedad.  Solicitar atencin mdica de inmediato segn las indicaciones. Document Released: 11/19/2008 Document Revised: 11/15/2011 [facility] Patient Information 2015 [place]ExitCare, MarylandLLC. This information is not intended to replace advice given to you by your health care provider. Make sure you discuss any questions you have with your health care provider.

## 2015-02-04 NOTE — Progress Notes (Signed)
Patient ID: Amber Travis, female   DOB: 10-24-1980, 33 y.o.   MRN: [id number]   Amber Travis, is a 34 y.o. female  WUJ:[id number]  [id number]  DOB - 1981/01/30  Chief Complaint  Patient presents with  . Headache  . Blurred Vision        Subjective:   Amber Travis is a 34 y.o. female here today for a follow up visit.  Patient has no significant past medical history , came to the clinic with major complaint of blurry vision and headache. Patient does not use glasses , but lately has been having difficulty reading especially distant letters.  She also has occasional headaches , for at 10 in severity , mostly  Frontal,  No associated photophobia or phonophobia.  Patient takes Tylenol occasionally for headache with relief. No nausea or vomiting. Patient has No chest pain, No abdominal pain, No new weakness tingling or numbness, No Cough - SOB. Patient also complaining of lump in her breast , she has been told in the past that its benign but lately it's been painful , she would like it checked out. She actually went to an event where there was a free mammogram but was told she would need a referral. There is no nipple discharge.  Problem  Blurred Vision, Bilateral  Breast Lump in Female    ALLERGIES: No Known Allergies  PAST MEDICAL HISTORY: Past Medical History  Diagnosis Date  . No pertinent past medical history     MEDICATIONS AT HOME: Prior to Admission medications   Medication Sig Start Date End Date Taking? Authorizing Provider  norethindrone-ethinyl estradiol (MICROGESTIN,JUNEL,LOESTRIN) 1-20 MG-MCG tablet Take 1 tablet by mouth daily. 12/04/14  Yes Ambrose Finland, NP  ciprofloxacin (CIPRO) 500 MG tablet Take 1 tablet (500 mg total) by mouth 2 (two) times daily. Patient not taking: Reported on 02/04/2015 02/25/14   Quentin Angst, MD  Prenatal Vit-Fe Fumarate-FA (PRENATAL MULTIVITAMIN) TABS Take 1 tablet by mouth daily.    Historical Provider, MD     Objective:    Filed Vitals:   02/04/15 1229  BP: 99/65  Pulse: 63  Temp: 98.2 F (36.8 C)  TempSrc: Oral  Resp: 16  Height:  (1.499 m)  Weight: 153 lb (69.4 kg)  SpO2: 97%    Exam General appearance : Awake, alert, not in any distress. Speech Clear. Not toxic looking HEENT: Atraumatic and Normocephalic, pupils equally reactive to light and accomodation Neck: supple, no JVD. No cervical lymphadenopathy.  Chest: lump on the left breast upper outer quadrant , about 3 x 3 cm , mobile , mildly tender. Good air entry bilaterally, no added sounds  CVS: S1 S2 regular, no murmurs.  Abdomen: Bowel sounds present, Non tender and not distended with no gaurding, rigidity or rebound. Extremities: B/L Lower Ext shows no edema, both legs are warm to touch Neurology: Awake alert, and oriented X 3, CN II-XII intact, Non focal Skin:No Rash  Data Review Lab Results  Component Value Date   HGBA1C 5.0 02/20/2014     Assessment & Plan   1. Blurred vision, bilateral  - Ambulatory referral to Optometry  2. Breast lump in female  - US BREAST COMPLETE UNI LEFT INC AXILLA; Future - US BREAST COMPLETE UNI RIGHT INC AXILLA; Future  Patient have been counseled extensively about nutrition and exercise Return in about 6 months (around 08/06/2015).  The patient was given clear instructions to go to ER or return to medical center if symptoms don't improve,  worsen or new problems develop. The patient verbalized understanding. The patient was told to call to get lab results if they haven't heard anything in the next week.   This note has been created with Education officer, environmentalDragon speech recognition software and smart phrase technology. Any transcriptional errors are unintentional.    Jeanann LewandowskyJEGEDE, Lewi Drost, MD, MHA, CPE, FACP, FAAP [facility] [place], [place] [phone]   02/04/2015, 1:02 PM

## 2015-02-04 NOTE — Progress Notes (Signed)
Complaining of HA x 2 weeks And blurry vision

## 2015-02-10 ENCOUNTER — Telehealth: Payer: Self-pay | Admitting: *Deleted

## 2015-02-10 ENCOUNTER — Other Ambulatory Visit: Payer: Self-pay | Admitting: Internal Medicine

## 2015-02-10 DIAGNOSIS — R229 Localized swelling, mass and lump, unspecified: Principal | ICD-10-CM

## 2015-02-10 DIAGNOSIS — N63 Unspecified lump in unspecified breast: Secondary | ICD-10-CM

## 2015-02-10 DIAGNOSIS — IMO0002 Reserved for concepts with insufficient information to code with codable children: Secondary | ICD-10-CM

## 2015-02-10 NOTE — Telephone Encounter (Signed)
Pt aware of US and mammogram appointment Appt on 02/12/2015 at 09:15

## 2015-02-11 ENCOUNTER — Other Ambulatory Visit: Payer: Self-pay

## 2015-02-11 ENCOUNTER — Other Ambulatory Visit: Payer: Self-pay | Admitting: Internal Medicine

## 2015-02-11 DIAGNOSIS — N63 Unspecified lump in unspecified breast: Secondary | ICD-10-CM

## 2015-02-12 ENCOUNTER — Ambulatory Visit
Admission: RE | Admit: 2015-02-12 | Discharge: 2015-02-12 | Disposition: A | Discharge status: Home / Self Care | Payer: No Typology Code available for payment source | Source: Ambulatory Visit | Attending: Internal Medicine | Admitting: Internal Medicine

## 2015-02-12 DIAGNOSIS — N63 Unspecified lump in unspecified breast: Secondary | ICD-10-CM

## 2015-02-18 ENCOUNTER — Telehealth: Payer: Self-pay

## 2015-02-18 NOTE — Telephone Encounter (Signed)
-----   Message from Quentin Angst, MD sent at 02/18/2015  1:03 PM EDT ----- Please inform patient that her mammogram shows possible benign dense tissue in the left breast. Recommend follow-up mammogram in 6 months.

## 2015-02-18 NOTE — Telephone Encounter (Signed)
Nurse called patient, via interpreter, Amber Travis,  patient verified date of birth. Patient aware of mammogram results showing benign dense tissue in left breast. Patient clarified benign meaning "not bad". Patient agrees to a follow up mammogram in 6 months. Patient voices understanding and has no questions at this time.

## 2015-06-06 ENCOUNTER — Encounter: Payer: Self-pay | Admitting: Family Medicine

## 2015-06-06 ENCOUNTER — Ambulatory Visit: Payer: No Typology Code available for payment source | Attending: Family Medicine | Admitting: Family Medicine

## 2015-06-06 VITALS — BP 112/75 | HR 83 | Temp 98.5°F | Resp 18 | Ht 59.0 in | Wt 156.0 lb

## 2015-06-06 DIAGNOSIS — Z309 Encounter for contraceptive management, unspecified: Secondary | ICD-10-CM

## 2015-06-06 DIAGNOSIS — N926 Irregular menstruation, unspecified: Secondary | ICD-10-CM | POA: Insufficient documentation

## 2015-06-06 DIAGNOSIS — Z30011 Encounter for initial prescription of contraceptive pills: Secondary | ICD-10-CM

## 2015-06-06 DIAGNOSIS — N912 Amenorrhea, unspecified: Secondary | ICD-10-CM

## 2015-06-06 LAB — POCT URINE PREGNANCY: Preg Test, Ur: NEGATIVE

## 2015-06-06 MED ORDER — NORETHINDRONE ACET-ETHINYL EST 1-20 MG-MCG PO TABS: 1.0000 | ORAL_TABLET | Freq: Every day | ORAL | Status: DC

## 2015-06-06 NOTE — Assessment & Plan Note (Signed)
Patient who missed her menses when expected on the 11th of this month; previous menses Aug 11th was as expected.  Has not missed menses since starting OCPs 2 years ago, wants to avoid pregnancy.  She is now 20 days late for her menses, making the results of her urine pregnancy test (negative today and at home) reliable.  Discussed that she may repeat the urine pregnancy test at home in 1 week if she wishes.  No signs or symptoms of pregnancy.  To continue taking the OCPs as she has been doing.

## 2015-06-06 NOTE — Patient Instructions (Signed)
Fue un Research officer, trade union.  Segun la prueba de [place], no esta' embarazada.   Continue tomando las [employer] esta tomando, todos [place].  Puede repetir la prueba en la casa en Amber Travis (a partir del dia 7 de octubre) para Astronomer.

## 2015-06-06 NOTE — Progress Notes (Signed)
   Subjective:    Patient ID: Amber Travis, female    DOB: Mar 11, 1981, 34 y.o.   MRN: [id number]  HPI  LMP 04/17/2015; taking OCP every day without fail.  Didn't have menses in Sept (was expecting on 11th of the month according to [facility]).  She took a home pregnancy test 1 week ago which was negative.  She shows me the package of her Loestrin 21, which contains a blister pack with three rows of white tablets (7 tabs per row), none of other colors.  She reports that she takes one tablet daily, cannot tell me where on the package she usually is when she begins her menses.  Does not hold a week at the end of the 21-day package.   She is a G54P4, oldest son is 71 yrs and youngest son 42 years old. Is taking OCP consistently since birth of her youngest son.   ROS: Denies fever or chills, denies nausea or vomiting.  Usual menses last for 6 days and moderate flow.  No other irregularities in menstruation since taking OCPs.  Denies nipple tenderness. No urinary symptoms.   Review of Systems     Objective:   Physical Exam Generally well appearing, nervous appearing but no apparent distress.   Urine pregnancy test is negative today.         Assessment & Plan:

## 2015-06-06 NOTE — Progress Notes (Signed)
Pt's here for irregular menses. She normally expects her menes around the 11th of the month, but missed her cycle for Sept. Pt don't think she's pregnant.

## 2015-06-11 ENCOUNTER — Ambulatory Visit: Payer: No Typology Code available for payment source | Attending: Family Medicine

## 2015-11-03 ENCOUNTER — Other Ambulatory Visit: Payer: Self-pay | Admitting: Internal Medicine

## 2015-11-03 DIAGNOSIS — N6320 Unspecified lump in the left breast, unspecified quadrant: Secondary | ICD-10-CM

## 2015-11-06 ENCOUNTER — Other Ambulatory Visit: Payer: Self-pay

## 2015-11-06 ENCOUNTER — Other Ambulatory Visit: Payer: Self-pay | Admitting: Internal Medicine

## 2015-11-06 DIAGNOSIS — N6320 Unspecified lump in the left breast, unspecified quadrant: Secondary | ICD-10-CM

## 2015-11-07 ENCOUNTER — Ambulatory Visit
Admission: RE | Admit: 2015-11-07 | Discharge: 2015-11-07 | Disposition: A | Discharge status: Home / Self Care | Payer: No Typology Code available for payment source | Source: Ambulatory Visit | Attending: Internal Medicine | Admitting: Internal Medicine

## 2015-11-07 DIAGNOSIS — N6320 Unspecified lump in the left breast, unspecified quadrant: Secondary | ICD-10-CM

## 2015-11-20 ENCOUNTER — Telehealth: Payer: Self-pay | Admitting: *Deleted

## 2015-11-20 NOTE — Telephone Encounter (Signed)
Medical Assistant used [employer] to contact patient.  Interpreter Name: Hilma FavorsJose Interpreter #: [phone]  Patient made aware of mammogram showing no evidence of malignancy in her left breast. Patient advised to begin annual screening at age 89. Patient expressed her understanding and had no further questions at this time.

## 2015-11-20 NOTE — Telephone Encounter (Signed)
-----   Message from Quentin Angstlugbemiga E Jegede, MD sent at 11/07/2015  3:20 PM EST ----- Please inform patient that her mammogram shows: No mammographic evidence of malignancy in the left breast. Recommend annual routine screening mammography beginning at age 35.

## 2015-11-24 ENCOUNTER — Ambulatory Visit: Payer: No Typology Code available for payment source

## 2016-02-13 ENCOUNTER — Ambulatory Visit: Payer: Self-pay | Attending: Internal Medicine

## 2016-05-09 ENCOUNTER — Observation Stay (HOSPITAL_COMMUNITY)
Admission: EM | Admit: 2016-05-09 | Discharge: 2016-05-10 | Disposition: A | Discharge status: Home / Self Care | Payer: Self-pay | Attending: General Surgery | Admitting: General Surgery

## 2016-05-09 ENCOUNTER — Encounter (HOSPITAL_COMMUNITY): Payer: Self-pay | Admitting: *Deleted

## 2016-05-09 ENCOUNTER — Emergency Department (HOSPITAL_COMMUNITY): Payer: Self-pay

## 2016-05-09 DIAGNOSIS — K353 Acute appendicitis with localized peritonitis, without perforation or gangrene: Secondary | ICD-10-CM

## 2016-05-09 DIAGNOSIS — K37 Unspecified appendicitis: Secondary | ICD-10-CM | POA: Diagnosis present

## 2016-05-09 DIAGNOSIS — D72829 Elevated white blood cell count, unspecified: Secondary | ICD-10-CM

## 2016-05-09 DIAGNOSIS — Z793 Long term (current) use of hormonal contraceptives: Secondary | ICD-10-CM | POA: Insufficient documentation

## 2016-05-09 LAB — CBC
HCT: 40.6 % (ref 36.0–46.0)
HEMOGLOBIN: 14 g/dL (ref 12.0–15.0)
MCH: 31.3 pg (ref 26.0–34.0)
MCHC: 34.5 g/dL (ref 30.0–36.0)
MCV: 90.6 fL (ref 78.0–100.0)
Platelets: 241 10*3/uL (ref 150–400)
RBC: 4.48 MIL/uL (ref 3.87–5.11)
RDW: 12.3 % (ref 11.5–15.5)
WBC: 17.1 10*3/uL — ABNORMAL HIGH (ref 4.0–10.5)

## 2016-05-09 LAB — URINALYSIS, ROUTINE W REFLEX MICROSCOPIC
BILIRUBIN URINE: NEGATIVE
GLUCOSE, UA: NEGATIVE mg/dL
Ketones, ur: 15 mg/dL — AB
Leukocytes, UA: NEGATIVE
Nitrite: NEGATIVE
PH: 8.5 — AB (ref 5.0–8.0)
Protein, ur: NEGATIVE mg/dL
SPECIFIC GRAVITY, URINE: 1.024 (ref 1.005–1.030)

## 2016-05-09 LAB — COMPREHENSIVE METABOLIC PANEL
ALBUMIN: 4.3 g/dL (ref 3.5–5.0)
ALK PHOS: 56 U/L (ref 38–126)
ALT: 17 U/L (ref 14–54)
ANION GAP: 9 (ref 5–15)
AST: 22 U/L (ref 15–41)
BUN: 13 mg/dL (ref 6–20)
CALCIUM: 9.5 mg/dL (ref 8.9–10.3)
CO2: 26 mmol/L (ref 22–32)
Chloride: 101 mmol/L (ref 101–111)
Creatinine, Ser: 0.69 mg/dL (ref 0.44–1.00)
GFR calc Af Amer: >60 mL/min (ref >60)
GFR calc non Af Amer: >60 mL/min (ref >60)
GLUCOSE: 129 mg/dL — AB (ref 65–99)
Potassium: 3.2 mmol/L — ABNORMAL LOW (ref 3.5–5.1)
SODIUM: 136 mmol/L (ref 135–145)
Total Bilirubin: 1.2 mg/dL (ref 0.3–1.2)
Total Protein: 7.3 g/dL (ref 6.5–8.1)

## 2016-05-09 LAB — POC URINE PREG, ED: PREG TEST UR: NEGATIVE

## 2016-05-09 LAB — URINE MICROSCOPIC-ADD ON: WBC, UA: NONE SEEN WBC/hpf (ref 0–5)

## 2016-05-09 LAB — LIPASE, BLOOD: Lipase: 25 U/L (ref 11–51)

## 2016-05-09 IMAGING — CT CT ABD-PELV W/ CM
2 of 4 series · 17 of 46 positions shown, 19 images · IV contrast (Omni 300)
Comparison: None.

CLINICAL DATA: 35-year-old female with abdominal pain and vomiting

EXAM:
CT ABDOMEN AND PELVIS WITH CONTRAST
TECHNIQUE: Multidetector CT imaging of the abdomen and pelvis was performed
using the standard protocol following bolus administration of
intravenous contrast.
CONTRAST:  1 [U1] IOPAMIDOL ([U1]) INJECTION 61%

[Series 2: a/p w/ 5mm · axial · 0.73mm/px · z∈[-481,-41]mm · 14 of 97 slices shown, 16 images]
[im 5/97  soft-tissue]
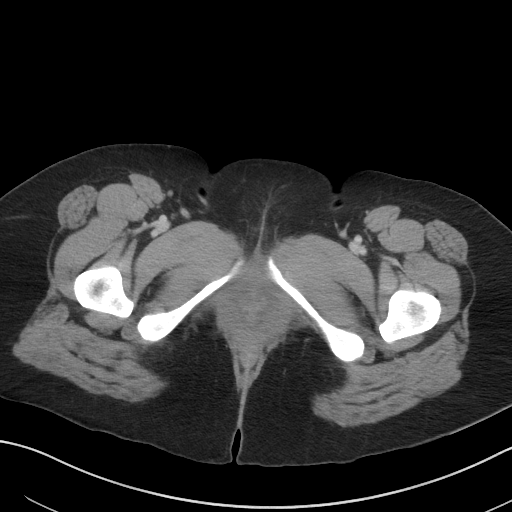
[im 5/97  bone]
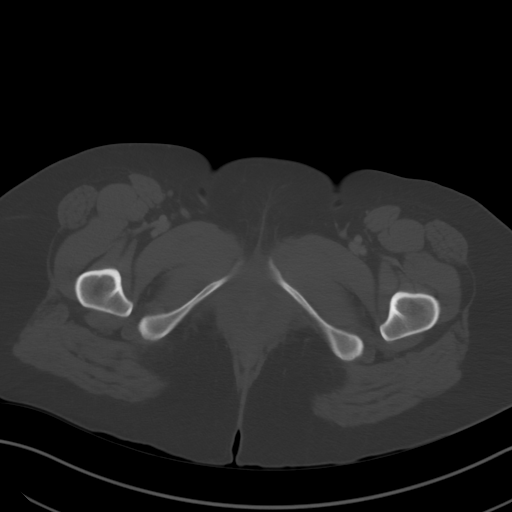
[im 13/97  soft-tissue]
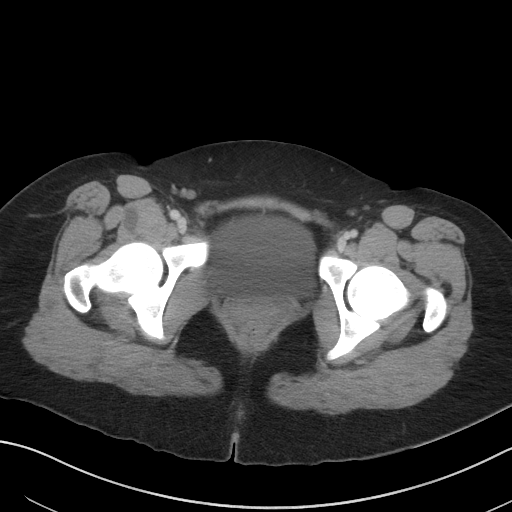
[im 21/97  soft-tissue]
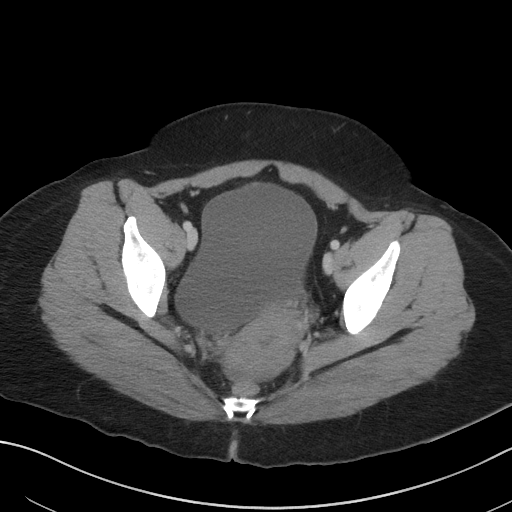
[im 25/97  soft-tissue]
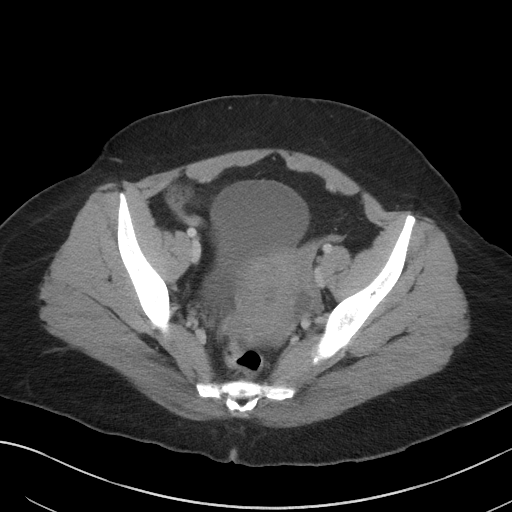
[im 33/97  soft-tissue]
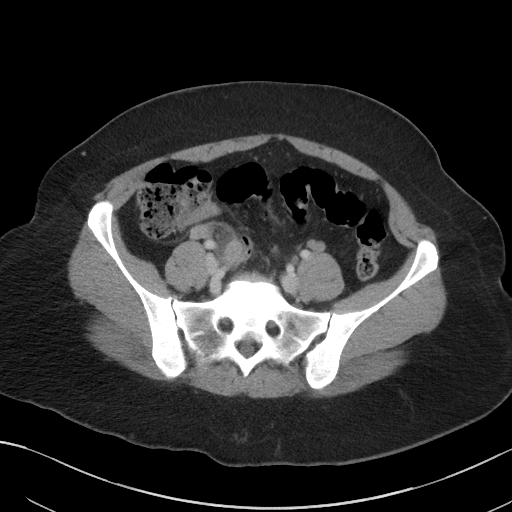
[im 41/97  soft-tissue]
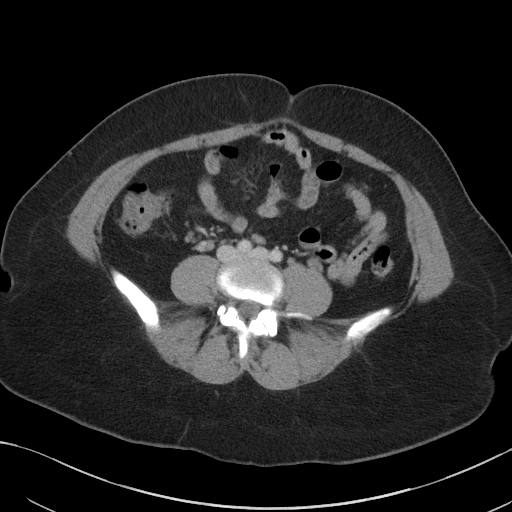
[im 45/97  soft-tissue]
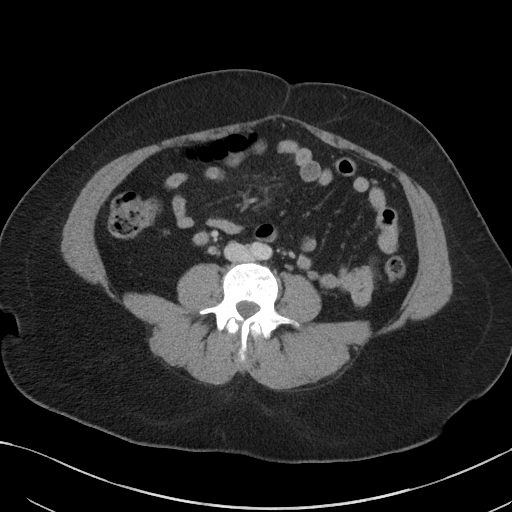
[im 53/97  soft-tissue]
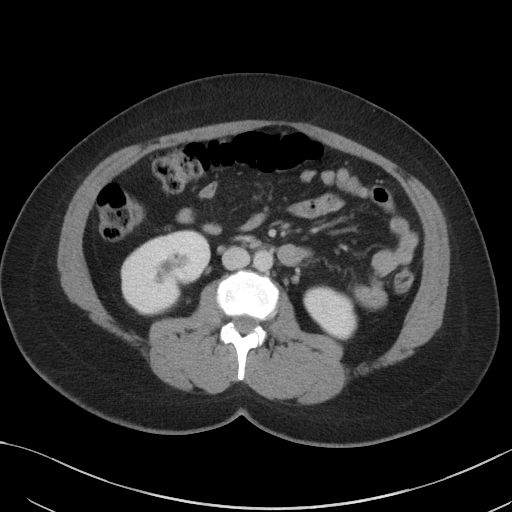
[im 57/97  soft-tissue]
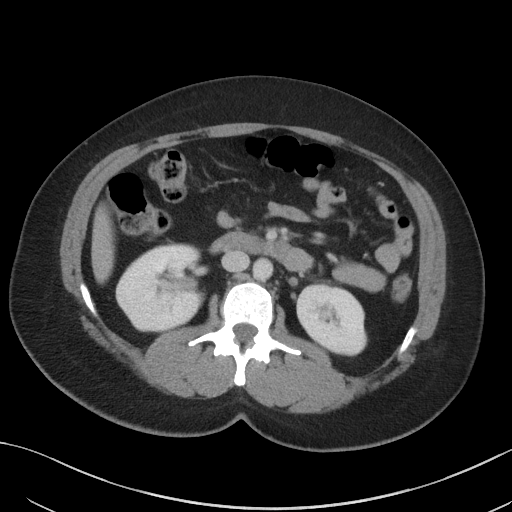
[im 57/97  bone]
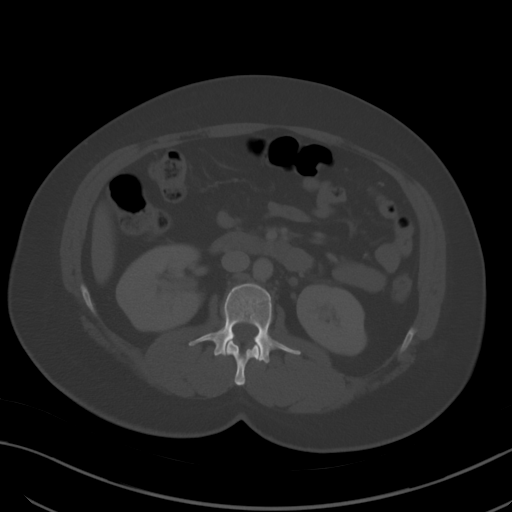
[im 65/97  soft-tissue]
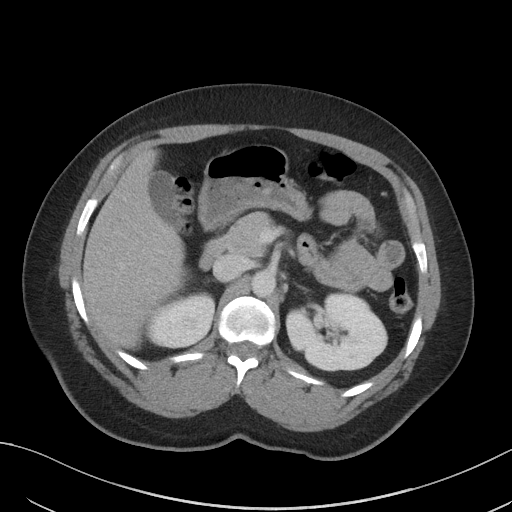
[im 73/97  soft-tissue]
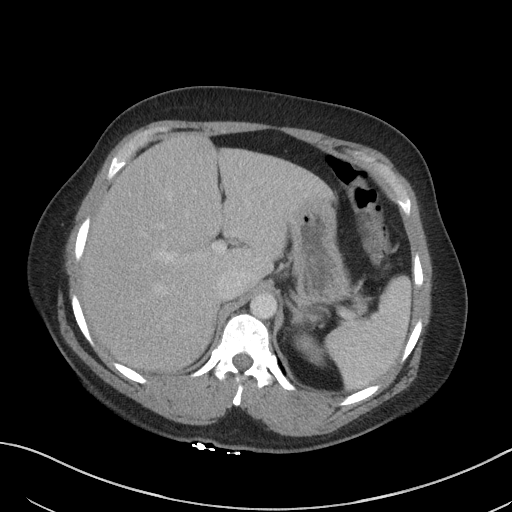
[im 77/97  soft-tissue]
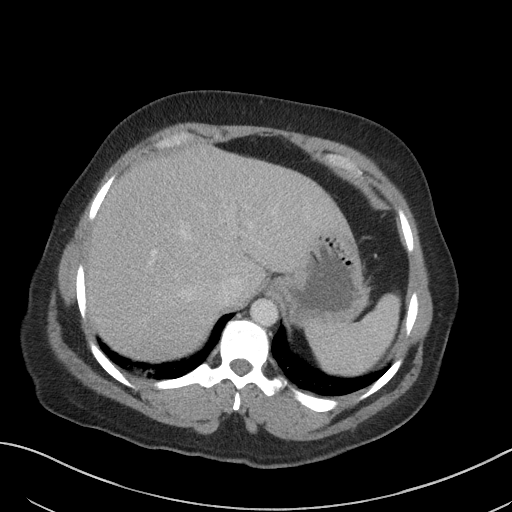
[im 85/97  soft-tissue]
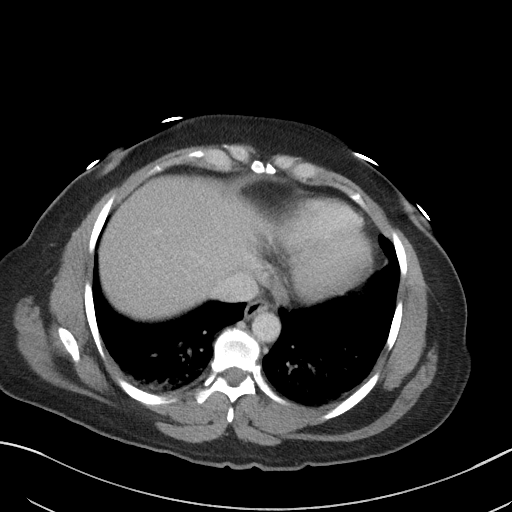
[im 93/97  soft-tissue]
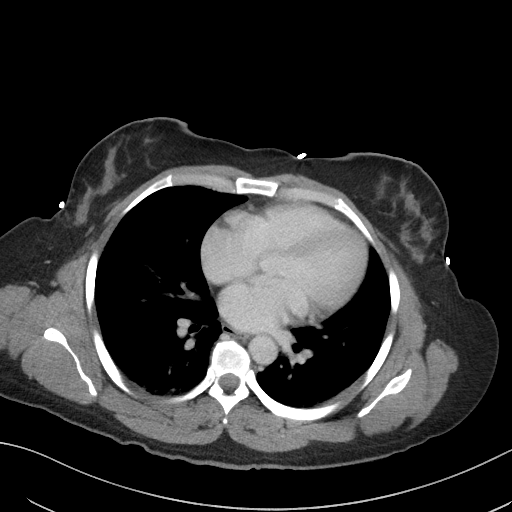

[Series 5: a/p w/ cor · coronal · 0.92mm/px · 3 of 133 slices shown]
[im 45/133  soft-tissue]
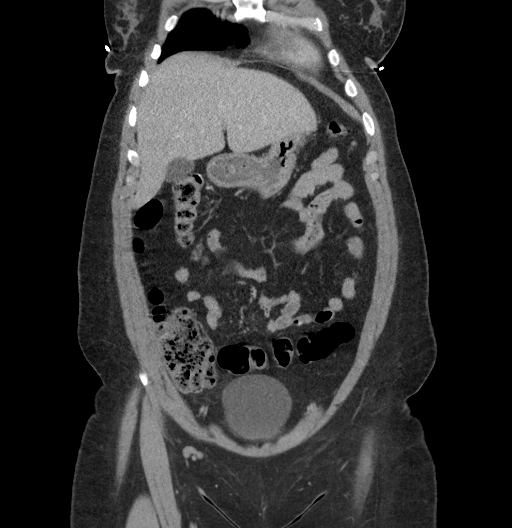
[im 59/133  soft-tissue]
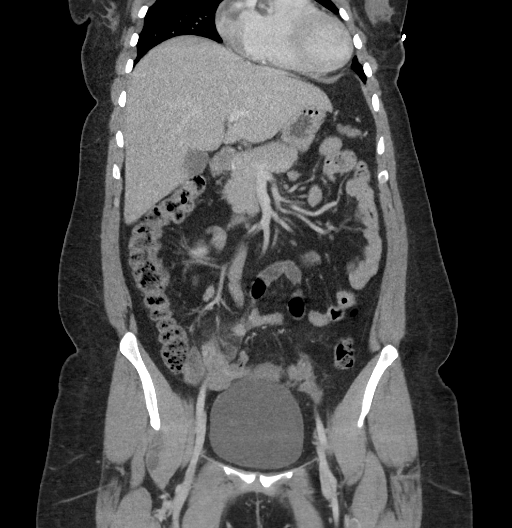
[im 74/133  soft-tissue]
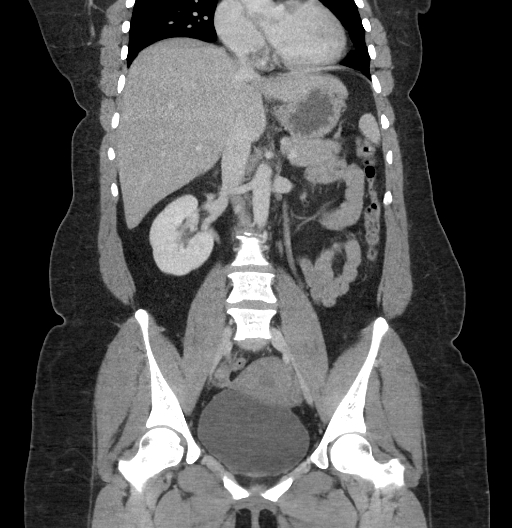

[17 of 46 positions shown; findings below may reference images not displayed]

FINDINGS: Minimal bibasilar dependent atelectatic changes of the lung bases.
No intra-abdominal free air. Trace free fluid may be present within
the pelvis.

The liver, gallbladder, pancreas, spleen, adrenal glands, kidneys,
visualized ureters, and urinary bladder appear unremarkable.
Subcentimeter left renal upper pole hypodensity is too small to
characterize. The uterus is anteverted and grossly unremarkable.
Bilateral ovarian follicles noted.

There is no evidence of bowel obstruction. There is inflammatory
changes and enlargement of the mid and distal portion of the
appendix compatible with acute appendicitis. The appendix is located
medial to the cecum and extends into the right hemipelvis. No
drainable fluid collection or abscess or evidence of perforation.

The aorta and IVC appear unremarkable. No portal venous gas
identified. There is no adenopathy. There is a small fat containing
umbilical hernia. The abdominal wall soft tissues are otherwise
unremarkable. A 1.0 x 1.3 cm intramuscular low attenuating lesion
anterior to the right femoral head is not well evaluated but may
represent a complex cystic structures. An intramural hematoma is
less likely. The osseous structures are intact.
IMPRESSION: Acute appendicitis.  No abscess or perforation.

## 2016-05-09 MED ORDER — IOPAMIDOL (ISOVUE-300) INJECTION 61%
INTRAVENOUS | Status: AC
  Administered 2016-05-09: 100 mL
  Filled 2016-05-09: qty 100

## 2016-05-09 MED ORDER — HYDROMORPHONE HCL 1 MG/ML IJ SOLN
1.0000 mg | Freq: Once | INTRAMUSCULAR | Status: AC
  Administered 2016-05-09: 1 mg via INTRAVENOUS
  Filled 2016-05-09: qty 1

## 2016-05-09 MED ORDER — ONDANSETRON HCL 4 MG/2ML IJ SOLN
4.0000 mg | Freq: Once | INTRAMUSCULAR | Status: AC
  Administered 2016-05-09: 4 mg via INTRAVENOUS
  Filled 2016-05-09: qty 2

## 2016-05-09 MED ORDER — SODIUM CHLORIDE 0.9 % IV BOLUS (SEPSIS)
1000.0000 mL | Freq: Once | INTRAVENOUS | Status: AC
  Administered 2016-05-09: 1000 mL via INTRAVENOUS

## 2016-05-09 NOTE — ED Notes (Signed)
Patient transported to CT 

## 2016-05-09 NOTE — ED Provider Notes (Signed)
[facility]-EMERGENCY DEPT Provider Note   CSN: [id number] Arrival date & time: 05/09/16  1934     History   Chief Complaint Chief Complaint  Patient presents with  . Abdominal Pain    HPI Amber Travis is a 35 y.o. female.  The history is provided by the patient. A language interpreter was used.  Abdominal Pain   This is a new problem. The current episode started 12 to 24 hours ago. The problem occurs constantly. The problem has been gradually worsening. The pain is associated with eating. The pain is located in the generalized abdominal region and RLQ. The quality of the pain is aching, cramping, sharp and tearing. The pain is at a severity of 10/10. The pain is severe. Associated symptoms include fever, nausea and vomiting. Pertinent negatives include anorexia, diarrhea, dysuria and headaches. The symptoms are aggravated by palpation and eating. Nothing relieves the symptoms. Her past medical history does not include gallstones.    Past Medical History:  Diagnosis Date  . No pertinent past medical history     Patient Active Problem List   Diagnosis Date Noted  . Amenorrhea 06/06/2015  . Blurred vision, bilateral 02/04/2015  . Breast lump in female 02/04/2015  . Secondary amenorrhea 02/20/2014  . DENTAL CARIES 10/07/2010  . HYPERBILIRUBINEMIA 07/09/2010  . URINALYSIS, ABNORMAL 05/17/2010  . SKIN RASH 05/14/2010  . ANXIETY DEPRESSION 05/04/2010  . FIBROCYSTIC BREAST DISEASE 05/04/2010  . CANDIDIASIS OF VULVA AND VAGINA 04/17/2009  . UNSPECIFIED ADJUSTMENT REACTION 04/17/2009  . MIGRAINE HEADACHE 01/27/2009  . DYSURIA 01/15/2009  . DEPRESSION, MILD 06/11/2008    History reviewed. No pertinent surgical history.  OB History    Gravida Para Term Preterm AB Living   5 4 4   1 4    SAB TAB Ectopic Multiple Live Births           4       Home Medications    Prior to Admission medications   Medication Sig Start Date End Date Taking? Authorizing Provider  ciprofloxacin  (CIPRO) 500 MG tablet Take 1 tablet (500 mg total) by mouth 2 (two) times daily. Patient not taking: Reported on 02/04/2015 02/25/14   Quentin Angst, MD  norethindrone-ethinyl estradiol (MICROGESTIN,JUNEL,LOESTRIN) 1-20 MG-MCG tablet Take 1 tablet by mouth daily. 06/06/15   Barbaraann Barthel, MD  Prenatal Vit-Fe Fumarate-FA (PRENATAL MULTIVITAMIN) TABS Take 1 tablet by mouth daily.    Historical Provider, MD    Family History Family History  Problem Relation Age of Onset  . Diabetes Paternal Aunt     Social History Social History  Substance Use Topics  . Smoking status: Never Smoker  . Smokeless tobacco: Never Used  . Alcohol use No     Allergies   Review of patient's allergies indicates no known allergies.   Review of Systems Review of Systems  Constitutional: Positive for fever. Negative for chills.  HENT: Negative for congestion, rhinorrhea and sore throat.   Eyes: Negative for visual disturbance.  Respiratory: Negative for cough, shortness of breath and wheezing.   Cardiovascular: Negative for chest pain and leg swelling.  Gastrointestinal: Positive for abdominal pain, nausea and vomiting. Negative for anorexia and diarrhea.  Genitourinary: Negative for dysuria, flank pain, vaginal bleeding and vaginal discharge.  Musculoskeletal: Negative for neck pain.  Skin: Negative for rash.  Allergic/Immunologic: Negative for immunocompromised state.  Neurological: Negative for syncope and headaches.  Hematological: Does not bruise/bleed easily.  All other systems reviewed and are negative.    Physical  Exam Updated Vital Signs BP 110/71   Pulse 102   Temp 98 F (36.7 C) (Oral)   Resp 20   Ht 4\' 11"  (1.499 m)   Wt 165 lb (74.8 kg)   LMP 04/22/2016   SpO2 97%   BMI 33.33 kg/m   Physical Exam  Constitutional: She is oriented to person, place, and time. She appears well-developed and well-nourished. No distress.  HENT:  Head: Normocephalic and atraumatic.  Eyes:  Conjunctivae are normal.  Neck: Neck supple.  Cardiovascular: Normal rate, regular rhythm and normal heart sounds.  Exam reveals no friction rub.   No murmur heard. Pulmonary/Chest: Effort normal and breath sounds normal. No respiratory distress. She has no wheezes. She has no rales.  Abdominal: Soft. Normal appearance. She exhibits no distension. There is tenderness in the right lower quadrant and periumbilical area. There is guarding. There is no rigidity, no rebound and no CVA tenderness.  Musculoskeletal: She exhibits no edema.  Neurological: She is alert and oriented to person, place, and time. She exhibits normal muscle tone.  Skin: Skin is warm. Capillary refill takes less than 2 seconds.  Psychiatric: She has a normal mood and affect.  Nursing note and vitals reviewed.    ED Treatments / Results  Labs (all labs ordered are listed, but only abnormal results are displayed) Labs Reviewed  COMPREHENSIVE METABOLIC PANEL - Abnormal; Notable for the following:       Result Value   Potassium 3.2 (*)    Glucose, Bld 129 (*)    All other components within normal limits  CBC - Abnormal; Notable for the following:    WBC 17.1 (*)    All other components within normal limits  URINALYSIS, ROUTINE W REFLEX MICROSCOPIC (NOT AT [facility]) - Abnormal; Notable for the following:    APPearance CLOUDY (*)    pH 8.5 (*)    Hgb urine dipstick TRACE (*)    Ketones, ur 15 (*)    All other components within normal limits  URINE MICROSCOPIC-ADD ON - Abnormal; Notable for the following:    Squamous Epithelial / LPF 0-5 (*)    Bacteria, UA RARE (*)    All other components within normal limits  LIPASE, BLOOD  POC URINE PREG, ED    EKG  EKG Interpretation None       Radiology Ct Abdomen Pelvis W Contrast  Result Date: 05/10/2016 CLINICAL DATA:  35 year old female with abdominal pain and vomiting EXAM: CT ABDOMEN AND PELVIS WITH CONTRAST TECHNIQUE: Multidetector CT imaging of the abdomen and  pelvis was performed using the standard protocol following bolus administration of intravenous contrast. CONTRAST:  1 ISOVUE-300 IOPAMIDOL (ISOVUE-300) INJECTION 61% COMPARISON:  None. FINDINGS: Minimal bibasilar dependent atelectatic changes of the lung bases. No intra-abdominal free air. Trace free fluid may be present within the pelvis. The liver, gallbladder, pancreas, spleen, adrenal glands, kidneys, visualized ureters, and urinary bladder appear unremarkable. Subcentimeter left renal upper pole hypodensity is too small to characterize. The uterus is anteverted and grossly unremarkable. Bilateral ovarian follicles noted. There is no evidence of bowel obstruction. There is inflammatory changes and enlargement of the mid and distal portion of the appendix compatible with acute appendicitis. The appendix is located medial to the cecum and extends into the right hemipelvis. No drainable fluid collection or abscess or evidence of perforation. The aorta and IVC appear unremarkable. No portal venous gas identified. There is no adenopathy. There is a small fat containing umbilical hernia. The abdominal wall soft tissues are  otherwise unremarkable. A 1.0 x 1.3 cm intramuscular low attenuating lesion anterior to the right femoral head is not well evaluated but may represent a complex cystic structures. An intramural hematoma is less likely. The osseous structures are intact. IMPRESSION: Acute appendicitis.  No abscess or perforation. Electronically Signed   By: Elgie CollardArash  Radparvar M.D.   On: 05/10/2016 00:21    Procedures Procedures (including critical care time)  Medications Ordered in ED Medications  sodium chloride 0.9 % bolus 1,000 mL (0 mLs Intravenous Stopped 05/09/16 2304)  HYDROmorphone (DILAUDID) injection 1 mg (1 mg Intravenous Given 05/09/16 2221)  ondansetron (ZOFRAN) injection 4 mg (4 mg Intravenous Given 05/09/16 2221)  HYDROmorphone (DILAUDID) injection 1 mg (1 mg Intravenous Given 05/09/16 2305)  sodium  chloride 0.9 % bolus 1,000 mL (0 mLs Intravenous Stopped 05/10/16 0020)  iopamidol (ISOVUE-300) 61 % injection (100 mLs  Contrast Given 05/09/16 2346)  HYDROmorphone (DILAUDID) injection 1 mg (1 mg Intravenous Given 05/10/16 0017)  ondansetron (ZOFRAN) injection 4 mg (4 mg Intravenous Given 05/10/16 0017)     Initial Impression / Assessment and Plan / ED Course  I have reviewed the triage vital signs and the nursing notes.  Pertinent labs & imaging results that were available during my care of the patient were reviewed by me and considered in my medical decision making (see chart for details).  Clinical Course    35 yo F with no significant PMHx who p/w a 2-day h/o initially periumbilical and now generalized, severe abdominal pain. On arrival, VSS and WNL. Screening lab work obtained in triage reviewed, remarkable for leukocytosis of 17k. UA shows no signs of UTI or stone. CMP unremarkable. Initial DDx includes: appendicitis, severe colitis/diverticulitis, viral GI illness. Less likely PID. No adnexal pain or signs of TOA or torsion. Will check CT scan, re-assess.  CT scan confirms acute appendicitis without rupture. Discussed with Dr. Magnus IvanBlackman. Will admit. Pt NPO, he will order ABX if indicated. Pt remains in pain but HDS.  Final Clinical Impressions(s) / ED Diagnoses   Final diagnoses:  Acute appendicitis with localized peritonitis  Leukocytosis      Shaune Pollackameron Brandis Matsuura, MD 05/10/16 [phone]

## 2016-05-09 NOTE — ED Notes (Signed)
Pt transported to CT ?

## 2016-05-09 NOTE — ED Triage Notes (Signed)
The pt is c/o abd pain since yesterday with n vomioting  lmp  lmp aug 18th

## 2016-05-10 ENCOUNTER — Observation Stay (HOSPITAL_COMMUNITY): Payer: Self-pay | Admitting: Anesthesiology

## 2016-05-10 ENCOUNTER — Encounter (HOSPITAL_COMMUNITY)
Admission: EM | Disposition: A | Discharge status: Home / Self Care | Payer: Self-pay | Source: Home / Self Care | Attending: Emergency Medicine

## 2016-05-10 ENCOUNTER — Encounter (HOSPITAL_COMMUNITY): Payer: Self-pay | Admitting: Certified Registered Nurse Anesthetist

## 2016-05-10 DIAGNOSIS — K37 Unspecified appendicitis: Secondary | ICD-10-CM | POA: Diagnosis present

## 2016-05-10 HISTORY — PX: LAPAROSCOPIC APPENDECTOMY: SHX408

## 2016-05-10 LAB — SURGICAL PCR SCREEN
MRSA, PCR: NEGATIVE
STAPHYLOCOCCUS AUREUS: NEGATIVE

## 2016-05-10 SURGERY — APPENDECTOMY, LAPAROSCOPIC
Anesthesia: General | Site: Abdomen

## 2016-05-10 MED ORDER — DIPHENHYDRAMINE HCL 50 MG/ML IJ SOLN
INTRAMUSCULAR | Status: AC
  Filled 2016-05-10: qty 1

## 2016-05-10 MED ORDER — ACETAMINOPHEN 325 MG PO TABS: 650.0000 mg | ORAL_TABLET | Freq: Four times a day (QID) | ORAL | Status: DC | PRN

## 2016-05-10 MED ORDER — SUCCINYLCHOLINE CHLORIDE 200 MG/10ML IV SOSY
PREFILLED_SYRINGE | INTRAVENOUS | Status: AC
  Filled 2016-05-10: qty 10

## 2016-05-10 MED ORDER — PROPOFOL 10 MG/ML IV BOLUS
INTRAVENOUS | Status: DC | PRN
  Administered 2016-05-10: 150 mg via INTRAVENOUS

## 2016-05-10 MED ORDER — MORPHINE SULFATE (PF) 2 MG/ML IV SOLN: 1.0000 mg | INTRAVENOUS | Status: DC | PRN

## 2016-05-10 MED ORDER — MIDAZOLAM HCL 2 MG/2ML IJ SOLN
INTRAMUSCULAR | Status: AC
  Filled 2016-05-10: qty 2

## 2016-05-10 MED ORDER — DEXTROSE 5 % IV SOLN
2.0000 g | INTRAVENOUS | Status: DC
  Administered 2016-05-10: 2 g via INTRAVENOUS
  Filled 2016-05-10: qty 2

## 2016-05-10 MED ORDER — ROCURONIUM BROMIDE 10 MG/ML (PF) SYRINGE
PREFILLED_SYRINGE | INTRAVENOUS | Status: AC
  Filled 2016-05-10: qty 10

## 2016-05-10 MED ORDER — ONDANSETRON HCL 4 MG/2ML IJ SOLN
INTRAMUSCULAR | Status: AC
  Filled 2016-05-10: qty 2

## 2016-05-10 MED ORDER — FENTANYL CITRATE (PF) 100 MCG/2ML IJ SOLN
INTRAMUSCULAR | Status: DC | PRN
  Administered 2016-05-10 (×2): 50 ug via INTRAVENOUS
  Administered 2016-05-10: 100 ug via INTRAVENOUS

## 2016-05-10 MED ORDER — BUPIVACAINE-EPINEPHRINE (PF) 0.25% -1:200000 IJ SOLN
INTRAMUSCULAR | Status: AC
  Filled 2016-05-10: qty 30

## 2016-05-10 MED ORDER — MIDAZOLAM HCL 5 MG/5ML IJ SOLN
INTRAMUSCULAR | Status: DC | PRN
  Administered 2016-05-10: 2 mg via INTRAVENOUS

## 2016-05-10 MED ORDER — DIPHENHYDRAMINE HCL 50 MG/ML IJ SOLN
INTRAMUSCULAR | Status: DC | PRN
  Administered 2016-05-10: 12.5 mg via INTRAVENOUS

## 2016-05-10 MED ORDER — SUCCINYLCHOLINE CHLORIDE 20 MG/ML IJ SOLN
INTRAMUSCULAR | Status: DC | PRN
  Administered 2016-05-10: 120 mg via INTRAVENOUS

## 2016-05-10 MED ORDER — ONDANSETRON HCL 4 MG/2ML IJ SOLN: 4.0000 mg | Freq: Four times a day (QID) | INTRAMUSCULAR | Status: DC | PRN

## 2016-05-10 MED ORDER — PHENYLEPHRINE HCL 10 MG/ML IJ SOLN
INTRAMUSCULAR | Status: DC | PRN
  Administered 2016-05-10 (×2): 80 ug via INTRAVENOUS
  Administered 2016-05-10 (×2): 40 ug via INTRAVENOUS

## 2016-05-10 MED ORDER — PHENYLEPHRINE 40 MCG/ML (10ML) SYRINGE FOR IV PUSH (FOR BLOOD PRESSURE SUPPORT)
PREFILLED_SYRINGE | INTRAVENOUS | Status: AC
  Filled 2016-05-10: qty 10

## 2016-05-10 MED ORDER — SODIUM CHLORIDE 0.9 % IR SOLN
Status: DC | PRN
  Administered 2016-05-10: 1000 mL

## 2016-05-10 MED ORDER — BUPIVACAINE-EPINEPHRINE 0.25% -1:200000 IJ SOLN
INTRAMUSCULAR | Status: DC | PRN
  Administered 2016-05-10: 18 mL

## 2016-05-10 MED ORDER — ONDANSETRON HCL 4 MG/2ML IJ SOLN
4.0000 mg | Freq: Once | INTRAMUSCULAR | Status: AC
  Administered 2016-05-10: 4 mg via INTRAVENOUS
  Filled 2016-05-10: qty 2

## 2016-05-10 MED ORDER — NEOSTIGMINE METHYLSULFATE 5 MG/5ML IV SOSY
PREFILLED_SYRINGE | INTRAVENOUS | Status: AC
  Filled 2016-05-10: qty 5

## 2016-05-10 MED ORDER — OXYCODONE HCL 5 MG PO TABS
5.0000 mg | ORAL_TABLET | ORAL | Status: DC | PRN
Start: 2016-05-10 — End: 2016-05-10
  Administered 2016-05-10 (×2): 5 mg via ORAL
  Filled 2016-05-10 (×2): qty 1

## 2016-05-10 MED ORDER — LACTATED RINGERS IV SOLN
INTRAVENOUS | Status: DC | PRN
  Administered 2016-05-10 (×2): via INTRAVENOUS

## 2016-05-10 MED ORDER — ONDANSETRON HCL 4 MG/2ML IJ SOLN
INTRAMUSCULAR | Status: DC | PRN
  Administered 2016-05-10: 4 mg via INTRAVENOUS

## 2016-05-10 MED ORDER — LIDOCAINE 2% (20 MG/ML) 5 ML SYRINGE
INTRAMUSCULAR | Status: AC
  Filled 2016-05-10: qty 5

## 2016-05-10 MED ORDER — PROPOFOL 10 MG/ML IV BOLUS
INTRAVENOUS | Status: AC
  Filled 2016-05-10: qty 20

## 2016-05-10 MED ORDER — METRONIDAZOLE IN NACL 5-0.79 MG/ML-% IV SOLN
500.0000 mg | Freq: Three times a day (TID) | INTRAVENOUS | Status: DC
  Administered 2016-05-10: 500 mg via INTRAVENOUS
  Filled 2016-05-10 (×3): qty 100

## 2016-05-10 MED ORDER — HYDROCODONE-ACETAMINOPHEN 5-325 MG PO TABS: 1.0000 | ORAL_TABLET | Freq: Four times a day (QID) | ORAL | 0 refills | Status: DC | PRN

## 2016-05-10 MED ORDER — ENOXAPARIN SODIUM 40 MG/0.4ML ~~LOC~~ SOLN: 40.0000 mg | SUBCUTANEOUS | Status: DC

## 2016-05-10 MED ORDER — DEXAMETHASONE SODIUM PHOSPHATE 10 MG/ML IJ SOLN
INTRAMUSCULAR | Status: DC | PRN
  Administered 2016-05-10: 10 mg via INTRAVENOUS

## 2016-05-10 MED ORDER — GLYCOPYRROLATE 0.2 MG/ML IJ SOLN
INTRAMUSCULAR | Status: DC | PRN
  Administered 2016-05-10: 0.6 mg via INTRAVENOUS

## 2016-05-10 MED ORDER — 0.9 % SODIUM CHLORIDE (POUR BTL) OPTIME
TOPICAL | Status: DC | PRN
  Administered 2016-05-10: 1000 mL

## 2016-05-10 MED ORDER — ROCURONIUM BROMIDE 100 MG/10ML IV SOLN
INTRAVENOUS | Status: DC | PRN
  Administered 2016-05-10: 10 mg via INTRAVENOUS
  Administered 2016-05-10: 30 mg via INTRAVENOUS

## 2016-05-10 MED ORDER — SODIUM CHLORIDE 0.9 % IV SOLN: Freq: Once | INTRAVENOUS | Status: DC

## 2016-05-10 MED ORDER — POTASSIUM CHLORIDE IN NACL 20-0.9 MEQ/L-% IV SOLN
INTRAVENOUS | Status: DC
Start: 2016-05-10 — End: 2016-05-10
  Administered 2016-05-10: 02:00:00 via INTRAVENOUS
  Filled 2016-05-10: qty 1000

## 2016-05-10 MED ORDER — DEXAMETHASONE SODIUM PHOSPHATE 10 MG/ML IJ SOLN
INTRAMUSCULAR | Status: AC
  Filled 2016-05-10: qty 1

## 2016-05-10 MED ORDER — ACETAMINOPHEN 650 MG RE SUPP
650.0000 mg | Freq: Once | RECTAL | Status: AC
  Administered 2016-05-10: 650 mg via RECTAL
  Filled 2016-05-10: qty 1

## 2016-05-10 MED ORDER — LIDOCAINE HCL (CARDIAC) 20 MG/ML IV SOLN
INTRAVENOUS | Status: DC | PRN
  Administered 2016-05-10: 80 mg via INTRAVENOUS

## 2016-05-10 MED ORDER — ONDANSETRON 4 MG PO TBDP: 4.0000 mg | ORAL_TABLET | Freq: Four times a day (QID) | ORAL | Status: DC | PRN

## 2016-05-10 MED ORDER — GLYCOPYRROLATE 0.2 MG/ML IV SOSY
PREFILLED_SYRINGE | INTRAVENOUS | Status: AC
  Filled 2016-05-10: qty 3

## 2016-05-10 MED ORDER — FENTANYL CITRATE (PF) 100 MCG/2ML IJ SOLN
INTRAMUSCULAR | Status: AC
  Filled 2016-05-10: qty 2

## 2016-05-10 MED ORDER — NEOSTIGMINE METHYLSULFATE 10 MG/10ML IV SOLN
INTRAVENOUS | Status: DC | PRN
  Administered 2016-05-10: 4 mg via INTRAVENOUS

## 2016-05-10 MED ORDER — HYDROMORPHONE HCL 1 MG/ML IJ SOLN
1.0000 mg | Freq: Once | INTRAMUSCULAR | Status: AC
  Administered 2016-05-10: 1 mg via INTRAVENOUS
  Filled 2016-05-10: qty 1

## 2016-05-10 MED ORDER — POTASSIUM CHLORIDE IN NACL 20-0.9 MEQ/L-% IV SOLN
INTRAVENOUS | Status: DC
  Administered 2016-05-10: 10:00:00 via INTRAVENOUS

## 2016-05-10 SURGICAL SUPPLY — 42 items
APPLIER CLIP ROT 10 11.4 M/L (STAPLE)
BLADE SURG ROTATE 9660 (MISCELLANEOUS) IMPLANT
CANISTER SUCTION 2500CC (MISCELLANEOUS) ×3 IMPLANT
CHLORAPREP W/TINT 26ML (MISCELLANEOUS) ×3 IMPLANT
CLIP APPLIE ROT 10 11.4 M/L (STAPLE) IMPLANT
CLOSURE WOUND 1/2 X4 (GAUZE/BANDAGES/DRESSINGS) ×1
COVER SURGICAL LIGHT HANDLE (MISCELLANEOUS) ×3 IMPLANT
CUTTER FLEX LINEAR 45M (STAPLE) ×3 IMPLANT
DERMABOND ADVANCED (GAUZE/BANDAGES/DRESSINGS) ×2
DERMABOND ADVANCED .7 DNX12 (GAUZE/BANDAGES/DRESSINGS) ×1 IMPLANT
DRSG TEGADERM 2-3/8X2-3/4 SM (GAUZE/BANDAGES/DRESSINGS) ×9 IMPLANT
ELECT REM PT RETURN 9FT ADLT (ELECTROSURGICAL) ×3
ELECTRODE REM PT RTRN 9FT ADLT (ELECTROSURGICAL) ×1 IMPLANT
ENDOLOOP SUT PDS II  0 18 (SUTURE)
ENDOLOOP SUT PDS II 0 18 (SUTURE) IMPLANT
GLOVE BIOGEL PI IND STRL 7.0 (GLOVE) ×1 IMPLANT
GLOVE BIOGEL PI IND STRL 8 (GLOVE) ×1 IMPLANT
GLOVE BIOGEL PI INDICATOR 7.0 (GLOVE) ×2
GLOVE BIOGEL PI INDICATOR 8 (GLOVE) ×2
GLOVE ECLIPSE 7.5 STRL STRAW (GLOVE) ×3 IMPLANT
GOWN STRL REUS W/ TWL LRG LVL3 (GOWN DISPOSABLE) ×3 IMPLANT
GOWN STRL REUS W/TWL LRG LVL3 (GOWN DISPOSABLE) ×6
KIT BASIN OR (CUSTOM PROCEDURE TRAY) ×3 IMPLANT
KIT ROOM TURNOVER OR (KITS) ×3 IMPLANT
NS IRRIG 1000ML POUR BTL (IV SOLUTION) ×3 IMPLANT
PAD ARMBOARD 7.5X6 YLW CONV (MISCELLANEOUS) ×3 IMPLANT
POUCH SPECIMEN RETRIEVAL 10MM (ENDOMECHANICALS) ×3 IMPLANT
RELOAD 45 VASCULAR/THIN (ENDOMECHANICALS) IMPLANT
RELOAD STAPLE TA45 3.5 REG BLU (ENDOMECHANICALS) ×3 IMPLANT
SCALPEL HARMONIC ACE (MISCELLANEOUS) ×3 IMPLANT
SET IRRIG TUBING LAPAROSCOPIC (IRRIGATION / IRRIGATOR) ×3 IMPLANT
SLEEVE ENDOPATH XCEL 5M (ENDOMECHANICALS) ×3 IMPLANT
SPECIMEN JAR SMALL (MISCELLANEOUS) ×3 IMPLANT
STRIP CLOSURE SKIN 1/2X4 (GAUZE/BANDAGES/DRESSINGS) ×2 IMPLANT
SUT MNCRL AB 4-0 PS2 18 (SUTURE) ×3 IMPLANT
TOWEL OR 17X24 6PK STRL BLUE (TOWEL DISPOSABLE) ×3 IMPLANT
TOWEL OR 17X26 10 PK STRL BLUE (TOWEL DISPOSABLE) IMPLANT
TRAY FOLEY CATH 16FR SILVER (SET/KITS/TRAYS/PACK) ×3 IMPLANT
TRAY LAPAROSCOPIC MC (CUSTOM PROCEDURE TRAY) ×3 IMPLANT
TROCAR XCEL BLUNT TIP 100MML (ENDOMECHANICALS) ×3 IMPLANT
TROCAR XCEL NON-BLD 5MMX100MML (ENDOMECHANICALS) ×3 IMPLANT
TUBING INSUFFLATION (TUBING) ×3 IMPLANT

## 2016-05-10 NOTE — Discharge Instructions (Signed)
Apendectomía Laparoscópica - Adultos  (Laparoscopic Appendectomy, Adult)    Una apendicectomía es un procedimiento que se realiza para extirpar el apéndice. La cirugía laparoscópica emplea varios cortes pequeños (incisiones) en lugar de una incisión grande. La cirugía laparoscópica brinda un tiempo más breve de recuperación y menos molestias.   INFORME A SU MÉDICO:   · Alergias a alimentos o medicamentos.  · Medicamentos que utiliza, incluyendo vitaminas, hierbas, gotas oftálmicas, medicamentos de venta libre y cremas.  · Uso de corticoides (por vía oral o cremas).  · Problemas anteriores debido a anestésicos o a medicamentos que disminuyen la sensibilidad.  · Antecedentes de hemorragias o coágulos sanguíneos  · Cirugías anteriores.  · Otros problemas de salud, incluyendo diabetes, problemas cardíacos, pulmonares y renales.  · Posibilidad de embarazo, si corresponde.  RIESGOS Y COMPLICACIONES.   · Infecciones. Un germen comienza a desarrollarse en la herida. Generalmente se trata con antibióticos. En algunos casos, la herida debe abrirse y limpiarse.  · Hemorragias  · Lesiones en los órganos circundantes.  · Úlceras (abscesos).  · Dolor crónico en el sitio de la incisión. Se define como un dolor que dura más de 3 meses.  · Coágulos de sangre en las piernas que en algunos casos van hacia los pulmones.  · Infecciones en el pulmón (neumonía).  ANTES DEL PROCEDIMIENTO   La apendicectomía se realiza inmediatamente después de diagnosticar la inflamación del apéndice (apendicitis). No es necesario una preparación antes del procedimiento.   PROCEDIMIENTO     Le administrarán un medicamento que lo hará dormir (anestesia general). Una vez que se encuentre dormido, le colocarán un tubo flexible (catéter) en la vejiga para drenar la orina durante la cirugía. El tubo se retira antes de que usted se despierte de la anestesia. Cuando se encuentre dormido, le insuflarán dióxido de carbono en el abdomen. Esto le permitirá al  cirujano observar el interior del abdomen y llevar a cabo la cirugía. Le practicarán varias incisiones pequeñas en el abdomen. El cirujano insertará un tubo delgado y luminoso (laparoscopio) a través de una de las incisiones. El cirujano observará por el laparoscopio mientras realiza la cirugía. Se insertarán otros instrumentos quirúrgicos a través de las otras incisiones. La laparoscopia puede no ser apropiada cuando:   · Hay una cicatriz importante de una operación anterior.  · El paciente tiene trastornos hemorrágicos.  · Si tiene un embarazo en término.  · Hay otros problemas que hacen imposible el procedimiento laparoscópico, como en caso de una infección avanzada o la ruptura del apéndice.  Si el cirujano estima que no es seguro seguir con el procedimiento laparoscópico cambiará a una cirugía de abdomen abierto. Esto le dará una mayor visión y campo para trabajar. La cirugía abierta requiere un tiempo más prolongado de recuperación. Luego de extirpar el apéndice, le cerrarán las incisiones con puntos (suturas) o con un adhesivo para la piel.   DESPUÉS DEL PROCEDIMIENTO   Lo trasladarán una sala de recuperación. Cuando el efecto de la anestesia haya desaparecido, lo llevarán a su habitación en el hospital. Luego del procedimiento le administrarán medicamentos para el dolor para que se sienta confortable. Pregunte a su médico cuánto tiempo estará en el hospital.      Esta información no tiene como fin reemplazar el consejo del médico. Asegúrese de hacerle al médico cualquier pregunta que tenga.     Document Released: 09/12/2007 Document Revised: 09/13/2014  Elsevier Interactive Patient Education ©2016 [employer]   

## 2016-05-10 NOTE — Op Note (Signed)
OPERATIVE REPORT  DATE OF OPERATION:  05/10/2016  PATIENT:  Amber Travis  35 y.o. female  PRE-OPERATIVE DIAGNOSIS:  appendicitis  POST-OPERATIVE DIAGNOSIS: Acute suppurative appendicitis  INDICATIO FOR OPERATION:  Abdominal pain and CT scan demonstrating acute appendicitis  FINDINGS:  Acute suppurative appendicitis without perforation  PROCEDURE:  Procedure(s): APPENDECTOMY LAPAROSCOPIC  SURGEON:  Surgeon(s): Jimmye NormanJames Artice Bergerson, MD  ASSISTANT: None  ANESTHESIA:   general  COMPLICATIONS:  None  EBL: <10 ml  BLOOD ADMINISTERED: none  DRAINS: none   SPECIMEN:  Source of Specimen:  Appendix  COUNTS CORRECT:  YES  PROCEDURE DETAILS: The patient was taken to the operating room and placed on the table in supine position. After an adequate general endotracheal anesthetic was administered she was prepped and draped in the usual sterile manner exposing her entire abdomen.  A proper timeout was performed identifying the patient and the procedure to be performed. A supraumbilical midline incision was made using a #15 blade and taken down to the midline fascia which was subsequently incised. We bluntly dissected down into the peritoneal cavity through the fascial opening and then passed a pursestring suture of 0 Vicryl which secured in place a Hassan cannula for insufflation of carbon dioxide gas. We insufflated gas up to a maximal intra-abdominal pressure of 15 mmHg.  The patient was placed in Trendelenburg position and the left side was tilted down. Right upper quadrant 5 mm cannula and a left low quadrant 5 mm cannula pass under direct vision into the peritoneal cavity. The appendix could be seen curling off the base of the cecum down towards the pelvis. We are able to mobilize it adequately and demonstrate an exudate on the surface indicative of acute inflammation.  The mesoappendix was dissected free and controlled using a Harmonic Scalpel taking down the mesoappendix to the base of the  appendix and the cecum. Once this was done ended been skeletonized to the base of the cecum we came across the base of this cecum and the appendix using a laparoscopic Endo GIA with a blue cartridge stapler. This detached the appendix completely and we were able to retrieve it using an Endo Catch bag from the supraumbilical site.  We inspected the base of the appendix and the cecum line of staple resection for bleeding and no some mild bleeding which was cauterized. We then irrigated with saline solution, aspirated the fluid and gas from above the liver, place the patient in neutral position, and then removed all instruments and closed.  The supraumbilical fascial site was closed using the pursestring suture which was in place. The skin was closed using running subcuticular stitch of 4-0 Monocryl at the super umbilical site only. The other sites were closed with Dermabond. 0.25% Marcaine with epinephrine was injected at all sites. Final dressings were Steri-Strips and Tegaderms. All needle counts, sponge counts, and isthmic counts were correct.  PATIENT DISPOSITION:  PACU - hemodynamically stable.   Amber Travis 9/4/20179:03 AM

## 2016-05-10 NOTE — Progress Notes (Signed)
Patient discharge home with instructions written in spanish and english, family member and nurse tech help with translation.

## 2016-05-10 NOTE — Anesthesia Procedure Notes (Signed)
Procedure Name: Intubation Date/Time: 05/10/2016 7:53 AM Performed by: Rise PatienceBELL, Rachelann Enloe T Pre-anesthesia Checklist: Patient identified, Emergency Drugs available, Suction available and Patient being monitored Patient Re-evaluated:Patient Re-evaluated prior to inductionOxygen Delivery Method: Circle System Utilized Preoxygenation: Pre-oxygenation with 100% oxygen Intubation Type: IV induction and Rapid sequence Laryngoscope Size: Miller and 2 Grade View: Grade I Tube type: Oral Tube size: 7.0 mm Number of attempts: 1 Airway Equipment and Method: Stylet Placement Confirmation: ETT inserted through vocal cords under direct vision,  positive ETCO2 and breath sounds checked- equal and bilateral Secured at: 22 cm Tube secured with: Tape Dental Injury: Teeth and Oropharynx as per pre-operative assessment

## 2016-05-10 NOTE — Anesthesia Postprocedure Evaluation (Signed)
Anesthesia Post Note  Patient: Amber SawyersMarisol E Travis  Procedure(s) Performed: Procedure(s) (LRB): APPENDECTOMY LAPAROSCOPIC (N/A)  Patient location during evaluation: PACU Anesthesia Type: General Level of consciousness: sedated Pain management: pain level controlled Vital Signs Assessment: post-procedure vital signs reviewed and stable Respiratory status: spontaneous breathing and respiratory function stable Cardiovascular status: stable Anesthetic complications: no    Last Vitals:  Vitals:   05/10/16 0930 05/10/16 0944  BP: (!) 91/50 (!) 97/55  Pulse: 80 91  Resp: 10 14  Temp:  36.6 C    Last Pain:  Vitals:   05/10/16 0608  TempSrc: Oral  PainSc:                  Graciella Arment DANIEL

## 2016-05-10 NOTE — Interval H&P Note (Signed)
History and Physical Interval Note:  Pain is no worse.  She understands the risks and benefits.  Offered to have an interpreter present which she and her husband declined.    Plan on laparoscopic appendectomy, and did explain the possibility of an open procedure.   05/10/2016 7:42 AM  Amber Travis  has presented today for surgery, with the diagnosis of appendicitis  The various methods of treatment have been discussed with the patient and family. After consideration of risks, benefits and other options for treatment, the patient has consented to  Procedure(s): APPENDECTOMY LAPAROSCOPIC (N/A) as a surgical intervention .  The patient's history has been reviewed, patient examined, no change in status, stable for surgery.  I have reviewed the patient's chart and labs.  Questions were answered to the patient's satisfaction.     Braedon Sjogren

## 2016-05-10 NOTE — Discharge Summary (Signed)
Physician Discharge Summary  Patient ID: Amber SawyersMarisol E Garcia MRN: [id number] DOB/AGE: 1981/07/01 35 y.o.  Admit date: 05/09/2016 Discharge date: 05/10/2016  Admission Diagnoses:  Discharge Diagnoses:  Active Problems:   Appendicitis   Discharged Condition: good  Hospital Course: Admitted overnight, had laparoscopic appendectomy today, ready for dischrge today.  No perforation.  Consults: None  Significant Diagnostic Studies: radiology: CT scan: abdomen and pelvis  Treatments: IV hydration, antibiotics: ceftriaxone and metronidazole and surgery: Lap Appy  Discharge Exam: Blood pressure (!) 91/47, pulse 82, temperature 98.3 F (36.8 C), temperature source Oral, resp. rate 14, height 5' (1.524 m), weight 79 kg (174 lb 2.6 oz), last menstrual period 04/22/2016, SpO2 97 %, currently breastfeeding. General appearance: alert, cooperative and no distress Resp: clear to auscultation bilaterally GI: soft, non-tender; bowel sounds normal; no masses,  no organomegaly  Disposition: 01-Home or Self Care  Discharge Instructions    Call MD for:  difficulty breathing, headache or visual disturbances    Complete by:  As directed   Call MD for:  extreme fatigue    Complete by:  As directed   Call MD for:  hives    Complete by:  As directed   Call MD for:  persistant dizziness or light-headedness    Complete by:  As directed   Call MD for:  persistant nausea and vomiting    Complete by:  As directed   Call MD for:  redness, tenderness, or signs of infection (pain, swelling, redness, odor or green/yellow discharge around incision site)    Complete by:  As directed   Call MD for:  severe uncontrolled pain    Complete by:  As directed   Call MD for:  temperature >100.4    Complete by:  As directed   Diet general    Complete by:  As directed   Driving Restrictions    Complete by:  As directed   One week   Increase activity slowly    Complete by:  As directed   Leave dressing on - Keep it clean, dry,  and intact until clinic visit    Complete by:  As directed   Lifting restrictions    Complete by:  As directed   No greater than 20 pounds for the next 3 weeks   Sexual Activity Restrictions    Complete by:  As directed   2 weeks       Medication List    TAKE these medications   HYDROcodone-acetaminophen 5-325 MG tablet Commonly known as:  NORCO/VICODIN Take 1-2 tablets by mouth every 6 (six) hours as needed for moderate pain.      Follow-up Information    [facility]. Schedule an appointment as soon as possible for a visit today.   Contact information: [address] [place] [phone]       [facility] [place] Follow up in 2 week(s).   Specialty:  General Surgery Why:  DOW clinic for follow up Contact information: [address] [place] [id number] [phone]          Signed: Jimmye NormanJAMES Roselle Norton 05/10/2016, 4:12 PM

## 2016-05-10 NOTE — Anesthesia Preprocedure Evaluation (Addendum)
Anesthesia Evaluation    Reviewed: Allergy & Precautions, H&P , Patient's Chart, lab work & pertinent test results  History of Anesthesia Complications Negative for: history of anesthetic complications  Airway Mallampati: II  TM Distance: >3 FB Neck ROM: full    Dental no notable dental hx.    Pulmonary neg pulmonary ROS,    Pulmonary exam normal breath sounds clear to auscultation       Cardiovascular negative cardio ROS Normal cardiovascular exam     Neuro/Psych  Headaches, PSYCHIATRIC DISORDERS Depression negative neurological ROS     GI/Hepatic negative GI ROS, Neg liver ROS,   Endo/Other  negative endocrine ROS  Renal/GU negative Renal ROS     Musculoskeletal   Abdominal   Peds  Hematology negative hematology ROS (+)   Anesthesia Other Findings   Reproductive/Obstetrics negative OB ROS                            Anesthesia Physical  Anesthesia Plan  ASA: II  Anesthesia Plan: General   Post-op Pain Management:    Induction: Intravenous and Rapid sequence  Airway Management Planned: Oral ETT  Additional Equipment:   Intra-op Plan:   Post-operative Plan: Extubation in OR  Informed Consent:   Plan Discussed with:   Anesthesia Plan Comments:         Anesthesia Quick Evaluation

## 2016-05-10 NOTE — H&P (Signed)
Amber Travis is an 35 y.o. female.   Chief Complaint: RLQ abdominal pain  HPI: This is a 35 year old female who presents with a less than 24-hour history of right lower quadrant abdominal pain. The pain started as a generalized pain yesterday and is now described as sharp and in the right lower quadrant. She also has nausea and vomiting. She has no previous history of similar abdominal pain. She is otherwise without complaints.  Past Medical History:  Diagnosis Date  . No pertinent past medical history     History reviewed. No pertinent surgical history.  Family History  Problem Relation Age of Onset  . Diabetes Paternal Aunt    Social History:  reports that she has never smoked. She has never used smokeless tobacco. She reports that she does not drink alcohol or use drugs.  Allergies: No Known Allergies   (Not in a hospital admission)  Results for orders placed or performed during the hospital encounter of 05/09/16 (from the past 48 hour(s))  Lipase, blood     Status: None   Collection Time: 05/09/16  8:17 PM  Result Value Ref Range   Lipase 25 11 - 51 U/L  Comprehensive metabolic panel     Status: Abnormal   Collection Time: 05/09/16  8:17 PM  Result Value Ref Range   Sodium 136 135 - 145 mmol/L   Potassium 3.2 (L) 3.5 - 5.1 mmol/L   Chloride 101 101 - 111 mmol/L   CO2 26 22 - 32 mmol/L   Glucose, Bld 129 (H) 65 - 99 mg/dL   BUN 13 6 - 20 mg/dL   Creatinine, Ser 0.69 0.44 - 1.00 mg/dL   Calcium 9.5 8.9 - 10.3 mg/dL   Total Protein 7.3 6.5 - 8.1 g/dL   Albumin 4.3 3.5 - 5.0 g/dL   AST 22 15 - 41 U/L   ALT 17 14 - 54 U/L   Alkaline Phosphatase 56 38 - 126 U/L   Total Bilirubin 1.2 0.3 - 1.2 mg/dL   GFR calc non Af Amer >60 >60 mL/min   GFR calc Af Amer >60 >60 mL/min    Comment: (NOTE) The eGFR has been calculated using the CKD EPI equation. This calculation has not been validated in all clinical situations. eGFR's persistently <60 mL/min signify possible Chronic  Kidney Disease.    Anion gap 9 5 - 15  CBC     Status: Abnormal   Collection Time: 05/09/16  8:17 PM  Result Value Ref Range   WBC 17.1 (H) 4.0 - 10.5 K/uL   RBC 4.48 3.87 - 5.11 MIL/uL   Hemoglobin 14.0 12.0 - 15.0 g/dL   HCT 40.6 36.0 - 46.0 %   MCV 90.6 78.0 - 100.0 fL   MCH 31.3 26.0 - 34.0 pg   MCHC 34.5 30.0 - 36.0 g/dL   RDW 12.3 11.5 - 15.5 %   Platelets 241 150 - 400 K/uL  Urinalysis, Routine w reflex microscopic     Status: Abnormal   Collection Time: 05/09/16  8:22 PM  Result Value Ref Range   Color, Urine YELLOW YELLOW   APPearance CLOUDY (A) CLEAR   Specific Gravity, Urine 1.024 1.005 - 1.030   pH 8.5 (H) 5.0 - 8.0   Glucose, UA NEGATIVE NEGATIVE mg/dL   Hgb urine dipstick TRACE (A) NEGATIVE   Bilirubin Urine NEGATIVE NEGATIVE   Ketones, ur 15 (A) NEGATIVE mg/dL   Protein, ur NEGATIVE NEGATIVE mg/dL   Nitrite NEGATIVE NEGATIVE   Leukocytes,  UA NEGATIVE NEGATIVE  Urine microscopic-add on     Status: Abnormal   Collection Time: 05/09/16  8:22 PM  Result Value Ref Range   Squamous Epithelial / LPF 0-5 (A) NONE SEEN   WBC, UA NONE SEEN 0 - 5 WBC/hpf   RBC / HPF 0-5 0 - 5 RBC/hpf   Bacteria, UA RARE (A) NONE SEEN  POC urine preg, ED     Status: None   Collection Time: 05/09/16  8:28 PM  Result Value Ref Range   Preg Test, Ur NEGATIVE NEGATIVE    Comment:        THE SENSITIVITY OF THIS METHODOLOGY IS >24 mIU/mL    Ct Abdomen Pelvis W Contrast  Result Date: 05/10/2016 CLINICAL DATA:  35 year old female with abdominal pain and vomiting EXAM: CT ABDOMEN AND PELVIS WITH CONTRAST TECHNIQUE: Multidetector CT imaging of the abdomen and pelvis was performed using the standard protocol following bolus administration of intravenous contrast. CONTRAST:  1 ISOVUE-300 IOPAMIDOL (ISOVUE-300) INJECTION 61% COMPARISON:  None. FINDINGS: Minimal bibasilar dependent atelectatic changes of the lung bases. No intra-abdominal free air. Trace free fluid may be present within the  pelvis. The liver, gallbladder, pancreas, spleen, adrenal glands, kidneys, visualized ureters, and urinary bladder appear unremarkable. Subcentimeter left renal upper pole hypodensity is too small to characterize. The uterus is anteverted and grossly unremarkable. Bilateral ovarian follicles noted. There is no evidence of bowel obstruction. There is inflammatory changes and enlargement of the mid and distal portion of the appendix compatible with acute appendicitis. The appendix is located medial to the cecum and extends into the right hemipelvis. No drainable fluid collection or abscess or evidence of perforation. The aorta and IVC appear unremarkable. No portal venous gas identified. There is no adenopathy. There is a small fat containing umbilical hernia. The abdominal wall soft tissues are otherwise unremarkable. A 1.0 x 1.3 cm intramuscular low attenuating lesion anterior to the right femoral head is not well evaluated but may represent a complex cystic structures. An intramural hematoma is less likely. The osseous structures are intact. IMPRESSION: Acute appendicitis.  No abscess or perforation. Electronically Signed   By: Anner Crete M.D.   On: 05/10/2016 00:21    Review of Systems  All other systems reviewed and are negative.   Blood pressure 110/71, pulse 102, temperature 98 F (36.7 C), temperature source Oral, resp. rate 20, height 4' 11"  (1.499 m), weight 74.8 kg (165 lb), last menstrual period 04/22/2016, SpO2 97 %, currently breastfeeding. Physical Exam  Constitutional: She is oriented to person, place, and time. She appears well-developed and well-nourished. No distress.  HENT:  Head: Normocephalic and atraumatic.  Right Ear: External ear normal.  Left Ear: External ear normal.  Nose: Nose normal.  Mouth/Throat: No oropharyngeal exudate.  Eyes: Pupils are equal, round, and reactive to light. Right eye exhibits no discharge. Left eye exhibits no discharge. No scleral icterus.   Neck: Normal range of motion. No tracheal deviation present.  Cardiovascular: Normal rate, regular rhythm, normal heart sounds and intact distal pulses.   No murmur heard. Respiratory: Effort normal and breath sounds normal. No respiratory distress. She has no wheezes.  GI: Soft. There is tenderness. There is guarding.  There is moderate right lower quadrant abdominal tenderness with guarding  Musculoskeletal: Normal range of motion. She exhibits no edema or tenderness.  Lymphadenopathy:    She has no cervical adenopathy.  Neurological: She is alert and oriented to person, place, and time.  Skin: Skin is warm. No  rash noted. She is not diaphoretic. No erythema.  Psychiatric: Her behavior is normal. Judgment normal.     Assessment/Plan Acute appendicitis  Recommendation is admission to the hospital with appendectomy later today. I discussed this with her. IV anabiotic's will be started. She will remain nothing by mouth. She is in agreement with admission  [facility], MD 05/10/2016, 12:51 AM

## 2016-05-10 NOTE — Care Management Note (Signed)
Case Management Note  Patient Details  Name: Amber Travis MRN: [id number] Date of Birth: Oct 05, 1980  Subjective/Objective:                    Action/Plan: [facility] , she [facility] at discharge .  Expected Discharge Date:                  Expected Discharge Plan:  Home/Self Care  In-House Referral:     Discharge planning Services  CM Consult, Medication Assistance, Indigent Health Clinic  Post Acute Care Choice:    Choice offered to:     DME Arranged:    DME Agency:     HH Arranged:    HH Agency:     Status of Service:  Completed, signed off  If discussed at [employer] Length of Stay Meetings, dates discussed:    Additional Comments:  Kingsley PlanWile, Jeovani Weisenburger Marie, RN 05/10/2016, 2:58 PM

## 2016-05-10 NOTE — Transfer of Care (Signed)
Immediate Anesthesia Transfer of Care Note  Patient: Amber Travis  Procedure(s) Performed: Procedure(s): APPENDECTOMY LAPAROSCOPIC (N/A)  Patient Location: PACU  Anesthesia Type:General  Level of Consciousness: awake and alert   Airway & Oxygen Therapy: Patient Spontanous Breathing  Post-op Assessment: Report given to RN, Post -op Vital signs reviewed and stable and Patient moving all extremities X 4  Post vital signs: Reviewed and stable  Last Vitals:  Vitals:   05/10/16 0630 05/10/16 0911  BP: (!) 98/52 (!) (P) 93/44  Pulse:  (P) 90  Resp:  (!) (P) 8  Temp:  (P) 36.6 C    Last Pain:  Vitals:   05/10/16 0608  TempSrc: Oral  PainSc:          Complications: No apparent anesthesia complications

## 2016-05-11 ENCOUNTER — Encounter (HOSPITAL_COMMUNITY): Payer: Self-pay | Admitting: General Surgery

## 2016-05-11 MED FILL — HYDROCODON-APAP 5-325: 5-325 | 4 days supply | Qty: 30 | Fill #0

## 2016-07-28 ENCOUNTER — Ambulatory Visit: Payer: No Typology Code available for payment source | Attending: Internal Medicine

## 2016-08-06 ENCOUNTER — Encounter: Payer: Self-pay | Admitting: Family Medicine

## 2016-08-06 ENCOUNTER — Ambulatory Visit: Payer: Self-pay | Attending: Family Medicine | Admitting: Family Medicine

## 2016-08-06 VITALS — BP 117/77 | HR 66 | Temp 98.6°F | Resp 20 | Wt 172.0 lb

## 2016-08-06 DIAGNOSIS — Z0000 Encounter for general adult medical examination without abnormal findings: Secondary | ICD-10-CM

## 2016-08-06 DIAGNOSIS — R300 Dysuria: Secondary | ICD-10-CM | POA: Insufficient documentation

## 2016-08-06 DIAGNOSIS — N920 Excessive and frequent menstruation with regular cycle: Secondary | ICD-10-CM | POA: Insufficient documentation

## 2016-08-06 DIAGNOSIS — R102 Pelvic and perineal pain: Secondary | ICD-10-CM | POA: Insufficient documentation

## 2016-08-06 DIAGNOSIS — Z113 Encounter for screening for infections with a predominantly sexual mode of transmission: Secondary | ICD-10-CM | POA: Insufficient documentation

## 2016-08-06 DIAGNOSIS — Z23 Encounter for immunization: Secondary | ICD-10-CM | POA: Insufficient documentation

## 2016-08-06 DIAGNOSIS — N760 Acute vaginitis: Secondary | ICD-10-CM | POA: Insufficient documentation

## 2016-08-06 DIAGNOSIS — Z114 Encounter for screening for human immunodeficiency virus [HIV]: Secondary | ICD-10-CM | POA: Insufficient documentation

## 2016-08-06 LAB — POCT URINALYSIS DIPSTICK
BILIRUBIN UA: NEGATIVE
GLUCOSE UA: NEGATIVE
Ketones, UA: NEGATIVE
Leukocytes, UA: NEGATIVE
Nitrite, UA: NEGATIVE
PH UA: 5.5
Protein, UA: NEGATIVE
SPEC GRAV UA: 1.015
Urobilinogen, UA: 0.2

## 2016-08-06 LAB — POCT URINE PREGNANCY: Preg Test, Ur: NEGATIVE

## 2016-08-06 MED ORDER — IBUPROFEN 800 MG PO TABS: 800.0000 mg | ORAL_TABLET | Freq: Three times a day (TID) | ORAL | 0 refills | Status: DC | PRN

## 2016-08-06 MED ORDER — LIDOCAINE 5 % EX OINT: 1.0000 "application " | TOPICAL_OINTMENT | CUTANEOUS | 0 refills | Status: DC | PRN

## 2016-08-06 NOTE — Progress Notes (Signed)
Subjective:  Patient ID: Amber Travis, female    DOB: Oct 02, 1980  Age: 35 y.o. MRN: [id number]  CC: Vaginitis   HPI Amber Travis presents for c/o vaginal burning and pain. She reports symptoms started 3 weeks ago and have persisted. She denies any skin lesions. She denies any vaginal discharge or malodor. Her day of LPM was 10/18. She reports having 1 sexual partner within the last 3 months. Sexual intercourse was unprotected. She also reports heavy menstrual cycles for the last two months. She reported taking oral contraceptives in the past but stopped due to migraines.   Outpatient Medications Prior to Visit  Medication Sig Dispense Refill  . HYDROcodone-acetaminophen (NORCO/VICODIN) 5-325 MG tablet Take 1-2 tablets by mouth every 6 (six) hours as needed for moderate pain. (Patient not taking: Reported on 08/06/2016) 30 tablet 0   No facility-administered medications prior to visit.     ROS Review of Systems  Respiratory: Negative.   Cardiovascular: Negative.   Genitourinary: Positive for dysuria, menstrual problem, pelvic pain and vaginal pain. Negative for difficulty urinating, genital sores and vaginal discharge.  Skin: Negative.     Objective:  BP 117/77   Pulse 66   Temp 98.6 F (37 C) (Oral)   Resp 20   Wt 172 lb (78 kg)   LMP 07/24/2016   SpO2 99%   BMI 33.59 kg/m   BP/Weight 08/06/2016 05/10/2016 06/06/2015  Systolic BP 117 91 112  Diastolic BP 77 47 75  Wt. (Lbs) 172 174.16 156  BMI 33.59 34.01 31.49    Physical Exam  Constitutional: She appears well-developed and well-nourished.  Cardiovascular: Normal rate, regular rhythm and normal heart sounds.   Pulmonary/Chest: Effort normal and breath sounds normal.  Abdominal: Soft. Bowel sounds are normal. She exhibits mass. There is tenderness. There is no guarding.  Genitourinary: There is tenderness in the vagina. No bleeding in the vagina. No vaginal discharge found.  Skin: No lesion (no vaginal lesions  present.) noted.    Assessment & Plan:   Problem List Items Addressed This Visit      Other   DYSURIA - Primary   Relevant Orders   Urinalysis Dipstick (Completed)   POCT urine pregnancy (Completed)   Cervicovaginal ancillary only    Other Visit Diagnoses    Vaginal pain       Relevant Medications   lidocaine (XYLOCAINE) 5 % ointment   ibuprofen (ADVIL,MOTRIN) 800 MG tablet   Other Relevant Orders   Cervicovaginal ancillary only   Screening for STD (sexually transmitted disease)       Relevant Orders   HIV antibody (with reflex)   RPR   HSV(herpes simplex vrs) 1+2 ab-IgG   Cervicovaginal ancillary only   Encounter for immunization       Relevant Medications   lidocaine (XYLOCAINE) 5 % ointment   ibuprofen (ADVIL,MOTRIN) 800 MG tablet   Other Relevant Orders   Urinalysis Dipstick (Completed)   POCT urine pregnancy (Completed)   HIV antibody (with reflex)   RPR   HSV(herpes simplex vrs) 1+2 ab-IgG   Cervicovaginal ancillary only   Flu Vaccine QUAD 36+ mos IM (Completed)   Healthcare maintenance       Relevant Orders   Flu Vaccine QUAD 36+ mos IM (Completed)     Meds ordered this encounter  Medications  . lidocaine (XYLOCAINE) 5 % ointment    Sig: Apply 1 application topically as needed.    Dispense:  35.44 g    Refill:  0  Order Specific Question:   Supervising Provider    Answer:   Quentin AngstJEGEDE, OLUGBEMIGA E [id number]]  . ibuprofen (ADVIL,MOTRIN) 800 MG tablet    Sig: Take 1 tablet (800 mg total) by mouth every 8 (eight) hours as needed.    Dispense:  21 tablet    Refill:  0    Order Specific Question:   Supervising Provider    Answer:   Quentin AngstJEGEDE, OLUGBEMIGA E [id number]    Follow-up: Return if symptoms worsen or fail to improve.   Lizbeth BarkMandesia R Layton Tappan FNP

## 2016-08-06 NOTE — Patient Instructions (Signed)
Vaginitis (Vaginitis) La vaginitis es la inflamacin de la vagina. Se produce cuando las bacterias y los hongos que normalmente se encuentran en la vagina se desarrollan demasiado. Hay diferentes tipos. El tratamiento depender del tipo de infeccin. CUIDADOS EN EL HOGAR  Tome los medicamentos como le indic el mdico.  Mantenga la zona vaginal limpia y seca. Evite el jabn. Enjuague el rea con agua.  Evite las irrigaciones y el lavado brusco (duchas vaginales).  No utilice tampones ni tenga sexo (relaciones sexuales) hasta despus del tratamiento.  Higiencese de adelante hacia atrs despus de ir al bao.  Use ropa interior de algodn.  Evite el uso de ropa interior cuando duerme hasta que la vaginitis haya mejorado.  Evite los pantalones apretados. Evite la ropa interior o medias de nylon que no tengan un panel de algodn.  Qutese la ropa hmeda (por ejemplo el traje de bao) tan pronto como sea posible.  Utilice productos suaves sin perfume. Evite los suavizantes y perfumes para telas. ? Los aerosoles ntimos. ? Los detergentes para la ropa. ? Los tampones. ? Jabones o baos de espuma.  Practique el sexo seguro y use condones.  SOLICITE AYUDA DE INMEDIATO SI:  Siente dolor en el vientre (abdominal).  Tiene fiebre o sntomas que persisten durante ms de 2 o 3 das.  Tiene fiebre y los sntomas empeoran repentinamente.  ASEGRESE DE QUE:  Comprende estas instrucciones.  Controlar la enfermedad.  Solicitar ayuda de inmediato si no mejora o si empeora.  Esta informacin no tiene como fin reemplazar el consejo del mdico. Asegrese de hacerle al mdico cualquier pregunta que tenga. Document Released: 05/17/2012 Document Revised: 05/17/2012 Document Reviewed: 02/03/2012 Elsevier Interactive Patient Education  2017 Elsevier Inc.  

## 2016-08-06 NOTE — Progress Notes (Signed)
C/o vaginal pain x 3 weeks

## 2016-08-07 LAB — HIV ANTIBODY (ROUTINE TESTING W REFLEX): HIV 1&2 Ab, 4th Generation: NONREACTIVE

## 2016-08-07 LAB — RPR

## 2016-08-09 LAB — CERVICOVAGINAL ANCILLARY ONLY
CHLAMYDIA, DNA PROBE: NEGATIVE
NEISSERIA GONORRHEA: NEGATIVE

## 2016-08-09 LAB — HSV(HERPES SIMPLEX VRS) I + II AB-IGG: HSV 1 GLYCOPROTEIN G AB, IGG: 30.9 {index} — AB (ref <0.90)

## 2016-08-10 ENCOUNTER — Telehealth: Payer: Self-pay | Admitting: Family Medicine

## 2016-08-10 ENCOUNTER — Encounter: Payer: Self-pay | Admitting: Family Medicine

## 2016-08-10 ENCOUNTER — Other Ambulatory Visit: Payer: Self-pay | Admitting: Family Medicine

## 2016-08-10 ENCOUNTER — Telehealth: Payer: Self-pay

## 2016-08-10 DIAGNOSIS — B009 Herpesviral infection, unspecified: Secondary | ICD-10-CM | POA: Insufficient documentation

## 2016-08-10 DIAGNOSIS — R102 Pelvic and perineal pain: Secondary | ICD-10-CM

## 2016-08-10 LAB — CERVICOVAGINAL ANCILLARY ONLY: Wet Prep (BD Affirm): NEGATIVE

## 2016-08-10 MED ORDER — IBUPROFEN 800 MG PO TABS: 800.0000 mg | ORAL_TABLET | Freq: Three times a day (TID) | ORAL | 0 refills | Status: DC | PRN

## 2016-08-10 MED ORDER — LIDOCAINE 5 % EX OINT: 1.0000 "application " | TOPICAL_OINTMENT | CUTANEOUS | 0 refills | Status: DC | PRN

## 2016-08-10 NOTE — Telephone Encounter (Signed)
Hello Amber Travis,  I just seen your message. I seen Amber Travis on 08/06/16 and prescribed her medications during her office visit. I sent them to the preferred pharmacy she had on file at the time which [facility] [address] However I have looked on her chart and seen that it [facility] so I will re-order her medication to be sent over to our pharmacy. I will address her concern about labs tomorrow. She is spanish speaking so I will need an interpreter available. Thanks.

## 2016-08-10 NOTE — Telephone Encounter (Addendum)
Call placed to [employer] and call translation assistance received from ID [id number].  Patient fully hipaa verif.    Rn advised patient per Amber BarkMandesia R Hairston, FNP: -HIV, syphilis, chlamydia, gonorrhea, and herpes simplex 2 are all negative. -Herpes simplex 2 which causes genital infection is negative. 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  Avoid sharing drinks or utensils, kissing, or oral sex when you have a cold sore. -Urinalysis is negative. No bacteria present.  -Follow up as needed.

## 2016-08-10 NOTE — Telephone Encounter (Signed)
Patient was given results of labs, patient has multiple concerns and questions regarding results.   Patient was not prescribed medication and she is in constant pain and discomfort.   Please follow up with patient

## 2016-08-11 ENCOUNTER — Other Ambulatory Visit (HOSPITAL_COMMUNITY): Payer: Self-pay | Admitting: *Deleted

## 2016-08-11 ENCOUNTER — Other Ambulatory Visit: Payer: Self-pay | Admitting: Family Medicine

## 2016-08-11 DIAGNOSIS — R102 Pelvic and perineal pain: Secondary | ICD-10-CM

## 2016-08-11 DIAGNOSIS — N63 Unspecified lump in unspecified breast: Secondary | ICD-10-CM

## 2016-08-11 DIAGNOSIS — N644 Mastodynia: Secondary | ICD-10-CM

## 2016-08-11 MED FILL — LIDOCAINE 5% OINTMENT: 5 | 20 days supply | Qty: 30 | Fill #0

## 2016-08-11 MED FILL — IBUPROFEN 800 MG TABLET: 800 | 7 days supply | Qty: 21 | Fill #0

## 2016-08-19 ENCOUNTER — Telehealth: Payer: Self-pay

## 2016-08-19 NOTE — Telephone Encounter (Addendum)
Patient fully hipaa verif;  RN advised patient per Lizbeth BarkMandesia R Hairston, FNP  ---- -Test was negative for yeast, trichomonas, and bacterial vaginosis.  Patient verbalized understanding.  Interpretation assistance by rep viviane id [phone] w/ [employer]

## 2016-08-19 NOTE — Telephone Encounter (Deleted)
-----   Message from Lizbeth BarkMandesia R Hairston, FNP sent at 08/10/2016  6:55 PM EST ----- -Test was negative for yeast, trichomonas, and bacterial vaginosis.

## 2016-09-03 ENCOUNTER — Ambulatory Visit
Admission: RE | Admit: 2016-09-03 | Discharge: 2016-09-03 | Disposition: A | Discharge status: Home / Self Care | Payer: No Typology Code available for payment source | Source: Ambulatory Visit | Attending: Obstetrics and Gynecology | Admitting: Obstetrics and Gynecology

## 2016-09-03 ENCOUNTER — Encounter (HOSPITAL_COMMUNITY): Payer: Self-pay

## 2016-09-03 ENCOUNTER — Ambulatory Visit (HOSPITAL_COMMUNITY)
Admission: RE | Admit: 2016-09-03 | Discharge: 2016-09-03 | Disposition: A | Discharge status: Home / Self Care | Payer: Self-pay | Source: Ambulatory Visit | Attending: Obstetrics and Gynecology | Admitting: Obstetrics and Gynecology

## 2016-09-03 VITALS — BP 102/70 | Temp 97.5°F | Ht 60.0 in | Wt 170.0 lb

## 2016-09-03 DIAGNOSIS — N63 Unspecified lump in unspecified breast: Secondary | ICD-10-CM

## 2016-09-03 DIAGNOSIS — N644 Mastodynia: Secondary | ICD-10-CM

## 2016-09-03 DIAGNOSIS — Z1239 Encounter for other screening for malignant neoplasm of breast: Secondary | ICD-10-CM

## 2016-09-03 DIAGNOSIS — N6321 Unspecified lump in the left breast, upper outer quadrant: Secondary | ICD-10-CM

## 2016-09-03 NOTE — Patient Instructions (Signed)
Explained breast self awareness to Amber SawyersMarisol E Travis. Patient did not need a Pap smear today due to last Pap smear and HPV typing on 12/04/2014. Let her know BCCCP will cover Pap smears and HPV typing every 5 years unless has a history of abnormal Pap smears. [facility] for a bilateral diagnostic mammogram and possible breast ultrasounds. Appointment scheduled for Friday, September 03, 2016 at 1440. Patient aware of appointment and will be there. Amber Travis verbalized understanding.  Sly Parlee, Kathaleen Maserhristine Poll, RN 3:01 PM

## 2016-09-03 NOTE — Progress Notes (Signed)
Complaints of bilateral outer breast pain that is greater in the left breast. Patient stated the left outer breast pain has been going on for 6-7 years and the right outer breast for 2-3 weeks. Patient stated it is a constant pain that she rated at a 6 out of 10. Patient complained of a left axillary lump x 2 weeks and a left breast lump x 6-7 years.   Pap Smear: Pap smear not completed today. [facility] and normal with negative HPV. Per patient has no history of an abnormal Pap smear. Last two Pap smear results are in EPIC.  Physical exam: Breasts Breasts symmetrical. No skin abnormalities bilateral breasts. No nipple retraction bilateral breasts. No nipple discharge bilateral breasts. No lymphadenopathy. No lumps palpated within the right breast. Palpated a lump within the left breast at 2 o'clock 7 cm from the nipple. Complaints of bilateral outer breast pain on exam. [facility] a bilateral diagnostic mammogram and possible breast ultrasounds. Appointment scheduled for Friday, September 03, 2016 at 1440.        Pelvic/Bimanual No Pap smear completed today since last Pap smear and HPV typing was 12/04/2014. Pap smear not indicated per BCCCP guidelines.   Smoking History: Patient has never smoked.  Patient Navigation: Patient education provided. [facility] to services [facility]. Spanish interpreter provided.  Used Spanish interpreter Leana RoeMarley Adams from [employer].

## 2016-09-10 ENCOUNTER — Encounter (HOSPITAL_COMMUNITY): Payer: Self-pay | Admitting: *Deleted

## 2016-09-28 ENCOUNTER — Encounter: Payer: Self-pay | Admitting: Obstetrics and Gynecology

## 2016-10-08 ENCOUNTER — Ambulatory Visit: Payer: Self-pay | Attending: Family Medicine | Admitting: Physician Assistant

## 2016-10-08 ENCOUNTER — Other Ambulatory Visit: Payer: Self-pay | Admitting: Physician Assistant

## 2016-10-08 VITALS — BP 105/64 | HR 78 | Temp 97.2°F | Resp 16 | Wt 171.0 lb

## 2016-10-08 DIAGNOSIS — Z79899 Other long term (current) drug therapy: Secondary | ICD-10-CM | POA: Insufficient documentation

## 2016-10-08 DIAGNOSIS — B360 Pityriasis versicolor: Secondary | ICD-10-CM

## 2016-10-08 MED ORDER — KETOCONAZOLE 2 % EX CREA: 1.0000 "application " | TOPICAL_CREAM | Freq: Two times a day (BID) | CUTANEOUS | 0 refills | Status: DC

## 2016-10-08 MED ORDER — SELENIUM SULFIDE 2.25 % EX SHAM: 1.0000 | MEDICATED_SHAMPOO | CUTANEOUS | 0 refills | Status: AC

## 2016-10-08 MED FILL — KETOCONAZOLE 2% CREAM: 2 | 14 days supply | Qty: 30 | Fill #0

## 2016-10-08 NOTE — Progress Notes (Signed)
   [name], is a 36 y.o. female  ZOX:[id number]  WJX:[id number]  DOB - [birth year]  Subjective:  Chief Complaint and HPI: [name] is a 36 y.o. female here today for 3 week h/o white patches of skin on her R arm.  She has tried OTC creams and it just seems to be worsening.  Minimal itching.  No f/c.  No new soaps or detergents.  [employer] interpreters; "Natalia" used.  ROS:   Constitutional:  No f/c, No night sweats, No unexplained weight loss. EENT:  No vision changes, No blurry vision, No hearing changes. No mouth, throat, or ear problems.  Respiratory: No cough, No SOB Cardiac: No CP, no palpitations GI:  No abd pain, No N/V/D. GU: No Urinary s/sx Musculoskeletal: No joint pain Neuro: No headache, no dizziness, no motor weakness.  Skin: +rash Endocrine:  No polydipsia. No polyuria.  Psych: Denies SI/HI  No problems updated.  ALLERGIES: No Known Allergies  PAST MEDICAL HISTORY: Past Medical History:  Diagnosis Date  . No pertinent past medical history     MEDICATIONS AT HOME: Prior to Admission medications   Medication Sig Start Date End Date Taking? Authorizing Provider  ketoconazole (NIZORAL) 2 % cream Apply 1 application topically 2 (two) times daily. X 2 weeks 10/08/16   Anders SimmondsAngela M McClung, PA-C  Selenium Sulf-Pyrithione-Urea (SELENIUM SULFIDE) 2.25 % SHAM Apply 1 Dose topically 1 day or 1 dose. Wash skin daily X 2 weeks 10/08/16 10/09/16  Anders SimmondsAngela M McClung, PA-C     Objective:  EXAM:   Vitals:   10/08/16 1101  BP: 105/64  Pulse: 78  Resp: 16  Temp: 97.2 F (36.2 C)  TempSrc: Oral  SpO2: 97%  Weight: 171 lb (77.6 kg)    General appearance : A&OX3. NAD. Non-toxic-appearing HEENT: Atraumatic and Normocephalic.  PERRLA. EOM intact.  Neck: supple, no JVD. No cervical lymphadenopathy. No thyromegaly Chest/Lungs:  Breathing-non-labored, Good air entry bilaterally, breath sounds normal without rales, rhonchi, or wheezing  CVS: S1 S2 regular, no murmurs,  gallops, rubs  Extremities: Bilateral Lower Ext shows no edema, both legs are warm to touch with = pulse throughout   Skin:  ~5 3X5cm oval hypopigmented areas present on the L forarm with mild scale  Data Review Lab Results  Component Value Date   HGBA1C 5.0 02/20/2014     Assessment & Plan   1. Tinea versicolor - Selenium Sulf-Pyrithione-Urea (SELENIUM SULFIDE) 2.25 % SHAM; Apply 1 Dose topically 1 day or 1 dose. Wash skin daily X 2 weeks  Dispense: 1 Bottle; Refill: 0 - ketoconazole (NIZORAL) 2 % cream; Apply 1 application topically 2 (two) times daily. X 2 weeks  Dispense: 30 g; Refill: 0   Patient have been counseled extensively about nutrition and exercise  Return if symptoms worsen or fail to improve.  The patient was given clear instructions to go to ER or return to medical center if symptoms don't improve, worsen or new problems develop. The patient verbalized understanding. The patient was told to call to get lab results if they haven't heard anything in the next week.     Georgian CoAngela McClung, PA-C [facility] [place], [place] [phone]   10/08/2016, 12:09 PMPatient ID: Amber Travis, female   DOB: [birth year], 36 y.o.   MRN: [id number]

## 2016-10-08 NOTE — Telephone Encounter (Signed)
Patient is requesting a refill on Nizoral cream

## 2016-10-08 NOTE — Patient Instructions (Signed)
Pitiriasis versicolor (Tinea Versicolor) La pitiriasis versicolor es una infeccin en la piel que est causada por un tipo de levadura (hongo). Produce una erupcin cutnea que se manifiesta como manchas claras u oscuras. Es ms frecuente que aparezca en la piel del pecho, la espalda, el cuello o la parte superior de los brazos. Habitualmente, la afeccin no causa otros problemas. En la mayora de los casos, desaparece en unas pocas semanas con un tratamiento. La infeccin no puede contagiarse de una persona a la otra. CUIDADOS EN EL HOGAR  Tome los medicamentos solamente como se lo haya indicado el mdico.  Frtese la piel todos los das con un champ anticaspa como se lo haya indicado el mdico.  No se rasque la piel en la zona de la erupcin.  Evite los lugares clidos y hmedos.  No use cabinas de bronceado.  Trate de no transpirar demasiado. SOLICITE AYUDA SI:  Los sntomas empeoran.  Tiene fiebre.  Presenta enrojecimiento, hinchazn o dolor en la zona de la erupcin cutnea.  Observa lquido, sangre o pus que salen de la erupcin cutnea.  La erupcin cutnea regresa despus del tratamiento. Esta informacin no tiene como fin reemplazar el consejo del mdico. Asegrese de hacerle al mdico cualquier pregunta que tenga. Document Released: 09/25/2010 Document Revised: 09/13/2014 Document Reviewed: 06/04/2014 Elsevier Interactive Patient Education  2017 Elsevier Inc.  

## 2016-10-11 NOTE — Telephone Encounter (Signed)
Pt. Came into facility stating she came in on 10/08/16 for an appointment and she states that the doctor was going to prescribe her a shampoo and a cream. Pt. Came to the pharmacy and she only received the cream. Please f/u with pt.

## 2016-10-11 NOTE — Telephone Encounter (Signed)
Will forward to angela 

## 2016-10-25 NOTE — Telephone Encounter (Signed)
She can get selsun blue shampoo over the counter and wash her body with it once daily for the next 2 weeks then once a week thereafter to prevent recurrence.

## 2016-10-26 ENCOUNTER — Telehealth: Payer: Self-pay | Admitting: Family Medicine

## 2016-10-26 NOTE — Telephone Encounter (Signed)
CMA return call to patient regarding birth control pills making her depress and headaches & she want tos ee if she can get the DEPO  CMA advice patient to make an office visit to discuss that with her pcp  Patient was aware and understood  CMA transfer to scheduler to make that appt

## 2016-10-26 NOTE — Telephone Encounter (Signed)
Patient called the office to speak with PCP in regard to contraceptives. Pt stated that she can't take the pill because she begins to experience headaches and depression. Pt doesn't want shots or injections either. Pt wants to know what other options are available. Please follow up.  Thank you.

## 2016-10-27 ENCOUNTER — Encounter: Payer: No Typology Code available for payment source | Admitting: Obstetrics and Gynecology

## 2016-11-05 ENCOUNTER — Ambulatory Visit: Payer: No Typology Code available for payment source | Admitting: Family Medicine

## 2016-11-18 ENCOUNTER — Ambulatory Visit: Payer: No Typology Code available for payment source | Admitting: Family Medicine

## 2016-12-08 ENCOUNTER — Ambulatory Visit: Payer: Self-pay | Attending: Family Medicine | Admitting: Physician Assistant

## 2016-12-08 VITALS — BP 111/75 | HR 67 | Temp 97.9°F | Ht 59.0 in | Wt 168.6 lb

## 2016-12-08 DIAGNOSIS — R110 Nausea: Secondary | ICD-10-CM

## 2016-12-08 DIAGNOSIS — Z79899 Other long term (current) drug therapy: Secondary | ICD-10-CM | POA: Insufficient documentation

## 2016-12-08 DIAGNOSIS — R519 Headache, unspecified: Secondary | ICD-10-CM

## 2016-12-08 DIAGNOSIS — R1013 Epigastric pain: Secondary | ICD-10-CM

## 2016-12-08 DIAGNOSIS — R51 Headache: Secondary | ICD-10-CM

## 2016-12-08 LAB — POCT URINALYSIS DIPSTICK
Bilirubin, UA: NEGATIVE
GLUCOSE UA: NEGATIVE
Ketones, UA: NEGATIVE
Nitrite, UA: NEGATIVE
Protein, UA: NEGATIVE
SPEC GRAV UA: 1.005 (ref 1.030–1.035)
UROBILINOGEN UA: 0.2 (ref ?–2.0)
pH, UA: 6 (ref 5.0–8.0)

## 2016-12-08 LAB — POCT URINE PREGNANCY: Preg Test, Ur: NEGATIVE

## 2016-12-08 MED ORDER — METHOCARBAMOL 500 MG PO TABS: 500.0000 mg | ORAL_TABLET | Freq: Four times a day (QID) | ORAL | 0 refills | Status: DC

## 2016-12-08 MED ORDER — NAPROXEN 500 MG PO TABS: 500.0000 mg | ORAL_TABLET | Freq: Two times a day (BID) | ORAL | 0 refills | Status: DC

## 2016-12-08 MED ORDER — ONDANSETRON 8 MG PO TBDP: 8.0000 mg | ORAL_TABLET | Freq: Three times a day (TID) | ORAL | 0 refills | Status: DC | PRN

## 2016-12-08 MED FILL — ONDANSETRON ODT 8 MG TABLET: 8 | 7 days supply | Qty: 20 | Fill #0

## 2016-12-08 MED FILL — METHOCARBAMOL 500 MG TABLET: 500 | 23 days supply | Qty: 90 | Fill #0

## 2016-12-08 MED FILL — NAPROXEN 500 MG TABLET: 500 | 30 days supply | Qty: 60 | Fill #0

## 2016-12-08 NOTE — Patient Instructions (Signed)
To emergency room if pain worsens/fever/or uncontrollable vomiting develop.

## 2016-12-08 NOTE — Progress Notes (Signed)
Amber Travis, is a 36 y.o. female  ZOX:[id number]  WJX:[id number]  DOB - 08/08/1981  Subjective:  Chief Complaint and HPI: Amber Travis is a 36 y.o. female here today for head pain(she specifies that it isn't a typical headache). Alexis through [employer] interpreting. The pain in her head encompasses the entire top of her head and has been occurring for 5-6 days now.  There is no photophobia.  No vomiting.  She has taken ibuprofen with minimal to no relief.  No vision changes.  She also c/o midepigastric pain and nausea without vomiting.  She has had her appendix removed.  This has been occurring the same length of time.  Her appetite is normal.  Her BMs are unchanged/no diarrhea/constipation.    ROS:   Constitutional:  No f/c, No night sweats, No unexplained weight loss. EENT:  No vision changes, No blurry vision, No hearing changes. No mouth, throat, or ear problems.  Respiratory: No cough, No SOB Cardiac: No CP, no palpitations GI:  + abd pain, + N NoV/D. GU: No Urinary s/sx Musculoskeletal: No joint pain Neuro: +head pain but denies true Headache, no dizziness, no motor weakness.  Skin: No rash Endocrine:  No polydipsia. No polyuria.  Psych: Denies SI/HI  No problems updated.  ALLERGIES: No Known Allergies  PAST MEDICAL HISTORY: Past Medical History:  Diagnosis Date  . No pertinent past medical history     MEDICATIONS AT HOME: Prior to Admission medications   Medication Sig Start Date End Date Taking? Authorizing Provider  ketoconazole (NIZORAL) 2 % cream Apply 1 application topically 2 (two) times daily. X 2 weeks Patient not taking: Reported on 12/08/2016 10/08/16   Anders Simmonds, PA-C  methocarbamol (ROBAXIN) 500 MG tablet Take 1 tablet (500 mg total) by mouth 4 (four) times daily. X 5 days then prn pain 12/08/16   Anders Simmonds, PA-C  naproxen (NAPROSYN) 500 MG tablet Take 1 tablet (500 mg total) by mouth 2 (two) times daily with a meal. X 5days then prn pain  12/08/16   Anders Simmonds, PA-C  ondansetron (ZOFRAN-ODT) 8 MG disintegrating tablet Take 1 tablet (8 mg total) by mouth every 8 (eight) hours as needed for nausea or vomiting. 12/08/16   Anders Simmonds, PA-C     Objective:  EXAM:   Vitals:   12/08/16 1505  BP: 111/75  Pulse: 67  Temp: 97.9 F (36.6 C)  TempSrc: Oral  SpO2: 99%  Weight: 168 lb 9.6 oz (76.5 kg)  Height:  (1.499 m)    General appearance : A&OX3. NAD. Non-toxic-appearing HEENT: Atraumatic and Normocephalic.  PERRLA. EOM intact.  TM clear B. Mouth-MMM, post pharynx WNL w/o erythema, No PND.  No abnormality of the scalp appreciated with examination or palpation. Neck: supple, no JVD. No cervical lymphadenopathy. No thyromegaly Chest/Lungs:  Breathing-non-labored, Good air entry bilaterally, breath sounds normal without rales, rhonchi, or wheezing  CVS: S1 S2 regular, no murmurs, gallops, rubs  Abdomen: Bowel sounds present, There is mild TTP in the midepigastric area and the RLQ.  No guarding or rebound.  Neg Murphy's.  Neg McBurney's(and no appendix) Extremities: Bilateral Lower Ext shows no edema, both legs are warm to touch with = pulse throughout Neurology:  CN II-XII grossly intact, Non focal.   Psych:  TP linear. J/I WNL. Normal speech. Appropriate eye contact and affect.  Skin:  No Rash  Data Review Lab Results  Component Value Date   HGBA1C 5.0 02/20/2014     Assessment &  Plan   1. Acute intractable headache, unspecified headache type Unsure etiology-possible viral syndrome causing all of her symptoms- - methocarbamol (ROBAXIN) 500 MG tablet; Take 1 tablet (500 mg total) by mouth 4 (four) times daily. X 5 days then prn pain  Dispense: 90 tablet; Refill: 0 - naproxen (NAPROSYN) 500 MG tablet; Take 1 tablet (500 mg total) by mouth 2 (two) times daily with a meal. X 5days then prn pain  Dispense: 60 tablet; Refill: 0 - TSH - CBC with Differential/Platelet  2. Nausea without vomiting ?viral  etiology - POCT urine pregnancy=negative - ondansetron (ZOFRAN-ODT) 8 MG disintegrating tablet; Take 1 tablet (8 mg total) by mouth every 8 (eight) hours as needed for nausea or vomiting.  Dispense: 20 tablet; Refill: 0 - POCT urinalysis dipstick - CBC with Differential/Platelet - Comprehensive metabolic panel  3. Epigastric pain To ED if worsens - CBC with Differential/Platelet - Comprehensive metabolic panel Patient have been counseled extensively about nutrition and exercise  Return if symptoms worsen or fail to improve.  The patient was given clear instructions to go to ER or return to medical center if symptoms don't improve, worsen or new problems develop. The patient verbalized understanding. The patient was told to call to get lab results if they haven't heard anything in the next week.     Georgian Co, PA-C [facility] [place], [place] [phone]   12/08/2016, 4:47 PMPatient ID: Amber Travis, female   DOB: 1981/03/08, 36 y.o.   MRN: [id number]

## 2016-12-09 ENCOUNTER — Telehealth: Payer: Self-pay

## 2016-12-09 LAB — CBC WITH DIFFERENTIAL/PLATELET
Basophils Absolute: 0 10*3/uL (ref 0.0–0.2)
Basos: 1 %
EOS (ABSOLUTE): 0.2 10*3/uL (ref 0.0–0.4)
Eos: 2 %
Hematocrit: 40 % (ref 34.0–46.6)
Hemoglobin: 14 g/dL (ref 11.1–15.9)
Immature Grans (Abs): 0 10*3/uL (ref 0.0–0.1)
Immature Granulocytes: 0 %
LYMPHS ABS: 2.5 10*3/uL (ref 0.7–3.1)
Lymphs: 32 %
MCH: 31.9 pg (ref 26.6–33.0)
MCHC: 35 g/dL (ref 31.5–35.7)
MCV: 91 fL (ref 79–97)
MONOS ABS: 0.5 10*3/uL (ref 0.1–0.9)
Monocytes: 6 %
NEUTROS ABS: 4.5 10*3/uL (ref 1.4–7.0)
Neutrophils: 59 %
Platelets: 231 10*3/uL (ref 150–379)
RBC: 4.39 x10E6/uL (ref 3.77–5.28)
RDW: 13.5 % (ref 12.3–15.4)
WBC: 7.6 10*3/uL (ref 3.4–10.8)

## 2016-12-09 LAB — COMPREHENSIVE METABOLIC PANEL
ALBUMIN: 4.7 g/dL (ref 3.5–5.5)
ALK PHOS: 68 IU/L (ref 39–117)
ALT: 12 IU/L (ref 0–32)
AST: 17 IU/L (ref 0–40)
Albumin/Globulin Ratio: 1.7 (ref 1.2–2.2)
BILIRUBIN TOTAL: 0.7 mg/dL (ref 0.0–1.2)
BUN / CREAT RATIO: 17 (ref 9–23)
BUN: 13 mg/dL (ref 6–20)
CHLORIDE: 101 mmol/L (ref 96–106)
CO2: 23 mmol/L (ref 18–29)
CREATININE: 0.76 mg/dL (ref 0.57–1.00)
Calcium: 9.8 mg/dL (ref 8.7–10.2)
GFR calc Af Amer: 118 mL/min/{1.73_m2} (ref >59)
GFR calc non Af Amer: 102 mL/min/{1.73_m2} (ref >59)
GLUCOSE: 93 mg/dL (ref 65–99)
Globulin, Total: 2.7 g/dL (ref 1.5–4.5)
Potassium: 4.1 mmol/L (ref 3.5–5.2)
SODIUM: 142 mmol/L (ref 134–144)
Total Protein: 7.4 g/dL (ref 6.0–8.5)

## 2016-12-09 LAB — TSH: TSH: 1.19 u[IU]/mL (ref 0.450–4.500)

## 2016-12-09 NOTE — Telephone Encounter (Signed)
CMA call patient to inform about lab results  Patient Verify DOB  Patient was aware and  understood  

## 2016-12-09 NOTE — Telephone Encounter (Signed)
-----   Message from Anders Simmonds, [place] sent at 12/09/2016  8:17 AM EDT ----- Your labs are all normal including your blood sugar,blood count,  thyroid function, kidney and liver function, and electrolytes.  Follow up as planned.  Thanks, Georgian Co, PA-C

## 2017-01-12 ENCOUNTER — Ambulatory Visit: Payer: Self-pay | Attending: Family Medicine

## 2017-04-05 ENCOUNTER — Ambulatory Visit: Payer: Self-pay | Attending: Family Medicine | Admitting: Family Medicine

## 2017-04-05 ENCOUNTER — Encounter: Payer: Self-pay | Admitting: Family Medicine

## 2017-04-05 VITALS — BP 101/69 | HR 68 | Temp 97.6°F | Resp 18 | Ht 59.0 in | Wt 167.8 lb

## 2017-04-05 DIAGNOSIS — R519 Headache, unspecified: Secondary | ICD-10-CM

## 2017-04-05 DIAGNOSIS — M545 Low back pain, unspecified: Secondary | ICD-10-CM

## 2017-04-05 DIAGNOSIS — N921 Excessive and frequent menstruation with irregular cycle: Secondary | ICD-10-CM

## 2017-04-05 DIAGNOSIS — N3001 Acute cystitis with hematuria: Secondary | ICD-10-CM

## 2017-04-05 DIAGNOSIS — N920 Excessive and frequent menstruation with regular cycle: Secondary | ICD-10-CM | POA: Insufficient documentation

## 2017-04-05 DIAGNOSIS — R102 Pelvic and perineal pain: Secondary | ICD-10-CM

## 2017-04-05 DIAGNOSIS — B351 Tinea unguium: Secondary | ICD-10-CM

## 2017-04-05 DIAGNOSIS — N946 Dysmenorrhea, unspecified: Secondary | ICD-10-CM | POA: Insufficient documentation

## 2017-04-05 DIAGNOSIS — Z131 Encounter for screening for diabetes mellitus: Secondary | ICD-10-CM

## 2017-04-05 DIAGNOSIS — R51 Headache: Secondary | ICD-10-CM

## 2017-04-05 DIAGNOSIS — Z79899 Other long term (current) drug therapy: Secondary | ICD-10-CM | POA: Insufficient documentation

## 2017-04-05 DIAGNOSIS — Z833 Family history of diabetes mellitus: Secondary | ICD-10-CM | POA: Insufficient documentation

## 2017-04-05 LAB — POCT URINALYSIS DIPSTICK
Bilirubin, UA: NEGATIVE
Glucose, UA: NEGATIVE
Ketones, UA: NEGATIVE
Nitrite, UA: NEGATIVE
Protein, UA: NEGATIVE
SPEC GRAV UA: 1.02 (ref 1.010–1.025)
UROBILINOGEN UA: 0.2 U/dL
pH, UA: 5.5 (ref 5.0–8.0)

## 2017-04-05 LAB — POCT URINE PREGNANCY: Preg Test, Ur: NEGATIVE

## 2017-04-05 MED ORDER — NITROFURANTOIN MONOHYD MACRO 100 MG PO CAPS: 100.0000 mg | ORAL_CAPSULE | Freq: Two times a day (BID) | ORAL | 0 refills | Status: DC

## 2017-04-05 MED ORDER — NORGESTIM-ETH ESTRAD TRIPHASIC 0.18/0.215/0.25 MG-35 MCG PO TABS: 1.0000 | ORAL_TABLET | Freq: Every day | ORAL | 11 refills | Status: DC

## 2017-04-05 MED ORDER — NAPROXEN 500 MG PO TABS: ORAL_TABLET | ORAL | 0 refills | Status: DC

## 2017-04-05 MED FILL — TRI-LINYAH TABLET: 0.18/0.215/ | 28 days supply | Qty: 28 | Fill #0

## 2017-04-05 MED FILL — NAPROXEN 500 MG TABLET: 500 | 20 days supply | Qty: 40 | Fill #0

## 2017-04-05 MED FILL — NITROFURANTOIN MONO-MCR 100: 100 | 10 days supply | Qty: 10 | Fill #0

## 2017-04-05 NOTE — Progress Notes (Signed)
Patient is here for irregular period   Patient complains about back pain   Patient is requesting diabetes screening

## 2017-04-05 NOTE — Patient Instructions (Signed)
Uso de los Electronics engineer (Oral Contraception Use) Los anticonceptivos orales (ACO) son medicamentos que se utilizan para Therapist, occupational. Su funcin es [employer] ovarios liberen vulos. Las hormonas de los ACO tambin hacen que el moco cervical se haga ms espeso, lo que evita que el esperma ingrese al tero. Tambin hacen que la membrana que recubre internamente al tero se vuelva ms fina, lo que no permite que el huevo fertilizado se adhiera a la pared del tero. Los ACO son muy efectivos cuando se toman exactamente como se prescriben. Sin embargo, no previenen contra las enfermedades de transmisin sexual (ETS). La prctica del sexo seguro, como el uso de preservativos, junto con los ACO, [place] a prevenir ese tipo de enfermedades. Antes de tomar ACO, debe hacerse un examen fsico y un [place]apan[place]. El mdico podr indicarle anlisis de [place], si es necesario. El mdico se asegurar de que usted sea [place] buena candidata para usar anticonceptivos orales. Converse con su mdico acerca de los posibles efectos secundarios de los ACO que podran recetarle. Cuando se inicia el uso de ACO, se pueden tomar durante 2 a 3 meses para que el cuerpo se adapte a los cambios en los niveles hormonales en el cuerpo. CMO TOMAR LOS ANTICONCEPTIVOS ORALES El mdico le indicar como comenzar a Holiday representative de ACO. De lo contrario usted puede:  Journalist, newspaper de inicio del ciclo menstrual. No necesitar proteccin anticonceptiva adicional al Engineer, manufacturing.  Comenzar Software engineer domingo luego de su perodo menstrual, o Games developer en que adquiere el [employer]. En estos casos deber Aetna proteccin anticonceptiva [employer] primeros 7 das del [place].  Comenzar a tomarlos en cualquier momento del ciclo. Si toma el anticonceptivo dentro de los 5 das de iniciado el perodo, Personnel officer protegida de quedar embarazada inmediatamente. En este caso, no necesitar una forma adicional de  anticonceptivos. Si comienza en cualquier otro momento del ciclo menstrual, necesitar usar otra forma de anticonceptivo durante 7 das. Si sus ACO son del tipo de los [employer], podrn impedir el embarazo despus de tomarlas por 2 das (48 horas). Luego de comenzar a tomar los ACO:  Si olvid de tomar 1 pldora, tmela tan pronto como lo recuerde. Tome la siguiente pldora a la hora habitual.  Si dej de tomar 2 o ms pldoras, comunquese con su mdico ya que diferentes pldoras tienen diferentes instrucciones para las dosis que no se han tomado. Si olvida tomar 2 o ms pldoras, utilice un mtodo anticonceptivo adicional hasta que comience su prximo perodo menstrual.  Si utiliza el envase de 28 pldoras que contienen pldoras inactivas y [place] tomar 1 de las ltimas 7 (pldoras sin hormonas), sto no tiene Education administrator. Simplemente deseche el resto de las pldoras que no contienen hormonas y comience un nuevo envase. No importa cuando comience a tomar los anticonceptivos, siempre empiece un nuevo envase el mismo da de la [place]. Tenga un envase extra de ACO y use un mtodo anticonceptivo adicional para Education officer, museum en que se olvide de tomar algunas pldoras o pierda la caja. INSTRUCCIONES PARA EL CUIDADO EN EL HOGAR  No fume.  Use siempre un condn para protegerse contra las enfermedades de transmisin sexual. Los ACO no protegen contra las enfermedades de transmisin sexual.  Use un almanaque para [employer] de su perodo menstrual.  Lea la informacin y consejos que vienen con las ACO. Hable con el profesional si tiene dudas.  SOLICITE ATENCIN MDICA SI:  Presenta nuseas  o vmitos.  Tiene flujo o sangrado vaginal anormal.  Aparece una erupcin cutnea.  No tiene el perodo menstrual.  Pierde el cabello.  Necesita tratamiento por cambios en su estado de nimo o por depresin.  Se siente mareada al Liberty Mutualtomar los ACO.  Comienza a aparecer acn con el uso de los  ACO.  Ardelle AntonQueda embarazada.  SOLICITE ATENCIN MDICA DE INMEDIATO SI:  Siente dolor en el pecho.  Le falta el aire.  Le duele mucho la cabeza y no puede Human resources officercontrolar el dolor.  Siente adormecimiento o tiene dificultad para hablar.  Tiene problemas de visin.  Presenta dolor, inflamacin o hinchazn en las piernas.  Esta informacin no tiene Theme park managercomo fin reemplazar el consejo del mdico. Asegrese de hacerle al mdico cualquier pregunta que tenga. Document Released: 08/12/2011 Document Revised: 12/15/2015 Document Reviewed: 02/11/2013 Elsevier Interactive Patient Education  2017 Elsevier Inc.   Metrorragia funcional (Dysfunctional Uterine Bleeding) La metrorragia funcional es una hemorragia anormal proveniente del tero. La metrorragia funcional incluye estos sntomas:  Menstruacin que se adelanta o se atrasa.  Menstruacin menos o ms abundante, o con cogulos sanguneos.  Hemorragias entre los perodos Becton, Dickinson and Companymenstruales.  Ausencia de una o ms menstruaciones.  Hemorragias luego de Sales promotion account executivemantener relaciones sexuales.  Sangrado luego de la menopausia. INSTRUCCIONES PARA EL CUIDADO EN EL HOGAR Est atenta a cualquier cambio en los sntomas. Estas indicaciones pueden ayudarla con el trastorno: Comidas  Siga una dieta equilibrada. Incluya alimentos con FedExalto contenido de hierro, como hgado, carne, Oceanographermariscos, verduras de hoja verde y Dalton Gardenshuevos.  Si tiene estreimiento: ? Liz ClaiborneBeba abundante agua. ? Consuma frutas y verduras con alto contenido de agua y [place]fontefibra, [place] espinaca, zanahorias, frambuesas, manzanas y mango. Medicamentos  Baxter Internationalome los medicamentos de venta libre y los recetados solamente como se lo haya indicado el mdico.  No haga cambios en los medicamentos sin hablar con el mdico.  La aspirina o los medicamentos que la contienen pueden aumentar la hemorragia. No tome esos medicamentos: ? Durante la semana previa a Tax adviserla menstruacin. ? Durante la Brink's Companymenstruacin.  Si le recetaron comprimidos  de hierro, Scientist, forensictmelos como se lo haya indicado el mdico. Estos ayudan a Restaurant manager, fast foodreponer el hierro que el organismo pierde debido a este trastorno. Actividad  Si debe cambiarse el apsito o el tampn ms de una vez cada 2horas: ? Acustese con los pies elevados. ? Colquese una compresa fra en la parte baja del abdomen. ? Haga todo el reposo que pueda hasta que la hemorragia se detenga o disminuya.  No trate de Management consultantadelgazar hasta que la hemorragia se detenga y los niveles de hierro en la sangre se normalicen. Otras indicaciones  MetLifeDurante dos meses, anote lo siguiente: ? La fecha de comienzo de Tax adviserla menstruacin. ? La fecha de su finalizacin. ? Los Rite Aidmomentos en los que tiene una hemorragia anormal. ? Los problemas que advierte.  Concurra a todas las visitas de control como se lo haya indicado el mdico. Esto es importante. SOLICITE ATENCIN MDICA SI:  Se siente dbil o que va a desvanecerse.  Tiene nuseas y vmitos.  No puede comer ni beber sin vomitar.  Tiene mareos o diarrea mientras toma los medicamentos.  Est tomando anticonceptivos u hormonas, y desea cambiar o suspender estos medicamentos. SOLICITE ATENCIN MDICA DE INMEDIATO SI:  Tiene escalofros o fiebre.  Debe cambiarse el apsito o el tampn ms de una vez por hora.  La hemorragia se vuelve ms abundante o el flujo menstrual contiene cogulos con ms frecuencia.  Siente dolor en el abdomen.  Pierde la conciencia.  Le aparece una erupcin cutnea. Esta informacin no tiene Theme park managercomo fin reemplazar el consejo del mdico. Asegrese de hacerle al mdico cualquier pregunta que tenga. Document Released: 06/02/2005 Document Revised: 05/14/2015 Document Reviewed: 11/18/2014 Elsevier Interactive Patient Education  [employer]2018 Elsevier Inc.

## 2017-04-05 NOTE — Progress Notes (Signed)
Subjective:  Patient ID: Amber Travis, female    DOB: 1981/07/27  Age: 36 y.o. MRN: [id number]  CC: Establish Care   HPI Amber Travis presents for irregular menses. Patient's last menstrual period was 03/30/2017 and has persisted Periods are irregular, lasting 4 days.She reports 2 month history of menstrual cycle occurring twice per month.  Dysmenorrhea:moderate, occurring throughout menses. Cyclic symptoms include cramping.  Current contraception: none. History of infertility: no. History of abnormal Pap smear: no. Denies any known history of ovarian cyst of fibroids. She is agreeable to oral contraception. Denies any history of migraine, blood clots/ bleeding disorders, or smoking. She does report occasional generalized headaches. She requests screening for diabetes. Mother and maternal aunt has history of diabetes. She c/o thickened nail discoloration for several months.      Outpatient Medications Prior to Visit  Medication Sig Dispense Refill  . ketoconazole (NIZORAL) 2 % cream Apply 1 application topically 2 (two) times daily. X 2 weeks (Patient not taking: Reported on 12/08/2016) 30 g 0  . methocarbamol (ROBAXIN) 500 MG tablet Take 1 tablet (500 mg total) by mouth 4 (four) times daily. X 5 days then prn pain (Patient not taking: Reported on 04/05/2017) 90 tablet 0  . naproxen (NAPROSYN) 500 MG tablet Take 1 tablet (500 mg total) by mouth 2 (two) times daily with a meal. X 5days then prn pain (Patient not taking: Reported on 04/05/2017) 60 tablet 0  . ondansetron (ZOFRAN-ODT) 8 MG disintegrating tablet Take 1 tablet (8 mg total) by mouth every 8 (eight) hours as needed for nausea or vomiting. (Patient not taking: Reported on 04/05/2017) 20 tablet 0   No facility-administered medications prior to visit.     ROS Review of Systems  Constitutional: Negative.   Respiratory: Negative.   Cardiovascular: Negative.   Gastrointestinal: Negative.   Genitourinary: Positive for menstrual  problem, pelvic pain and vaginal bleeding.  Psychiatric/Behavioral: Negative.     Objective:  BP 101/69 (BP Location: Left Arm, Patient Position: Sitting, Cuff Size: Normal)   Pulse 68   Temp 97.6 F (36.4 C) (Oral)   Resp 18   Ht 4\' 11"  (1.499 m)   Wt 167 lb 12.8 oz (76.1 kg)   LMP 03/30/2017   SpO2 99%   BMI 33.89 kg/m   BP/Weight 04/05/2017 12/08/2016 10/08/2016  Systolic BP 101 111 105  Diastolic BP 69 75 64  Wt. (Lbs) 167.8 168.6 171  BMI 33.89 34.05 33.4     Physical Exam  Constitutional: She appears well-developed and well-nourished.  Neck: No JVD present.  Cardiovascular: Normal rate, regular rhythm, normal heart sounds and intact distal pulses.   Pulmonary/Chest: Effort normal and breath sounds normal.  Abdominal: Soft. Bowel sounds are normal. There is tenderness (suprapubic).  Skin: Skin is warm and dry.  Left great toenail, thickened and raised from nail bed.   Psychiatric: Her mood appears anxious. She expresses no homicidal and no suicidal ideation. She expresses no suicidal plans and no homicidal plans.  Nursing note and vitals reviewed.   Assessment & Plan:   Problem List Items Addressed This Visit    None    Visit Diagnoses    Menorrhagia with irregular cycle    -  Primary   Relevant Medications   Norgestimate-Ethinyl Estradiol Triphasic (ORTHO TRI-CYCLEN, 28,) 0.18/0.215/0.25 MG-35 MCG tablet   Other Relevant Orders   POCT urine pregnancy (Completed)   CBC (Completed)   Acute cystitis with hematuria       Relevant Medications  nitrofurantoin, macrocrystal-monohydrate, (MACROBID) 100 MG capsule   Other Relevant Orders   Urinalysis Dipstick (Completed)   Acute midline low back pain without sciatica       Relevant Medications   naproxen (NAPROSYN) 500 MG tablet   Suprapubic abdominal pain       Relevant Orders   Urinalysis Dipstick (Completed)   POCT urine pregnancy (Completed)   Screening for diabetes mellitus       Relevant Orders    Hemoglobin A1c (Completed)   Onychomycosis       Relevant Orders   Ambulatory referral to Podiatry   Acute intractable headache, unspecified headache type       Relevant Medications   naproxen (NAPROSYN) 500 MG tablet      Meds ordered this encounter  Medications  . nitrofurantoin, macrocrystal-monohydrate, (MACROBID) 100 MG capsule    Sig: Take 1 capsule (100 mg total) by mouth 2 (two) times daily.    Dispense:  10 capsule    Refill:  0    Order Specific Question:   Supervising Provider    Answer:   Quentin AngstJEGEDE, OLUGBEMIGA E [id number]  . Norgestimate-Ethinyl Estradiol Triphasic (ORTHO TRI-CYCLEN, 28,) 0.18/0.215/0.25 MG-35 MCG tablet    Sig: Take 1 tablet by mouth daily.    Dispense:  1 Package    Refill:  11    Order Specific Question:   Supervising Provider    Answer:   Quentin AngstJEGEDE, OLUGBEMIGA E [id number]  . naproxen (NAPROSYN) 500 MG tablet    Sig: TAKE ONE TABLET BY MOUTH  TWICE A DAY WITH MEALS FOR 10 DAYS. THEN TAKE ONE TABLET BY MOUTH WITH MEALS DAILY AS NEEDED.    Dispense:  40 tablet    Refill:  0    Order Specific Question:   Supervising Provider    Answer:   Quentin AngstJEGEDE, OLUGBEMIGA E [[id number]]    Follow-up: Return in about 2 months (around 06/05/2017).   Amber BarkMandesia R Hairston FNP

## 2017-04-06 LAB — CBC
HEMOGLOBIN: 13.7 g/dL (ref 11.1–15.9)
Hematocrit: 41.5 % (ref 34.0–46.6)
MCH: 31.2 pg (ref 26.6–33.0)
MCHC: 33 g/dL (ref 31.5–35.7)
MCV: 95 fL (ref 79–97)
Platelets: 238 10*3/uL (ref 150–379)
RBC: 4.39 x10E6/uL (ref 3.77–5.28)
RDW: 13.3 % (ref 12.3–15.4)
WBC: 7 10*3/uL (ref 3.4–10.8)

## 2017-04-06 LAB — HEMOGLOBIN A1C
Est. average glucose Bld gHb Est-mCnc: 100 mg/dL
HEMOGLOBIN A1C: 5.1 % (ref 4.8–5.6)

## 2017-04-13 ENCOUNTER — Telehealth: Payer: Self-pay | Admitting: Family Medicine

## 2017-04-13 NOTE — Telephone Encounter (Signed)
CMA call regarding lab results   Patient verify DOB  Patient was aware and understood  

## 2017-04-13 NOTE — Telephone Encounter (Signed)
Pt called requesting lab results, pt is also asking for advise states she has abd pain while taking birth control medication. Please f/up

## 2017-04-13 NOTE — Telephone Encounter (Signed)
-----   Message from Lizbeth BarkMandesia R Hairston, [place]FNP sent at 04/13/2017  2:14 PM EDT ----- Negative for diabetes Labs that evaluated your blood cells are normal. No signs of anemia, acute infection, or inflammation present.

## 2017-04-13 NOTE — Telephone Encounter (Signed)
Pt called requesting lab results I already went to patient chart have not see any results from pcp yet , pt is also asking for advise states she has abd pain while taking birth control medication. Please advice?

## 2017-04-13 NOTE — Telephone Encounter (Signed)
Labs resulted. This is a common side effect of starting oral contraceptives recommend she try for a few more weeks if she does not have any symptoms improvement options include changing to another oral contraceptive or discontinuing altogether.

## 2017-05-02 MED FILL — TRI-LINYAH TABLET: 0.18/0.215/ | 28 days supply | Qty: 28 | Fill #1

## 2017-06-01 MED FILL — TRI-LINYAH TABLET: 0.18/0.215/ | 28 days supply | Qty: 28 | Fill #2

## 2017-06-06 ENCOUNTER — Encounter: Payer: Self-pay | Admitting: Family Medicine

## 2017-06-06 ENCOUNTER — Ambulatory Visit: Payer: Self-pay | Attending: Family Medicine | Admitting: Family Medicine

## 2017-06-06 VITALS — BP 113/80 | HR 79 | Temp 98.0°F | Resp 18 | Ht 59.0 in | Wt 167.4 lb

## 2017-06-06 DIAGNOSIS — G8929 Other chronic pain: Secondary | ICD-10-CM

## 2017-06-06 DIAGNOSIS — J069 Acute upper respiratory infection, unspecified: Secondary | ICD-10-CM

## 2017-06-06 DIAGNOSIS — B9789 Other viral agents as the cause of diseases classified elsewhere: Secondary | ICD-10-CM

## 2017-06-06 DIAGNOSIS — N949 Unspecified condition associated with female genital organs and menstrual cycle: Secondary | ICD-10-CM

## 2017-06-06 DIAGNOSIS — R102 Pelvic and perineal pain: Secondary | ICD-10-CM

## 2017-06-06 LAB — POCT URINALYSIS DIPSTICK
Bilirubin, UA: NEGATIVE
GLUCOSE UA: NEGATIVE
KETONES UA: NEGATIVE
Nitrite, UA: NEGATIVE
PROTEIN UA: NEGATIVE
SPEC GRAV UA: 1.02 (ref 1.010–1.025)
UROBILINOGEN UA: 0.2 U/dL
pH, UA: 5.5 (ref 5.0–8.0)

## 2017-06-06 MED ORDER — GUAIFENESIN-DM 100-10 MG/5ML PO SYRP: 5.0000 mL | ORAL_SOLUTION | ORAL | 0 refills | Status: DC | PRN

## 2017-06-06 MED ORDER — FLUTICASONE PROPIONATE 50 MCG/ACT NA SUSP: 2.0000 | Freq: Every day | NASAL | 0 refills | Status: DC

## 2017-06-06 MED ORDER — IBUPROFEN 600 MG PO TABS: 600.0000 mg | ORAL_TABLET | Freq: Three times a day (TID) | ORAL | 0 refills | Status: DC | PRN

## 2017-06-06 MED ORDER — LIDOCAINE 5 % EX OINT: TOPICAL_OINTMENT | CUTANEOUS | 1 refills | Status: DC

## 2017-06-06 MED FILL — ROBAFEN-DM SYRUP: 100-10 | 7 days supply | Qty: 236 | Fill #0

## 2017-06-06 MED FILL — FLUTICASONE PROP 50 MCG SPR: 50 | 30 days supply | Qty: 16 | Fill #0

## 2017-06-06 MED FILL — LIDOCAINE 5 % OINT: 5 | 15 days supply | Qty: 35 | Fill #0

## 2017-06-06 MED FILL — IBUPROFEN 600 MG TABLET: 600 | 10 days supply | Qty: 30 | Fill #0

## 2017-06-06 NOTE — Progress Notes (Signed)
Subjective:  Patient ID: Amber Travis, female    DOB: May 13, 1981  Age: 36 y.o. MRN: [id number]  CC: Follow-up   HPI Amber Travis presents for c/o vaginal burning and pain. She reports onset was 5 years ago. Associated symptoms burning inside the vaginal canal. Aggravated by sexual intercourse. She denies any lesions, dysuria, or vaginal  discharge. LPM last week. History of irregular menstrual cycles she reports taking oral contraceptives to help control symptoms. She complains of cough, sore throat, headache, and sneezing. She denies any fevers. Onset of symptoms was 1 week ago, unchanged since that time.She reports recent sick contacts her sister, child, and now husband. She report taking ibuprofen for    Outpatient Medications Prior to Visit  Medication Sig Dispense Refill  . nitrofurantoin, macrocrystal-monohydrate, (MACROBID) 100 MG capsule Take 1 capsule (100 mg total) by mouth 2 (two) times daily. 10 capsule 0  . Norgestimate-Ethinyl Estradiol Triphasic (ORTHO TRI-CYCLEN, 28,) 0.18/0.215/0.25 MG-35 MCG tablet Take 1 tablet by mouth daily. 1 Package 11  . naproxen (NAPROSYN) 500 MG tablet TAKE ONE TABLET BY MOUTH  TWICE A DAY WITH MEALS FOR 10 DAYS. THEN TAKE ONE TABLET BY MOUTH WITH MEALS DAILY AS NEEDED. 40 tablet 0   No facility-administered medications prior to visit.     ROS Review of Systems  Constitutional: Negative.   HENT: Positive for sore throat.   Respiratory: Positive for cough.   Cardiovascular: Negative.   Gastrointestinal: Negative.   Genitourinary: Positive for vaginal pain. Negative for dysuria, genital sores and vaginal discharge.  Skin: Negative.   Neurological: Positive for headaches.    Objective:  BP 113/80 (BP Location: Left Arm, Patient Position: Sitting, Cuff Size: Normal)   Pulse 79   Temp 98 F (36.7 C) (Oral)   Resp 18   Ht  (1.499 m)   Wt 167 lb 6.4 oz (75.9 kg)   SpO2 94%   BMI 33.81 kg/m   BP/Weight 06/06/2017 04/05/2017  12/08/2016  Systolic BP 113 101 111  Diastolic BP 80 69 75  Wt. (Lbs) 167.4 167.8 168.6  BMI 33.81 33.89 34.05    Physical Exam  Constitutional: She appears well-developed and well-nourished.  HENT:  Head: Normocephalic and atraumatic.  Right Ear: External ear normal.  Left Ear: External ear normal.  Nose: Rhinorrhea present.  Mouth/Throat: Oropharynx is clear and moist. No oropharyngeal exudate or posterior oropharyngeal erythema.  Eyes: Pupils are equal, round, and reactive to light. Conjunctivae and EOM are normal.  Cardiovascular: Normal rate, regular rhythm and normal heart sounds.   Pulmonary/Chest: Effort normal and breath sounds normal.  Abdominal: Soft. Bowel sounds are normal. She exhibits mass. There is tenderness. There is no guarding.  Genitourinary: There is no lesion on the right labia. There is no lesion on the left labia. There is tenderness in the vagina. No bleeding in the vagina. No vaginal discharge found.    Assessment & Plan:   1. Vaginal burning  - Urinalysis Dipstick - Cervicovaginal ancillary only - HSV(herpes simplex vrs) 1+2 ab-IgG - lidocaine (XYLOCAINE) 5 % ointment; APPLY SMALL AMOUNT TO AFFECTED AREA AS NEEDED FOR BURNING.  Dispense: 30 g; Refill: 1 - ibuprofen (ADVIL,MOTRIN) 600 MG tablet; Take 1 tablet (600 mg total) by mouth every 8 (eight) hours as needed (Take with food.).  Dispense: 30 tablet; Refill: 0  2. Chronic pelvic pain in female Chronic problem with obtaining workup, recommending gynecology referral if symptoms persist. - Cervicovaginal ancillary only - US Pelvis Complete; Future -  US Transvaginal Non-OB; Future - HSV(herpes simplex vrs) 1+2 ab-IgG - ibuprofen (ADVIL,MOTRIN) 600 MG tablet; Take 1 tablet (600 mg total) by mouth every 8 (eight) hours as needed (Take with food.).  Dispense: 30 tablet; Refill: 0  3. Viral URI with cough  - guaiFENesin-dextromethorphan (ROBITUSSIN DM) 100-10 MG/5ML syrup; Take 5 mLs by mouth every 4  (four) hours as needed for cough.  Dispense: 236 mL; Refill: 0 - fluticasone (FLONASE) 50 MCG/ACT nasal spray; Place 2 sprays into both nostrils daily.  Dispense: 16 g; Refill: 0  Meds ordered this encounter  Medications  . guaiFENesin-dextromethorphan (ROBITUSSIN DM) 100-10 MG/5ML syrup    Sig: Take 5 mLs by mouth every 4 (four) hours as needed for cough.    Dispense:  236 mL    Refill:  0    Order Specific Question:   Supervising Provider    Answer:   Quentin Angst [id number]  . fluticasone (FLONASE) 50 MCG/ACT nasal spray    Sig: Place 2 sprays into both nostrils daily.    Dispense:  16 g    Refill:  0    Order Specific Question:   Supervising Provider    Answer:   Quentin Angst [id number]  . lidocaine (XYLOCAINE) 5 % ointment    Sig: APPLY SMALL AMOUNT TO AFFECTED AREA AS NEEDED FOR BURNING.    Dispense:  30 g    Refill:  1    Order Specific Question:   Supervising Provider    Answer:   Quentin Angst [id number]  . ibuprofen (ADVIL,MOTRIN) 600 MG tablet    Sig: Take 1 tablet (600 mg total) by mouth every 8 (eight) hours as needed (Take with food.).    Dispense:  30 tablet    Refill:  0    Order Specific Question:   Supervising Provider    Answer:   Quentin Angst [id number]    Follow-up: Return if symptoms worsen or fail to improve.   Lizbeth Bark FNP

## 2017-06-06 NOTE — Progress Notes (Deleted)
   Subjective:  Patient ID: Amber Travis, female    DOB: 1981-01-05  Age: 36 y.o. MRN: [id number]  CC: Follow-up   HPI Amber Travis presents for irregular menses. Patient's last menstrual period was 03/30/2017 and has persisted Periods are irregular, lasting 4 days.She reports 2 month history of menstrual cycle occurring twice per month.  Dysmenorrhea:moderate, occurring throughout menses. Cyclic symptoms include cramping.  Current contraception: none. History of infertility: no. History of abnormal Pap smear: no. Denies any known history of ovarian cyst of fibroids. She is agreeable to oral contraception. Denies any history of migraine, blood clots/ bleeding disorders, or smoking. She does report occasional generalized headaches. She requests screening for diabetes. Mother and maternal aunt has history of diabetes. She c/o thickened nail discoloration for several months.   Burning sensation inside vagina 5 years  Burning worst  Intercourse burning  Lesion/discharge  Taking BC   Cold 1 year  Sore throat  Cough/sneezing Fever no  Outpatient Medications Prior to Visit  Medication Sig Dispense Refill  . naproxen (NAPROSYN) 500 MG tablet TAKE ONE TABLET BY MOUTH  TWICE A DAY WITH MEALS FOR 10 DAYS. THEN TAKE ONE TABLET BY MOUTH WITH MEALS DAILY AS NEEDED. 40 tablet 0  . nitrofurantoin, macrocrystal-monohydrate, (MACROBID) 100 MG capsule Take 1 capsule (100 mg total) by mouth 2 (two) times daily. 10 capsule 0  . Norgestimate-Ethinyl Estradiol Triphasic (ORTHO TRI-CYCLEN, 28,) 0.18/0.215/0.25 MG-35 MCG tablet Take 1 tablet by mouth daily. 1 Package 11   No facility-administered medications prior to visit.     ROS Review of Systems  Constitutional: Negative.   Respiratory: Negative.   Cardiovascular: Negative.   Gastrointestinal: Negative.   Genitourinary: Positive for menstrual problem, pelvic pain and vaginal bleeding.  Psychiatric/Behavioral: Negative.     Objective:  BP  113/80 (BP Location: Left Arm, Patient Position: Sitting, Cuff Size: Normal)   Pulse 79   Temp 98 F (36.7 C) (Oral)   Resp 18   Ht  (1.499 m)   Wt 167 lb 6.4 oz (75.9 kg)   SpO2 94%   BMI 33.81 kg/m   BP/Weight 06/06/2017 04/05/2017 12/08/2016  Systolic BP 113 101 111  Diastolic BP 80 69 75  Wt. (Lbs) 167.4 167.8 168.6  BMI 33.81 33.89 34.05     Physical Exam  Constitutional: She appears well-developed and well-nourished.  Neck: No JVD present.  Cardiovascular: Normal rate, regular rhythm, normal heart sounds and intact distal pulses.   Pulmonary/Chest: Effort normal and breath sounds normal.  Abdominal: Soft. Bowel sounds are normal. There is tenderness (suprapubic).  Skin: Skin is warm and dry.  Left great toenail, thickened and raised from nail bed.   Psychiatric: Her mood appears anxious. She expresses no homicidal and no suicidal ideation. She expresses no suicidal plans and no homicidal plans.  Nursing note and vitals reviewed.   Assessment & Plan:   Problem List Items Addressed This Visit    None      No orders of the defined types were placed in this encounter.   Follow-up: No Follow-up on file.   Lizbeth Bark FNP

## 2017-06-06 NOTE — Progress Notes (Signed)
Patient is here for f/up   Patient has vaginal burning sensation

## 2017-06-06 NOTE — Patient Instructions (Addendum)
Dolor plvico en la mujer (Pelvic Pain, Female) El dolor plvico se percibe en la parte baja del abdomen, por debajo del ombligo y [employer]. El dolor puede comenzar de forma repentina (agudo), reaparecer (recurrente) o durar mucho tiempo (crnico). Se considera que el dolor plvico que dura ms de seis meses es crnico. El dolor plvico puede tener numerosas causas. A veces, la causa no se conoce. CUIDADOS EN EL HOGAR  Tome los medicamentos de venta libre y los recetados solamente como se lo haya indicado el mdico.  Haga reposo como se lo haya indicado el mdico.  No tenga relaciones sexuales si le causan dolor.  Lleve un registro del dolor plvico. Escriba los siguientes datos: ? Cundo Radiation protection practitioner. ? La ubicacin del dolor. ? Qu cosas parecen Teacher, English as a foreign language, como los alimentos o la [place]. ? Los sntomas que tiene junto con Chief Technology Officer.  Concurra a todas las visitas de control como se lo haya indicado el mdico. Esto es importante. SOLICITE AYUDA SI:  Los medicamentos no Tourist information centre manager.  El dolor reaparece.  Aparecen nuevos sntomas.  Tiene secrecin o sangrado vaginal atpico.  Tiene fiebre o siente escalofros.  Tiene dificultad para defecar (estreimiento).  Observa sangre en la orina o en la materia fecal.  La orina tiene mal olor.  Se siente mareada o dbil. SOLICITE AYUDA DE INMEDIATO SI:  Siente un dolor repentino que es muy intenso.  El dolor contina empeorando.  El dolor es muy intenso y, [place], tiene alguno de los siguientes sntomas: ? Grant Ruts. ? Ganas de vomitar (nuseas). ? Vmitos. ? Wendall Stade.  Se desmaya (pierde el conocimiento). Esta informacin no tiene Theme park manager el consejo del mdico. Asegrese de hacerle al mdico cualquier pregunta que tenga. Document Released: 02/22/2012 Document Revised: 09/13/2014 Document Reviewed: 06/13/2015 Elsevier Interactive Patient Education  2018  [employer]. Infeccin del tracto respiratorio superior, adultos (Upper Respiratory Infection, Adult) La mayora de las infecciones del tracto respiratorio superior estn causadas por un virus. Un infeccin del tracto respiratorio superior afecta la nariz, la garganta y las vas respiratorias superiores. El tipo ms comn de infeccin del tracto respiratorio superior es el resfro comn. CUIDADOS EN EL HOGAR  Tome los medicamentos solamente como se lo haya indicado el mdico.  A fin de Engineer, materials de garganta, haga grgaras con solucin salina templada o consuma caramelos para la tos, como se lo haya indicado el mdico.  Use un humidificador de vapor clido o inhale el vapor de la ducha para aumentar la humedad del aire. Esto facilitar la respiracin.  Beba suficiente lquido para mantener el pis (orina) claro o de color amarillo plido.  Tome sopas y caldos transparentes.  Siga una dieta saludable.  Descanse todo lo que sea necesario.  Regrese al Aleen Campi cuando la fiebre haya desaparecido o el mdico le diga que puede Leonard. ? Es posible que deba quedarse en su casa durante un tiempo prolongado, para no transmitir la infeccin a los dems. ? Tambin puede usar un barbijo y lavarse las manos con frecuencia para evitar el contagio del virus.  Si tiene asma, use el inhalador con mayor frecuencia.  No consuma ningn producto que contenga tabaco, lo que incluye cigarrillos, tabaco de Theatre manager o Administrator, Civil Service. Si necesita ayuda para dejar de fumar, consulte al mdico. SOLICITE AYUDA SI:  Siente que empeora o que no mejora.  Los medicamentos no logran [employer].  Tiene escalofros.  La  dificultad para respirar Animator.  Tiene mucosidad marrn o roja.  Tiene una secrecin amarilla o marrn de la Clinical cytogeneticist.  Le duele la cara, especialmente al inclinarse hacia adelante.  Tiene fiebre.  Tiene los ganglios del cuello hinchados.  Siente dolor al  tragar.  Tiene zonas blancas en la parte de atrs de la garganta. SOLICITE AYUDA DE INMEDIATO SI:  Los siguientes sntomas son muy intensos o constantes: ? Dolor de [place]. ? Dolor de odos. ? Dolor en la frente, detrs de los ojos y por encima de los pmulos (dolor sinusal). ? Journalist, newspaper.  Tiene enfermedad pulmonar prolongada (crnica) y cualquiera de estos sntomas: ? Sibilancias. ? Tos prolongada. ? Tos con Montez Hageman. ? Cambio en la mucosidad habitual.  Presenta rigidez en el cuello.  Tiene cambios en: ? La visin. ? La audicin. ? El pensamiento. ? El [place] de nimo. ASEGRESE DE QUE:  Comprende estas instrucciones.  Controlar su afeccin.  Recibir ayuda de inmediato si no mejora o si empeora. Esta informacin no tiene Theme park manager el consejo del mdico. Asegrese de hacerle al mdico cualquier pregunta que tenga. Document Released: 01/25/2011 Document Revised: 01/07/2015 Document Reviewed: 11/28/2013 Elsevier Interactive Patient Education  2018 [employer].

## 2017-06-07 LAB — HSV(HERPES SIMPLEX VRS) I + II AB-IGG: HSV 1 Glycoprotein G Ab, IgG: 29.6 index — ABNORMAL HIGH (ref 0.00–0.90)

## 2017-06-07 LAB — CERVICOVAGINAL ANCILLARY ONLY
Bacterial vaginitis: POSITIVE — AB
CANDIDA VAGINITIS: NEGATIVE
CHLAMYDIA, DNA PROBE: NEGATIVE
NEISSERIA GONORRHEA: NEGATIVE
Trichomonas: NEGATIVE

## 2017-06-08 ENCOUNTER — Ambulatory Visit (HOSPITAL_COMMUNITY)
Admission: RE | Admit: 2017-06-08 | Discharge: 2017-06-08 | Disposition: A | Discharge status: Home / Self Care | Payer: Self-pay | Source: Ambulatory Visit | Attending: Family Medicine | Admitting: Family Medicine

## 2017-06-08 DIAGNOSIS — G8929 Other chronic pain: Secondary | ICD-10-CM | POA: Insufficient documentation

## 2017-06-08 DIAGNOSIS — R102 Pelvic and perineal pain: Secondary | ICD-10-CM | POA: Insufficient documentation

## 2017-06-08 IMAGING — US US PELVIS COMPLETE
1 series · 15 of 25 positions shown · non-contrast
Comparison: CT on [DATE]

CLINICAL DATA: Chronic pelvic pain in female.

EXAM:
TRANSABDOMINAL AND TRANSVAGINAL ULTRASOUND OF PELVIS
TECHNIQUE: Both transabdominal and transvaginal ultrasound examinations of the
pelvis were performed. Transabdominal technique was performed for
global imaging of the pelvis including uterus, ovaries, adnexal
regions, and pelvic cul-de-sac. It was necessary to proceed with
endovaginal exam following the transabdominal exam to visualize the
endometrium and ovaries.

[Series 1: us pelvis complete · 15 of 37 slices shown]
[im 1/37]
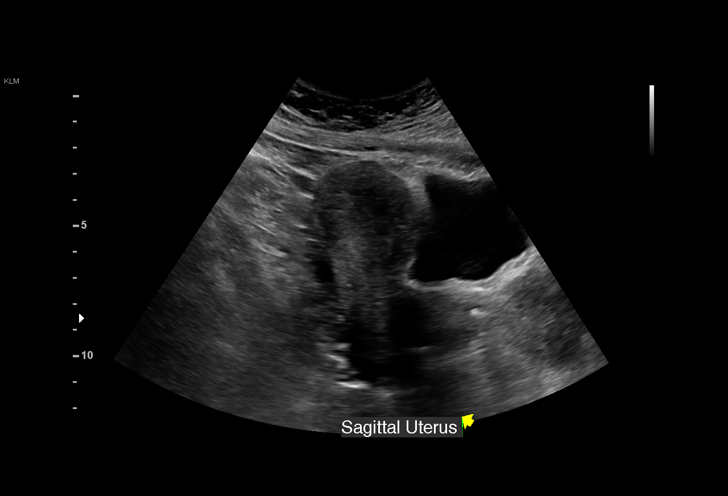
[im 4/37]
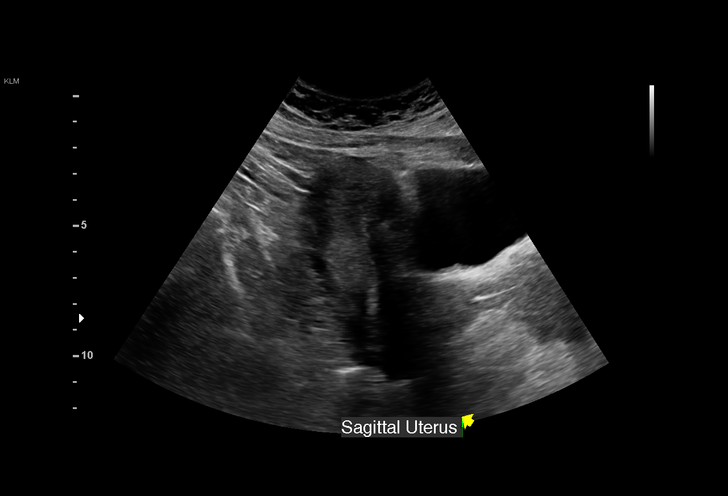
[im 7/37]
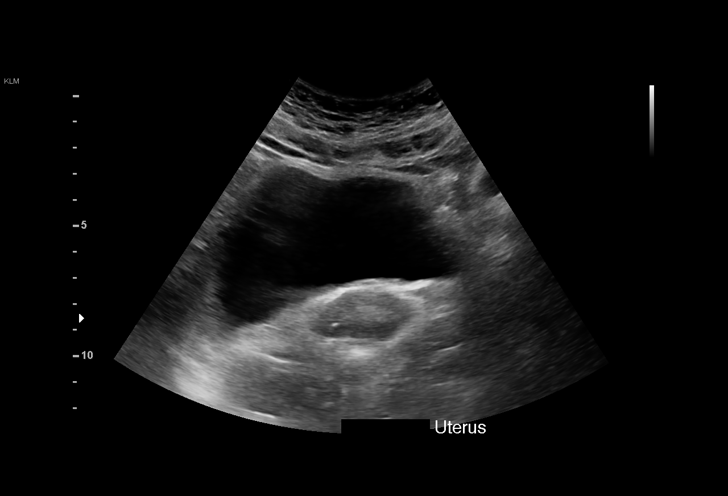
[im 8/37]
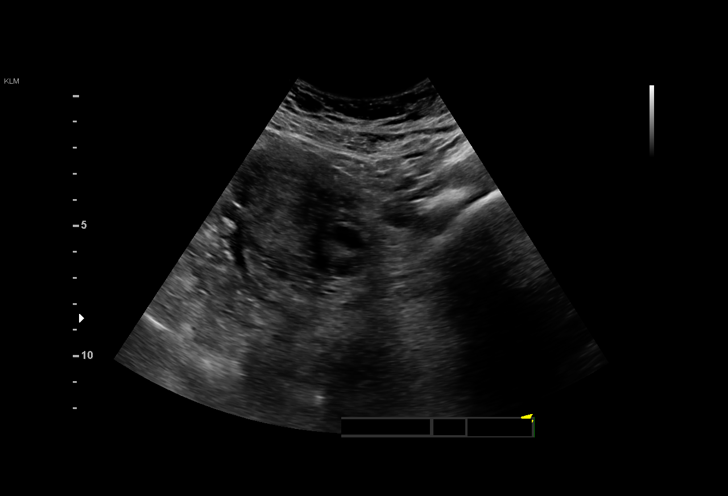
[im 11/37]
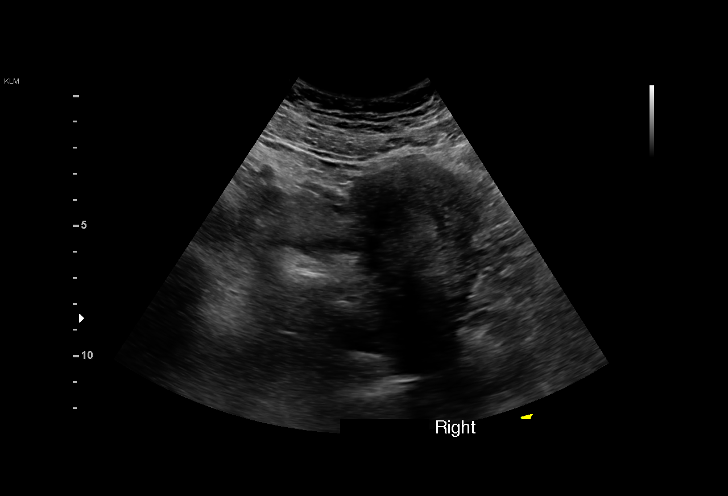
[im 14/37]
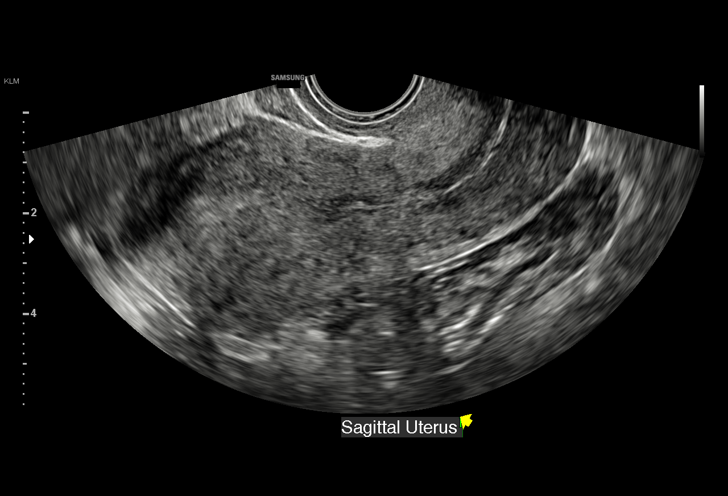
[im 16/37]
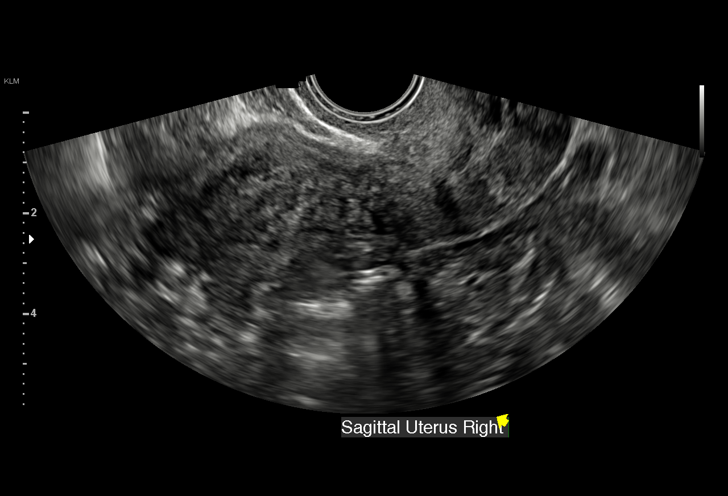
[im 19/37]
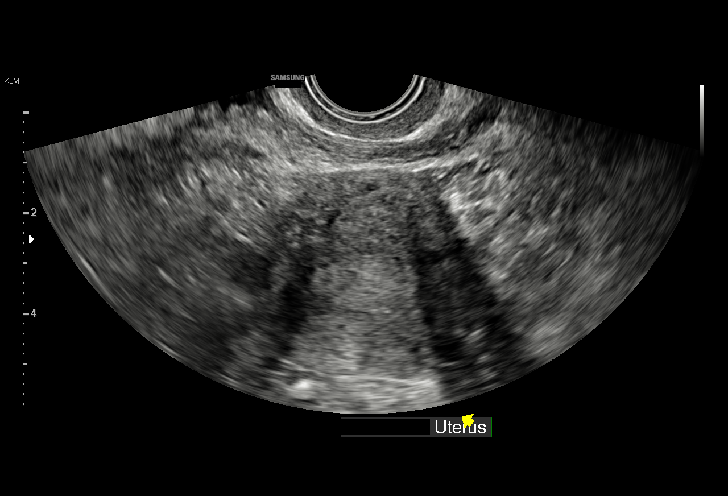
[im 22/37]
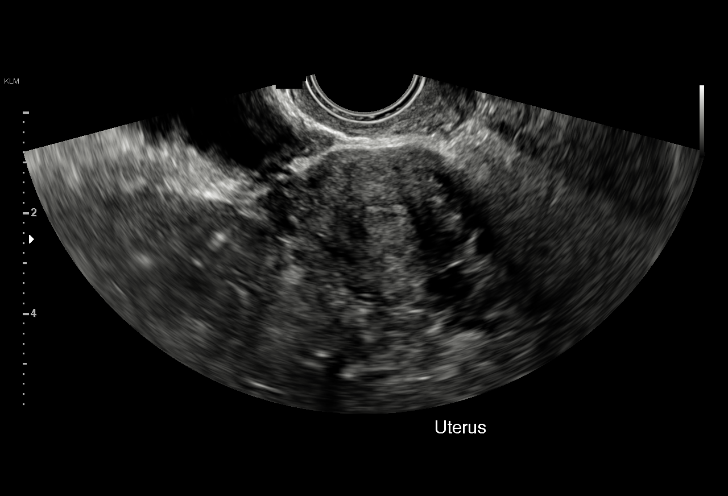
[im 23/37]
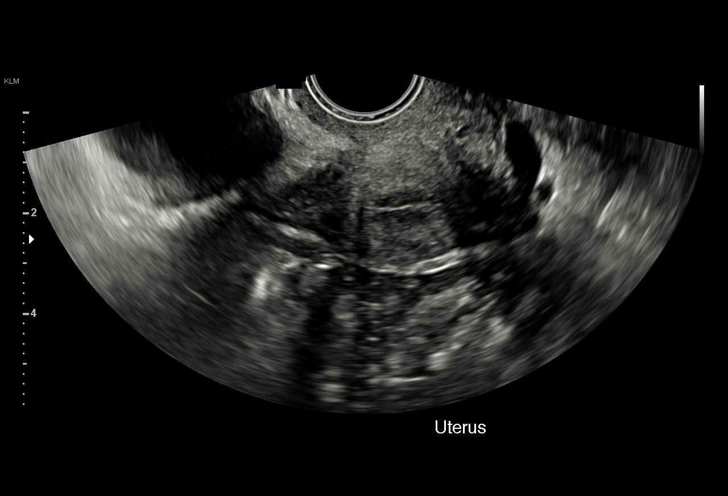
[im 26/37]
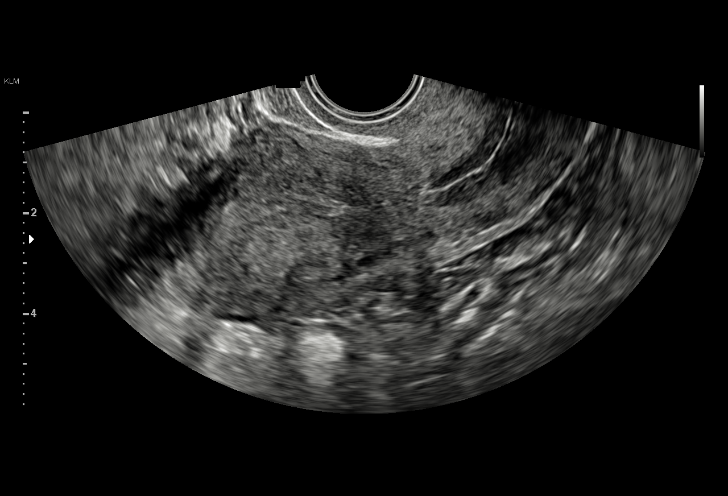
[im 29/37]
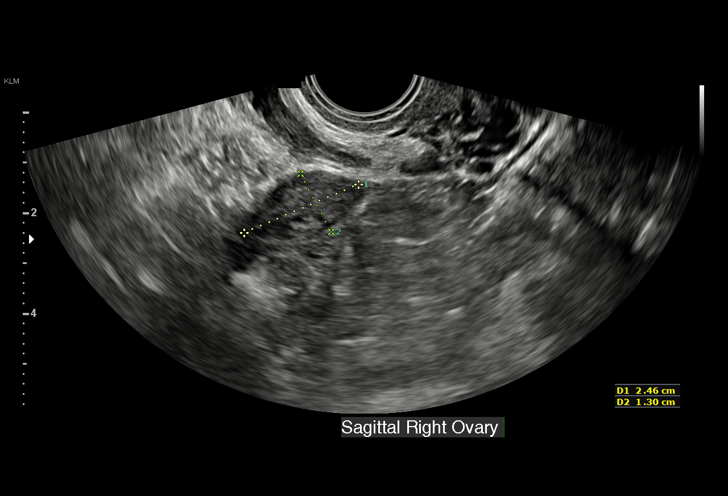
[im 31/37]
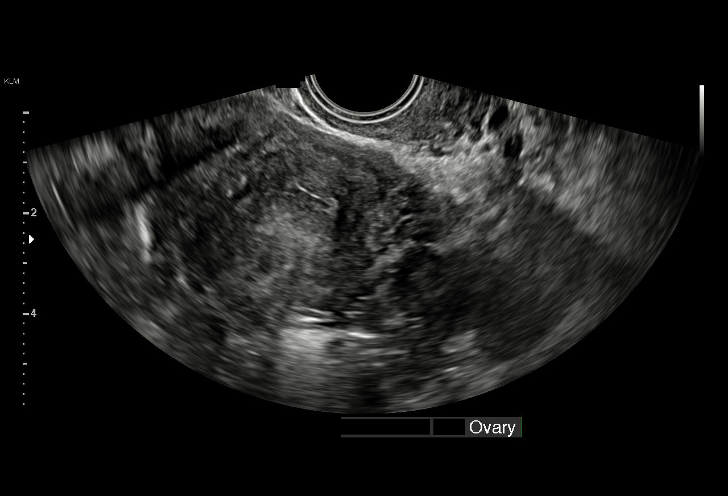
[im 34/37]
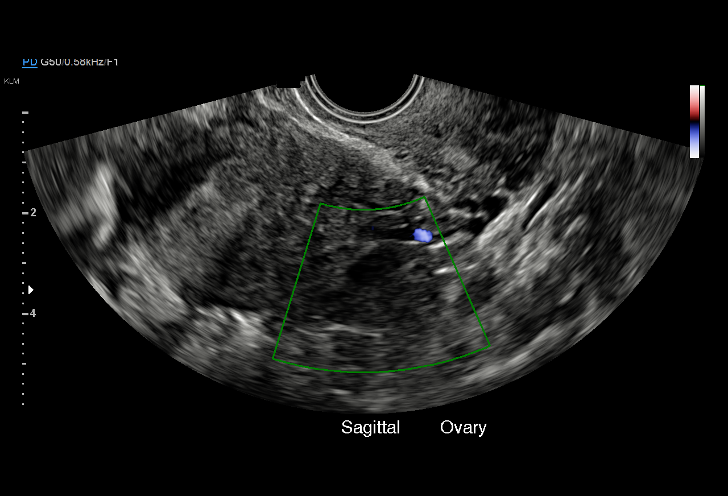
[im 37/37]
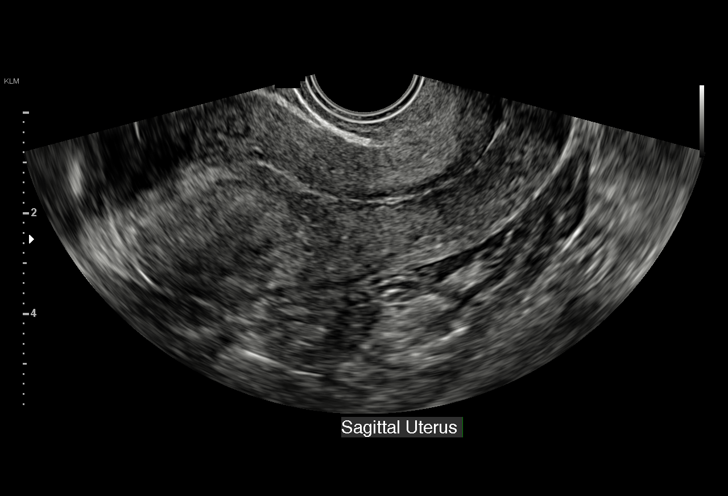

[15 of 25 positions shown; findings below may reference images not displayed]

FINDINGS: Uterus

Measurements: 8.9 x 4.1 x 4.4 cm. No fibroids or other mass
visualized.

Endometrium

Thickness: 6 mm.  No focal abnormality visualized.

Right ovary

Measurements: 2.5 x 1.3 x 2.0 cm. Normal appearance/no adnexal mass.

Left ovary

Measurements: 2.4 x 1.3 x 1.1 cm. Normal appearance/no adnexal mass.

Other findings

No abnormal free fluid.
IMPRESSION: Normal appearance of uterus and ovaries. No pelvic mass or other
significant abnormality identified.

## 2017-06-13 ENCOUNTER — Other Ambulatory Visit: Payer: Self-pay | Admitting: Family Medicine

## 2017-06-13 ENCOUNTER — Telehealth: Payer: Self-pay | Admitting: Family Medicine

## 2017-06-13 DIAGNOSIS — R102 Pelvic and perineal pain: Secondary | ICD-10-CM | POA: Insufficient documentation

## 2017-06-13 DIAGNOSIS — B9689 Other specified bacterial agents as the cause of diseases classified elsewhere: Secondary | ICD-10-CM

## 2017-06-13 DIAGNOSIS — N94819 Vulvodynia, unspecified: Secondary | ICD-10-CM

## 2017-06-13 DIAGNOSIS — N760 Acute vaginitis: Secondary | ICD-10-CM

## 2017-06-13 MED ORDER — METRONIDAZOLE 500 MG PO TABS: 500.0000 mg | ORAL_TABLET | Freq: Two times a day (BID) | ORAL | 0 refills | Status: DC

## 2017-06-13 MED FILL — metroNIDAZOLE 500 MG TABS: 500 | 7 days supply | Qty: 14 | Fill #0

## 2017-06-13 NOTE — Telephone Encounter (Signed)
-----   Message from Lizbeth Bark, FNP sent at 06/13/2017  9:21 AM EDT ----- Imaging studies showed normal appearance of the uterus and ovaries. No pelvic mass Herpes, Gonorrhea, Chlamydia, Yeast, and Trichomonas were all negative. Bacterial vaginosis was positive. BV is caused by an overgrowth of germs in the vagina. You will be prescribed metronidazole to treat.To reduce your risk of developing BV don't douche, don't use scented soap or sprays, and use protection during sexual intercourse. Herpes type 1 which is primarily responsible for cold sores is positive. When you have cold sores do not kiss anyone, share utensils, or have oral sex. You will be referred to gynecology due to chronic nature of symptoms.

## 2017-06-13 NOTE — Telephone Encounter (Signed)
CMA call regarding x ray results   Patient verify DOB   Patient was aware and understood      

## 2017-06-13 NOTE — Telephone Encounter (Signed)
Pt called to get the X-Ray result, please follow up

## 2017-06-16 ENCOUNTER — Telehealth: Payer: Self-pay | Admitting: Family Medicine

## 2017-06-16 NOTE — Telephone Encounter (Signed)
Pt called to speak with the provider since the med lidocaine (XYLOCAINE) 5 % ointment  Is not working, she has been out it on for more than a week and is not working, she asking if you can sent her something else that she can used, please follow up

## 2017-06-17 ENCOUNTER — Other Ambulatory Visit: Payer: Self-pay | Admitting: Family Medicine

## 2017-06-17 DIAGNOSIS — N94819 Vulvodynia, unspecified: Secondary | ICD-10-CM

## 2017-06-17 DIAGNOSIS — R894 Abnormal immunological findings in specimens from other organs, systems and tissues: Secondary | ICD-10-CM

## 2017-06-17 DIAGNOSIS — N949 Unspecified condition associated with female genital organs and menstrual cycle: Secondary | ICD-10-CM

## 2017-06-17 MED ORDER — LIDOCAINE 5 % EX OINT: TOPICAL_OINTMENT | CUTANEOUS | 1 refills | Status: DC

## 2017-06-17 MED ORDER — ACYCLOVIR 400 MG PO TABS: 400.0000 mg | ORAL_TABLET | Freq: Three times a day (TID) | ORAL | 3 refills | Status: DC

## 2017-06-17 NOTE — Telephone Encounter (Signed)
CMA call regarding medication been sent to [facility] & that she is going to be referral to [facility]   Patient was aware and understood

## 2017-06-17 NOTE — Telephone Encounter (Signed)
Prescribed acyclovir and lidocaine cream . Continue to take lidocaine cream as needed. Follow up with gynecology referral.

## 2017-06-20 MED FILL — ?ACYCLOVIR 400MG TABLET: 400 | 5 days supply | Qty: 15 | Fill #0

## 2017-06-20 MED FILL — LIDOCAINE 5 % OINT: 5 | 14 days supply | Qty: 35 | Fill #0

## 2017-06-29 MED FILL — TRI-LINYAH TABLET: 0.18/0.215/ | 28 days supply | Qty: 28 | Fill #3

## 2017-07-20 ENCOUNTER — Ambulatory Visit: Payer: Self-pay | Attending: Family Medicine

## 2017-07-27 MED FILL — TRI-LINYAH TABLET: 0.18/0.215/ | 28 days supply | Qty: 28 | Fill #4

## 2017-08-01 ENCOUNTER — Ambulatory Visit: Payer: Self-pay | Attending: Family Medicine

## 2017-08-18 ENCOUNTER — Ambulatory Visit (INDEPENDENT_AMBULATORY_CARE_PROVIDER_SITE_OTHER): Payer: Self-pay | Admitting: Obstetrics and Gynecology

## 2017-08-18 ENCOUNTER — Encounter: Payer: Self-pay | Admitting: Obstetrics and Gynecology

## 2017-08-18 VITALS — BP 102/82 | HR 76 | Ht <= 58 in | Wt 165.0 lb

## 2017-08-18 DIAGNOSIS — N898 Other specified noninflammatory disorders of vagina: Secondary | ICD-10-CM

## 2017-08-18 DIAGNOSIS — Z113 Encounter for screening for infections with a predominantly sexual mode of transmission: Secondary | ICD-10-CM

## 2017-08-18 DIAGNOSIS — R102 Pelvic and perineal pain: Secondary | ICD-10-CM

## 2017-08-18 NOTE — Progress Notes (Signed)
36 yo G5P4014 here for the evaluation of chronic vaginal pain. Patient reports a 5-year history of Amber Travis vaginal burning. She states the pain is worst with intercourse. She was treated for a BV infection a few months ago without improvement in symptoms. She was provided a lidocaine cream which has not provided any relief. She is sexually active using OCP for contraception. She denies any change in the pain intensity during her period. Patient is also complaining of left breast pain. She states the pain has been present for the past 2 years. Patient had a normal breast ultrasound in December 2017. Patient also reports feeling flushed in the face constantly with red cheeks. This developed a few weeks ago.  Past Medical History:  Diagnosis Date  . No pertinent past medical history    Past Surgical History:  Procedure Laterality Date  . LAPAROSCOPIC APPENDECTOMY N/A 05/10/2016   Procedure: APPENDECTOMY LAPAROSCOPIC;  Surgeon: Jimmye NormanJames Wyatt, MD;  [facility];  Service: General;  Laterality: N/A;   Family History  Problem Relation Age of Onset  . Diabetes Mother   . Diabetes Paternal Aunt    Social History   Tobacco Use  . Smoking status: Never Smoker  . Smokeless tobacco: Never Used  Substance Use Topics  . Alcohol use: No  . Drug use: No   ROS See pertinent in HPI  Blood pressure 102/82, pulse 76, height 4\' 5"  (1.346 m), weight 165 lb (74.8 kg), last menstrual period 08/03/2017, not currently breastfeeding. GENERAL: Well-developed, well-nourished female in no acute distress. Rosy cheeks BREASTS: Symmetric in size. No palpable masses or lymphadenopathy, skin changes, or nipple drainage. ABDOMEN: Soft, nontender, nondistended. No organomegaly. PELVIC: Normal external female genitalia. Vagina is pink and rugated.  Normal discharge. Normal appearing cervix. Uterus is normal in size. No adnexal mass or tenderness. EXTREMITIES: No cyanosis, clubbing, or edema, 2+ distal pulses.  A/P 36 yo  with vaginal pain  - Reassurance provided with breast exam. Advised patient to invest in a good support bra which she admits to not wearing a bra - Wet prep and cultures collected. If negative, may need to refer to physical therapy - Patient advised to follow up with PCP regarding facial erythema  - Patient will be contacted with results

## 2017-08-19 LAB — CERVICOVAGINAL ANCILLARY ONLY
Bacterial vaginitis: NEGATIVE
CANDIDA VAGINITIS: POSITIVE — AB
Chlamydia: NEGATIVE
Neisseria Gonorrhea: NEGATIVE
TRICH (WINDOWPATH): NEGATIVE

## 2017-08-20 ENCOUNTER — Other Ambulatory Visit: Payer: Self-pay | Admitting: Obstetrics and Gynecology

## 2017-08-20 MED ORDER — FLUCONAZOLE 150 MG PO TABS: 150.0000 mg | ORAL_TABLET | Freq: Once | ORAL | 1 refills | Status: AC

## 2017-08-22 MED FILL — FLUCONAZOLE 150 MG TABLET: 150 | 1 days supply | Qty: 1 | Fill #0

## 2017-08-25 ENCOUNTER — Telehealth: Payer: Self-pay

## 2017-08-25 ENCOUNTER — Other Ambulatory Visit: Payer: Self-pay

## 2017-08-25 MED ORDER — FLUCONAZOLE 150 MG PO TABS: 150.0000 mg | ORAL_TABLET | Freq: Once | ORAL | 0 refills | Status: AC

## 2017-08-25 NOTE — Telephone Encounter (Signed)
Call patient inform her of positive yeast infection results. [facility].

## 2017-08-25 NOTE — Telephone Encounter (Signed)
Patient tested positive for yeast. Diflucan has been sent to her pharmacy.

## 2017-08-25 NOTE — Telephone Encounter (Signed)
-----   Message from Catalina AntiguaPeggy Constant, MD sent at 08/20/2017  8:57 AM EST ----- Please inform patient that she has a yeast infection. Rx for diflucan has been e[facility]pres[facility] to [facility]

## 2017-08-31 MED FILL — TRI-LINYAH TABLET: 0.18/0.215/ | 28 days supply | Qty: 28 | Fill #5

## 2017-09-27 MED FILL — TRI-LINYAH TABLET: 0.18/0.215/ | 28 days supply | Qty: 28 | Fill #6

## 2017-10-27 MED FILL — TRI-LINYAH TABLET: 0.18/0.215/ | 28 days supply | Qty: 28 | Fill #7

## 2017-11-09 ENCOUNTER — Ambulatory Visit: Payer: Self-pay | Attending: Family Medicine | Admitting: Physician Assistant

## 2017-11-09 VITALS — BP 112/74 | HR 78 | Temp 98.1°F | Resp 18 | Ht 64.0 in | Wt 173.0 lb

## 2017-11-09 DIAGNOSIS — N898 Other specified noninflammatory disorders of vagina: Secondary | ICD-10-CM | POA: Insufficient documentation

## 2017-11-09 DIAGNOSIS — Z3009 Encounter for other general counseling and advice on contraception: Secondary | ICD-10-CM | POA: Insufficient documentation

## 2017-11-09 DIAGNOSIS — Z79899 Other long term (current) drug therapy: Secondary | ICD-10-CM | POA: Insufficient documentation

## 2017-11-09 DIAGNOSIS — G8929 Other chronic pain: Secondary | ICD-10-CM | POA: Insufficient documentation

## 2017-11-09 DIAGNOSIS — M549 Dorsalgia, unspecified: Secondary | ICD-10-CM | POA: Insufficient documentation

## 2017-11-09 DIAGNOSIS — N949 Unspecified condition associated with female genital organs and menstrual cycle: Secondary | ICD-10-CM

## 2017-11-09 DIAGNOSIS — M545 Low back pain, unspecified: Secondary | ICD-10-CM

## 2017-11-09 DIAGNOSIS — Z789 Other specified health status: Secondary | ICD-10-CM

## 2017-11-09 LAB — POCT URINALYSIS DIPSTICK
BILIRUBIN UA: NEGATIVE
GLUCOSE UA: NEGATIVE
Ketones, UA: NEGATIVE
Nitrite, UA: NEGATIVE
PH UA: 6 (ref 5.0–8.0)
Protein, UA: NEGATIVE
Spec Grav, UA: 1.01 (ref 1.010–1.025)
UROBILINOGEN UA: 0.2 U/dL

## 2017-11-09 MED ORDER — IBUPROFEN 600 MG PO TABS: 600.0000 mg | ORAL_TABLET | Freq: Three times a day (TID) | ORAL | 0 refills | Status: DC

## 2017-11-09 MED ORDER — FLUCONAZOLE 150 MG PO TABS: 150.0000 mg | ORAL_TABLET | Freq: Once | ORAL | 0 refills | Status: AC

## 2017-11-09 MED ORDER — METHOCARBAMOL 500 MG PO TABS
500.0000 mg | ORAL_TABLET | Freq: Four times a day (QID) | ORAL | 0 refills | Status: DC
Start: 2017-11-09 — End: 2018-02-27

## 2017-11-09 MED FILL — FLUCONAZOLE 150 MG TABLET: 150 | 1 days supply | Qty: 1 | Fill #0

## 2017-11-09 MED FILL — METHOCARBAMOL 500 MG TABLET: 500 | 22 days supply | Qty: 90 | Fill #0

## 2017-11-09 MED FILL — IBUPROFEN 600 MG TABLET: 600 | 20 days supply | Qty: 60 | Fill #0

## 2017-11-09 NOTE — Progress Notes (Signed)
Patient ID: Amber Travis, female   DOB: [birth year], 37 y.o.   MRN: [id number]      Amber Travis, is a 36 y.o. female  [id number]  GMW:[id number]  DOB - 1981/07/26  Subjective:  Chief Complaint and HPI: Amber Travis is a 37 y.o. female here today for a few concerns. "[name]" with [employer].  LBP X 1 week.  This has been occurring on and off X 5 years since having her son.  No paresthesias/weakness.  Unsure what exacerbated BP or caused current episode.  Pain is mostly midline.  No new injury or activities.  No problems moving bowels or bladder  No urinary s/sx except vaginal itching. Itching X 2 days.  No recent antibiotics.  No pelvic pain or fever.    Also wants referral for gyn/Ob for permanent BC such as BTL.    ROS:   Constitutional:  No f/c, No night sweats, No unexplained weight loss. EENT:  No vision changes, No blurry vision, No hearing changes. No mouth, throat, or ear problems.  Respiratory: No cough, No SOB Cardiac: No CP, no palpitations GI:  No abd pain, No N/V/D. GU: No Urinary s/sx; some vaginal itching Musculoskeletal: +LBP Neuro: No headache, no dizziness, no motor weakness.  Skin: No rash Endocrine:  No polydipsia. No polyuria.  Psych: Denies SI/HI  No problems updated.  ALLERGIES: No Known Allergies  PAST MEDICAL HISTORY: Past Medical History:  Diagnosis Date  . No pertinent past medical history     MEDICATIONS AT HOME: Prior to Admission medications   Medication Sig Start Date End Date Taking? Authorizing Provider  acyclovir (ZOVIRAX) 400 MG tablet Take 1 tablet (400 mg total) by mouth 3 (three) times daily. For 5 days. 06/17/17  Yes Hairston, Mandesia R, FNP  fluticasone (FLONASE) 50 MCG/ACT nasal spray Place 2 sprays into both nostrils daily. 06/06/17  Yes Hairston, Oren Beckmann, FNP  ibuprofen (ADVIL,MOTRIN) 600 MG tablet Take 1 tablet (600 mg total) by mouth 3 (three) times daily. X 5 days then prn pain 11/09/17  Yes Anders Simmonds, PA-C  fluconazole (DIFLUCAN) 150 MG tablet Take 1 tablet (150 mg total) by mouth once for 1 dose. 11/09/17 11/09/17  Anders Simmonds, PA-C  lidocaine (XYLOCAINE) 5 % ointment APPLY SMALL AMOUNT TO AFFECTED AREA AS NEEDED FOR BURNING. Patient not taking: Reported on 11/09/2017 06/17/17   Lizbeth Bark, FNP  methocarbamol (ROBAXIN) 500 MG tablet Take 1 tablet (500 mg total) by mouth 4 (four) times daily. X 5 days then Prn muscle spasm 11/09/17   Anders Simmonds, PA-C     Objective:  EXAM:   Vitals:   11/09/17 0935  BP: 112/74  Pulse: 78  Resp: 18  Temp: 98.1 F (36.7 C)  TempSrc: Oral  SpO2: 98%  Weight: 173 lb (78.5 kg)  Height: 5\' 4"  (1.626 m)    General appearance : A&OX3. NAD. Non-toxic-appearing HEENT: Atraumatic and Normocephalic.  PERRLA. EOM intact.  Neck: supple, no JVD. No cervical lymphadenopathy. No thyromegaly Chest/Lungs:  Breathing-non-labored, Good air entry bilaterally, breath sounds normal without rales, rhonchi, or wheezing  CVS: S1 S2 regular, no murmurs, gallops, rubs  Back:  +lumbar lordosis.  Some paraspinus muscle spasm B.  Neg SLR B.  ROM ~90% of normal.   Extremities: Bilateral Lower Ext shows no edema, both legs are warm to touch with = pulse throughout.  LEDTR=B throughout 1-2+ Neurology:  CN II-XII grossly intact, Non focal.   Psych:  TP linear. J/I WNL.  Normal speech. Appropriate eye contact and affect.  Skin:  No Rash  Data Review Lab Results  Component Value Date   HGBA1C 5.1 04/05/2017   HGBA1C 5.0 02/20/2014     Assessment & Plan   1. Vaginal burning - Urinalysis Dipstick - Urine cytology ancillary only - fluconazole (DIFLUCAN) 150 MG tablet; Take 1 tablet (150 mg total) by mouth once for 1 dose.  Dispense: 1 tablet; Refill: 0 Urine culture  2. Chronic back pain in female No red flags - ibuprofen (ADVIL,MOTRIN) 600 MG tablet; Take 1 tablet (600 mg total) by mouth 3 (three) times daily. X 5 days then prn pain  Dispense: 60  tablet; Refill: 0 - methocarbamol (ROBAXIN) 500 MG tablet; Take 1 tablet (500 mg total) by mouth 4 (four) times daily. X 5 days then Prn muscle spasm  Dispense: 90 tablet; Refill: 0  3. Birth control counseling Wants to [facility]   - Ambulatory referral to Obstetrics / Gynecology  4. Language barrier stratus interpreters used and additional time performing visit was required.      Patient have been counseled extensively about nutrition and exercise  Return in about 3 months (around 02/09/2018) for assign PCP.  The patient was given clear instructions to go to ER or return to medical center if symptoms don't improve, worsen or new problems develop. The patient verbalized understanding. The patient was told to call to get lab results if they haven't heard anything in the next week.     Amber CoAngela Daelon Dunivan, PA-C [facility] [place] , [place] [phone]   11/09/2017, 9:49 AM

## 2017-11-09 NOTE — Patient Instructions (Signed)
Dolor de espalda en adultos  (Back Pain, Adult)  El dolor de espalda es muy frecuente. A menudo mejora con el tiempo. La causa del dolor de espalda generalmente no es peligrosa. La mayora de las personas puede aprender a manejar el dolor de espalda por s mismas.  CUIDADOS EN EL HOGAR  Controle su dolor de espalda a fin de detectar algn cambio. Las siguientes indicaciones ayudarn a aliviar cualquier dolor que pueda sentir:   Mantngase activo. Comience con caminatas cortas sobre superficies planas si es posible. Trate de caminar un poco ms cada da.   Haga ejercicios con regularidad tal como le indic el mdico. El ejercicio ayuda a que su espalda se cure ms rpidamente. Tambin ayuda a prevenir futuras lesiones al mantener los msculos fuertes y flexibles.   No se siente, conduzca ni permanezca de pie durante ms de 30 minutos.   No permanezca en la cama. Si hace reposo ms de 1 a 2 das, puede demorar su recuperacin.   Sea cuidadoso al inclinarse o levantar un objeto. Use una tcnica apropiada para levantar peso:  ? Flexione las rodillas.  ? Mantenga el objeto cerca del cuerpo.  ? No gire.   Duerma sobre un colchn firme. Recustese sobre un costado y flexione las rodillas. Si se recuesta sobre la espalda, coloque una almohada debajo de las rodillas.   Tome los medicamentos solamente como se lo haya indicado el mdico.   Aplique hielo sobre la zona lesionada.  ? Ponga el hielo en una bolsa plstica.  ? Coloque una toalla entre la piel y la bolsa de hielo.  ? Deje el hielo durante 20minutos, 2 a 3veces por da, durante los primeros 2 o 3das. Despus de eso, puede alternar entre compresas de hielo y calor.   Evite sentir ansiedad o estrs. Encuentre maneras efectivas de lidiar con el estrs, como hacer ejercicio.   Mantenga un peso saludable. El peso excesivo ejerce tensin sobre la espalda.  SOLICITE AYUDA SI:   Siente dolor que no se alivia con reposo o medicamentos.   Siente cada vez ms  dolor que se extiende a las piernas o los glteos.   El dolor no mejora en una semana.   Siente dolor por la noche.   Pierde peso.   Siente escalofros o fiebre.  SOLICITE AYUDA DE INMEDIATO SI:   No puede controlar su materia fecal (heces) o el pis (orina).   Siente debilidad en las piernas o los brazos.   Siente prdida de la sensibilidad (adormecimiento) en las piernas o los brazos.   Tiene malestar estomacal (nuseas) o vomita.   Siente dolor de estmago (abdominal).   Siente que se desvanece (se desmaya).  Esta informacin no tiene como fin reemplazar el consejo del mdico. Asegrese de hacerle al mdico cualquier pregunta que tenga.  Document Released: 03/08/2011 Document Revised: 09/13/2014 Document Reviewed: 12/25/2013  Elsevier Interactive Patient Education  2018 Elsevier Inc.

## 2017-11-10 LAB — URINE CYTOLOGY ANCILLARY ONLY
Chlamydia: NEGATIVE
Neisseria Gonorrhea: NEGATIVE
TRICH (WINDOWPATH): NEGATIVE

## 2017-11-11 ENCOUNTER — Telehealth: Payer: Self-pay

## 2017-11-11 LAB — URINE CULTURE

## 2017-11-11 NOTE — Telephone Encounter (Signed)
CMA called patient to inform on lab results.  Amber Travis assist with the call.   Patient understood and no questions.

## 2017-11-11 NOTE — Telephone Encounter (Signed)
-----   Message from Anders SimmondsAngela M McClung, [place]-C sent at 11/11/2017  3:03 PM EST ----- Urine and vaginal cultures from the urine were negative.  Return to clinic if you continue to have problems. Thanks, Georgian CoAngela McClung, PA-C

## 2017-11-12 LAB — URINE CYTOLOGY ANCILLARY ONLY
Bacterial vaginitis: NEGATIVE
Candida vaginitis: NEGATIVE

## 2017-11-14 ENCOUNTER — Encounter: Payer: Self-pay | Admitting: Obstetrics and Gynecology

## 2017-11-16 ENCOUNTER — Telehealth: Payer: Self-pay | Admitting: *Deleted

## 2017-11-16 NOTE — Telephone Encounter (Signed)
C/o vaginal itching on outer area. Lab test were negative. Using lidocaine  Please advise  Notes recorded by Anders SimmondsMcClung, Angela M, PA-C on 11/15/2017 at 1:18 PM EDT Vaginal tests from urine did not show any bacteria or STD to be concerned about. Follow-up if you continue to have problems. Thanks, Georgian CoAngela McClung, PA-C

## 2017-11-17 NOTE — Telephone Encounter (Signed)
I already treated her for yeast(and the test was negative for yeast).  This has been an ongoing problem for years.  I would recommend she follow-up with Dr Mable Parisonstant(the gynecologist for this problem. Thanks, Georgian CoAngela Soriah Leeman, PA-C

## 2017-11-18 NOTE — Telephone Encounter (Signed)
Pt aware of f/u directions per Georgian CoAngela McClung, PAC with GYN. Given phone number to contact GYN for further evaluation.  Interpreter assistance provided by [employer], [place], [phone]

## 2017-11-21 MED FILL — TRI-LINYAH TABLET: 0.18/0.215/ | 28 days supply | Qty: 28 | Fill #8

## 2017-12-19 ENCOUNTER — Ambulatory Visit (INDEPENDENT_AMBULATORY_CARE_PROVIDER_SITE_OTHER): Payer: Self-pay | Admitting: Obstetrics and Gynecology

## 2017-12-19 ENCOUNTER — Encounter: Payer: Self-pay | Admitting: Obstetrics and Gynecology

## 2017-12-19 ENCOUNTER — Ambulatory Visit (INDEPENDENT_AMBULATORY_CARE_PROVIDER_SITE_OTHER): Payer: Self-pay | Admitting: Clinical

## 2017-12-19 VITALS — BP 116/95 | HR 85 | Ht <= 58 in | Wt 172.0 lb

## 2017-12-19 DIAGNOSIS — F329 Major depressive disorder, single episode, unspecified: Secondary | ICD-10-CM

## 2017-12-19 DIAGNOSIS — F4323 Adjustment disorder with mixed anxiety and depressed mood: Secondary | ICD-10-CM

## 2017-12-19 DIAGNOSIS — R102 Pelvic and perineal pain: Secondary | ICD-10-CM

## 2017-12-19 DIAGNOSIS — F32A Depression, unspecified: Secondary | ICD-10-CM

## 2017-12-19 MED ORDER — NITROFURANTOIN MONOHYD MACRO 100 MG PO CAPS: 100.0000 mg | ORAL_CAPSULE | Freq: Two times a day (BID) | ORAL | 0 refills | Status: DC

## 2017-12-19 MED ORDER — SERTRALINE HCL 50 MG PO TABS: 50.0000 mg | ORAL_TABLET | Freq: Every day | ORAL | 2 refills | Status: DC

## 2017-12-19 MED FILL — TRI-LINYAH TABLET: 0.18/0.215/ | 28 days supply | Qty: 28 | Fill #9

## 2017-12-19 MED FILL — SERTRALINE HCL 50 MG TABS: 50 | 30 days supply | Qty: 27 | Fill #0

## 2017-12-19 MED FILL — ?NITROFURANTOIN-MACRO 100 M: 100 | 38 days supply | Qty: 45 | Fill #0

## 2017-12-19 NOTE — Progress Notes (Signed)
Patient has elevated phq9 & gad 7- patient agrees to see Asher MuirJamie.

## 2017-12-19 NOTE — Progress Notes (Signed)
Patient ID: [name], female   DOB: Feb 19, 1981, 37 y.o.   MRN: [id number] MS Felipa FurnaceGarcia is here for follow up of her chronic pelvic pain. Has had x 5 yrs Work up thus far has been negative. Reports pain daily, worsens with IC, denies bowel or bladder dysfunction Sexual active but rarely d/t to pain Cycles regular with OCP's  PE AF  VSS Lungs clear  Heart RRR Abd soft + BS GU Nl EGBUS no vestibular pain, no vaginal discharge, cervix without lesions, + bladder tenderness, uterus small mobile non tender no masses  A/P Chronic Pelvic Pain        Depression  Suspect pt's pain is related to her bladder. Will Tx with Macrobid x 7 days and then qhs x 30 days. Hopefully this will eliminate her pain. If not consider referral to PT and urology ? IC. Pt to see Asher MuirJamie today. Will also start Zoloft. [facility] F/U in 6 weeks

## 2017-12-19 NOTE — BH Specialist Note (Signed)
Integrated Behavioral Health Initial Visit  MRN: [id number] Name: Amber Travis  Number of Integrated Behavioral Health Clinician visits:: 1/6 Session Start time: 12:13  Session End time: 12:35 Total time: 20 minutes  Type of Service: Integrated Behavioral Health- Individual/Family Interpretor:Yes.   Interpretor Name and Language:Blanca, Spanish   Warm Hand Off Completed.       SUBJECTIVE: Amber Travis is a 37 y.o. female accompanied by 4yo son Patient was referred by Dr Alysia PennaErvin for depression and anxiety. Patient reports the following symptoms/concerns: Pt primary symptoms are depression, anxiety, insomnia, fatigue, poor appetite, worry, restlessness and irritability. Pt wonders if her physical pain could be related to symptoms of fatigue and anxiousness, and attributes some stress to worry over the possibility of her oldest child leaving home.  Duration of problem: Increase in over one month; Severity of problem: severe  OBJECTIVE: Mood: Anxious and Affect: Tearful Risk of harm to self or others: No plan to harm self or others  LIFE CONTEXT: Family and Social: Pt lives with her boyfriend and  four children (ages from 804-18) School/Work: - Self-Care: Recognizing need for greater self-care Life Changes: Increase in physical pain; oldest child turning 2218  GOALS ADDRESSED: Patient will: 1. Reduce symptoms of: anxiety, depression, insomnia and stress 2. Increase knowledge and/or ability of: coping skills  3. Demonstrate ability to: Increase healthy adjustment to current life circumstances  INTERVENTIONS: Interventions utilized: Sleep Hygiene and Psychoeducation and/or Health Education  Standardized Assessments completed: GAD-7 and PHQ 9  ASSESSMENT: Patient currently experiencing Adjustment disorder with mixed anxious and depressed mood   Patient may benefit from psychoeducation and brief therapeutic interventions regarding coping with symptoms of anxiety and  depression .  PLAN: 1. Follow up with behavioral health clinician on : Before next medical appointment; pt will schedule 2. Behavioral recommendations:  -Consider spending 5 minutes daily deep breathing; use app with breathing -Read educational materials regarding coping with symptoms of anxiety and depression  3. Referral(s): Integrated Behavioral Health Services (In Clinic) 4. "From scale of 1-10, how likely are you to follow plan?": 9  Rae LipsJamie C McMannes, LCSW   Depression screen [facility] 2/9 12/19/2017 11/09/2017 06/06/2017 04/05/2017 12/08/2016  Decreased Interest 1 0 1 1 0  Down, Depressed, Hopeless 3 1 0 2 1  PHQ - 2 Score 4 1 1 3 1   Altered sleeping 3 3 3 3 3   Tired, decreased energy 3 1 1 1 1   Change in appetite 3 2 3 2 1   Feeling bad or failure about yourself  1 1 0 - 1  Trouble concentrating 1 1 1 2 3   Moving slowly or fidgety/restless 0 0 1 3 1   Suicidal thoughts 0 0 0 0 0  PHQ-9 Score 15 9 10 14 11    GAD 7 : Generalized Anxiety Score 12/19/2017 11/09/2017 06/06/2017 04/05/2017  Nervous, Anxious, on Edge 3 3 1 1   Control/stop worrying 3 3 1 1   Worry too much - different things 1 1 2 2   Trouble relaxing 3 3 3  -  Restless 1 1 1 1   Easily annoyed or irritable 3 1 1 2   Afraid - awful might happen 1 1 3 1   Total GAD 7 Score 15 13 12  -

## 2017-12-21 ENCOUNTER — Telehealth: Payer: Self-pay | Admitting: General Practice

## 2017-12-21 NOTE — Telephone Encounter (Signed)
Patient called and left message in spanish on nurse voicemail line stating the medication she was given for a UTI is not making her feel well & wants to know if this is normal. Called patient with [employer] for interpreter. Patient reports dizziness, pain in her stomach that feels like burning & nausea starting yesterday evening. Patient states it happens immediately after she takes the macrobid. Patient reports taking zoloft at 2pm and macrobid at 4pm yesterday. Patient states she started having burning in her stomach and nausea immediately after. Per chart review, patient has been on macrobid before. Suggested to patient she make sure she is taking the antibiotic at the beginning of a meal. Patient verbalized understanding & asked if the depression medication could make her tired. Told patient it is possible and recommended she take it at nighttime. Patient verbalized understanding & had no other questions

## 2018-01-16 MED FILL — TRI-LINYAH TABLET: 0.18/0.215/ | 28 days supply | Qty: 28 | Fill #10

## 2018-01-16 MED FILL — SERTRALINE HCL 50 MG TABS: 50 | 30 days supply | Qty: 27 | Fill #1

## 2018-02-01 ENCOUNTER — Ambulatory Visit (INDEPENDENT_AMBULATORY_CARE_PROVIDER_SITE_OTHER): Payer: Self-pay | Admitting: Obstetrics and Gynecology

## 2018-02-01 ENCOUNTER — Encounter: Payer: Self-pay | Admitting: Obstetrics and Gynecology

## 2018-02-01 VITALS — BP 134/77 | HR 73 | Ht <= 58 in | Wt 171.6 lb

## 2018-02-01 DIAGNOSIS — F32A Depression, unspecified: Secondary | ICD-10-CM

## 2018-02-01 DIAGNOSIS — F329 Major depressive disorder, single episode, unspecified: Secondary | ICD-10-CM

## 2018-02-01 DIAGNOSIS — R102 Pelvic and perineal pain: Secondary | ICD-10-CM

## 2018-02-01 MED ORDER — DESOGESTREL-ETHINYL ESTRADIOL 0.15-30 MG-MCG PO TABS: 1.0000 | ORAL_TABLET | Freq: Every day | ORAL | 11 refills | Status: DC

## 2018-02-01 MED ORDER — SERTRALINE HCL 50 MG PO TABS: 50.0000 mg | ORAL_TABLET | Freq: Every day | ORAL | 2 refills | Status: DC

## 2018-02-01 NOTE — Progress Notes (Signed)
Patient ID: Amber Travis, female   DOB: October 19, 1980, 37 y.o.   MRN: [id number] Ms Felipa Furnace presents for follow from pelvic pain. She reports that her pain has resolved after completing the course of Macrobid. She would like to switch OCP's d/t decrease libedio. Depression is improved with Zoloft.   PE AF VSS Lungs clear Heart RRR Abd soft + BS GU Nl EGBUS uterus small mobile non tender no mass bladder non tender  A/P Pelvic Pain, resolved        Depression, Zoloft refilled OCP's changed to [facility]. Pt had pap with [facility] program. Has appt for continued care of her depression with PCP next month F/U in 1 yr or PRN

## 2018-02-01 NOTE — Patient Instructions (Signed)
[place] (Health Maintenance, Female) Un estilo de vida saludable y los cuidados preventivos pueden favorecer considerablemente a la salud y Musician. Pregunte a su mdico cul es el cronograma de exmenes peridicos apropiado para usted. Esta es una buena oportunidad para consultarlo sobre cmo prevenir enfermedades y [place] sano. Adems de los controles, hay muchas otras cosas que puede hacer usted mismo. Los expertos han realizado numerosas investigaciones [employer] cambios en el estilo de vida y las medidas de prevencin que, [place], lo ayudarn a mantenerse sano. Solicite a su mdico ms informacin. EL PESO Y LA DIETA Consuma una dieta saludable.  Asegrese de [employer] verduras, frutas, productos lcteos de bajo contenido de [place] y Advertising account planner.  No consuma muchos alimentos de alto contenido de grasas slidas, azcares agregados o sal.  Realice actividad fsica con regularidad. Esta es una de las prcticas ms importantes que puede hacer por su salud. ? La Delorise Shiner de los adultos deben hacer ejercicio durante al menos 124mnutos por semana. El ejercicio debe aumentar la frecuencia cardaca y pActorla transpiracin (ejercicio de [place]. ? La mayora de los adultos tambin deben hacer ejercicios de elongacin al mToysRusveces a la semana. Agregue esto al su plan de ejercicio de intensidad moderada. Mantenga un peso saludable.  El ndice de masa corporal ([facility] es una medida que puede utilizarse para identificar posibles problemas de pEast Uniontown Proporciona una estimacin de la grasa corporal basndose en el peso y la altura. Su mdico puede ayudarle a dRadiation protection practitionerISouth Endy a lScientist, forensico mTheatre managerun peso saludable.  Para las mujeres de 20aos o ms: ? Un IJohn R. Oishei Children'S Hospitalmenor de 18,5 se considera bajo peso. ? Un ICumberland County Hospitalentre 18,5 y 24,9 es normal. ? Un IPelham Medical Centerentre 25 y 29,9 se considera sobrepeso. ? Un IMC de 30 o ms se considera  obesidad. Observe los niveles de colesterol y lpidos en la sangre.  Debe comenzar a rEnglish as a second language teacherde lpidos y cResearch officer, trade unionen la sangre a los 20aos y luego repetirlos cada 516aos  Es posible que nAutomotive engineerlos niveles de colesterol con mayor frecuencia si: ? Sus niveles de lpidos y colesterol son altos. ? Es mayor de 527CWC ? Presenta un alto riesgo de padecer enfermedades cardacas. DETECCIN DE CNCER Cncer de pulmn  Se recomienda realizar exmenes de deteccin de cncer de pulmn a personas adultas entre 574y 892aos que estn en riesgo de dHorticulturist, commercialde pulmn por sus antecedentes de consumo de tabaco.  Se recomienda una tomografa computarizada de baja dosis de los pulmones todos los aos a las personas que: ? Fuman actualmente. ? Hayan dejado el hbito en algn momento en los ltimos 15aos. ? Hayan fumado durante 30aos un paquete diario. Un paquete-ao equivale a fumar un promedio de un paquete de cigarrillos diario durante un ao.  Los exmenes de deteccin anuales deben continuar hasta que hayan pasado 15aos desde que dej de fumar.  Ya no debern realizarse si tiene un problema de salud que le impida recibir tratamiento para eScience writerde pulmn. Cncer de mama  Practique la autoconciencia de la mama. Esto significa reconocer la apariencia normal de sus mamas y cmo las siente.  Tambin significa realizar autoexmenes regulares de lJohnson & Johnson Informe a su mdico sobre cualquier cambio, sin importar cun pequeo sea.  Si tiene entre 20 y 363aos, un mdico debe realizarle un examen clnico de las mamas como parte del examen regular de s[place] cada 1 a  3aos.  Si tiene 40aos o ms, debe realizarse un examen clnico de las mamas todos los aos. Tambin considere realizarse una radiografa de las mamas (mamografa) todos los aos.  Si tiene antecedentes familiares de cncer de mama, hable con su mdico para someterse a un estudio gentico.  Si  tiene alto riesgo de padecer cncer de mama, hable con su mdico para someterse a una resonancia magntica y una mamografa todos los aos.  La evaluacin del gen del cncer de mama (BRCA) se recomienda a mujeres que tengan familiares con cnceres relacionados con el BRCA. Los cnceres relacionados con el BRCA incluyen los siguientes: ? Mama. ? Ovario. ? Trompas. ? Cnceres de peritoneo.  Los resultados de la evaluacin determinarn la necesidad de asesoramiento gentico y de anlisis de BRCA1 y BRCA2. Cncer de cuello del tero El mdico puede recomendarle que se haga pruebas peridicas de deteccin de cncer de los rganos de la pelvis (ovarios, tero y vagina). Estas pruebas incluyen un examen plvico, que abarca controlar si se produjeron cambios microscpicos en la superficie del cuello del tero (prueba de Papanicolaou). Pueden recomendarle que se haga estas pruebas cada 3aos, a partir de los 21aos.  A las mujeres que tienen entre 30 y 65aos, los mdicos pueden recomendarles que se sometan a exmenes plvicos y pruebas de Papanicolaou cada 3aos, o a la prueba de Papanicolaou y el examen plvico en combinacin con estudios de deteccin del virus del papiloma humano (VPH) cada 5aos. Algunos tipos de VPH aumentan el riesgo de padecer cncer de cuello del tero. La prueba para la deteccin del VPH tambin puede realizarse a mujeres de cualquier edad cuyos resultados de la prueba de Papanicolaou no sean claros.  Es posible que otros mdicos no recomienden exmenes de deteccin a mujeres no embarazadas que se consideran sujetos de bajo riesgo de padecer cncer de pelvis y que no tienen sntomas. Pregntele al mdico si un examen plvico de deteccin es adecuado para usted.  Si ha recibido un tratamiento para el cncer cervical o una enfermedad que podra causar cncer, necesitar realizarse una prueba de Papanicolaou y controles durante al menos 20 aos de concluido el tratamiento. Si no se  ha hecho el Papanicolaou con regularidad, debern volver a evaluarse los factores de riesgo (como tener un nuevo compaero sexual), para determinar si debe realizarse los estudios nuevamente. Algunas mujeres sufren problemas mdicos que aumentan la probabilidad de contraer cncer de cuello del tero. En estos casos, el mdico podr indicar que se realicen controles y pruebas de Papanicolaou con ms frecuencia. Cncer colorrectal  Este tipo de cncer puede detectarse y a menudo prevenirse.  Por lo general, los estudios de rutina se deben comenzar a hacer a partir de los 50 aos y hasta los 75 aos.  Sin embargo, el mdico podr aconsejarle que lo haga antes, si tiene factores de riesgo para el cncer de colon.  Tambin puede recomendarle que use un kit de prueba para hallar sangre oculta en la materia fecal.  Es posible que se use una pequea cmara en el extremo de un tubo para examinar directamente el colon (sigmoidoscopia o colonoscopia) a fin de detectar formas tempranas de cncer colorrectal.  Los exmenes de rutina generalmente comienzan a los 50aos.  El examen directo del colon se debe repetir cada 5 a 10aos hasta los 75aos. Sin embargo, es posible que se realicen exmenes con mayor frecuencia, si se detectan formas tempranas de plipos precancerosos o pequeos bultos. Cncer de piel  Revise la piel   de la cabeza a los pies con regularidad.  Informe a su mdico si aparecen nuevos lunares o los que tiene se modifican, especialmente en su forma y color.  Tambin notifique al mdico si tiene un lunar que es ms grande que el tamao de una goma de lpiz.  Siempre use pantalla solar. Aplique pantalla solar de manera libre y repetida a lo largo del da.  Protjase usando mangas y pantalones largos, un sombrero de ala ancha y gafas para el sol, siempre que se encuentre en el exterior. ENFERMEDADES CARDACAS, DIABETES E HIPERTENSIN ARTERIAL  La hipertensin arterial causa  enfermedades cardacas y aumenta el riesgo de ictus. La hipertensin arterial es ms probable en los siguientes casos: ? Las personas que tienen la presin arterial en el extremo del rango normal (100-139/85-89 mm Hg). ? Las personas con sobrepeso u obesidad. ? Las personas afroamericanas.  Si usted tiene entre 18 y 39 aos, debe medirse la presin arterial cada 3 a 5 aos. Si usted tiene 40 aos o ms, debe medirse la presin arterial todos los aos. Debe medirse la presin arterial dos veces: una vez cuando est en un hospital o una clnica y la otra vez cuando est en otro sitio. Registre el promedio de las dos mediciones. Para controlar su presin arterial cuando no est en un hospital o una clnica, puede usar lo siguiente: ? Una mquina automtica para medir la presin arterial en una farmacia. ? Un monitor para medir la presin arterial en el hogar.  Si tiene entre 55 y 79 aos, consulte a su mdico si debe tomar aspirina para prevenir el ictus.  Realcese exmenes de deteccin de la diabetes con regularidad. Esto incluye la toma de una muestra de sangre para controlar el nivel de azcar en la sangre durante el ayuno. ? Si tiene un peso normal y un bajo riesgo de padecer diabetes, realcese este anlisis cada tres aos despus de los 45aos. ? Si tiene sobrepeso y un alto riesgo de padecer diabetes, considere someterse a este anlisis antes o con mayor frecuencia. PREVENCIN DE INFECCIONES HepatitisB  Si tiene un riesgo ms alto de contraer hepatitis B, debe someterse a un examen de deteccin de este virus. Se considera que tiene un alto riesgo de contraer hepatitis B si: ? Naci en un pas donde la hepatitis B es frecuente. Pregntele a su mdico qu pases son considerados de alto riesgo. ? Sus padres nacieron en un pas de alto riesgo y usted no recibi una vacuna que lo proteja contra la hepatitis B (vacuna contra la hepatitis B). ? Tiene VIH o sida. ? Usa agujas para inyectarse  drogas. ? Vive con alguien que tiene hepatitis B. ? Ha tenido sexo con alguien que tiene hepatitis B. ? Recibe tratamiento de hemodilisis. ? Toma ciertos medicamentos para el cncer, trasplante de rganos y afecciones autoinmunitarias. Hepatitis C  Se recomienda un anlisis de sangre para: ? Todos los que nacieron entre 1945 y 1965. ? Todas las personas que tengan un riesgo de haber contrado hepatitis C. Enfermedades de transmisin sexual (ETS).  Debe realizarse pruebas de deteccin de enfermedades de transmisin sexual (ETS), incluidas gonorrea y clamidia si: ? Es sexualmente activo y es menor de 24aos. ? Es mayor de 24aos, y el mdico le informa que corre riesgo de tener este tipo de infecciones. ? La actividad sexual ha cambiado desde que le hicieron la ltima prueba de deteccin y tiene un riesgo mayor de tener clamidia o gonorrea. Pregntele al mdico si usted   tiene riesgo.  Si no tiene el VIH, pero corre riesgo de infectarse por el virus, se recomienda tomar diariamente un medicamento recetado para evitar la infeccin. Esto se conoce como profilaxis previa a la exposicin. Se considera que est en riesgo si: ? Es [place] sexualmente y no [place] preservativos habitualmente o no conoce el estado del VIH de sus Advertising copywriter. ? Se inyecta drogas. ? Es [place] sexualmente con Ardelia Mems pareja que tiene VIH. Consulte a su mdico para saber si tiene un alto riesgo de infectarse por el VIH. Si opta por comenzar la profilaxis previa a la exposicin, primero debe realizarse anlisis de deteccin del VIH. Luego, le harn anlisis cada 34mses mientras est tomando los medicamentos para la profilaxis previa a la exposicin. [facility] Si es premenopusica y puede quedar e[place] solicite a su mdico asesoramiento previo a la concepcin.  Si puede quedar embarazada, tome 400 a [id number]([id number]) de cido fAnheuser-Busch  Si desea evitar el embarazo, hable con su mdico sobre el  control de la natalidad (anticoncepcin). OSTEOPOROSIS Y MENOPAUSIA  La osteoporosis es una enfermedad en la que los huesos pierden los minerales y la fuerza por el avance de la edad. El resultado pueden ser fracturas graves en los hSay[place] El riesgo de osteoporosis puede identificarse con uArdelia Memsprueba de densidad sea.  Si tiene 65aos o ms, o si est en riesgo de sufrir osteoporosis y fracturas, pregunte a su mdico si debe someterse a exmenes.  Consulte a su mdico si debe tomar un suplemento de calcio o de vitamina D para reducir el riesgo de osteoporosis.  La menopausia puede presentar ciertos sntomas fsicos y rGaffer  La terapia de reemplazo hormonal puede reducir algunos de estos sntomas y rGaffer Consulte a su mdico para saber si la terapia de reemplazo hormonal es conveniente para usted. INSTRUCCIONES PARA EL CUIDADO EN EL HOGAR  Realcese los estudios de rutina de la salud, dentales y de lPublic librarian  MBath  No consuma ningn producto que contenga tabaco, lo que incluye cigarrillos, tabaco de mHigher education careers advisero cPsychologist, sport and exercise  Si est embarazada, no beba alcohol.  Si est amamantando, reduzca el consumo de alcohol y la frecuencia con la que consume.  Si es mujer y no est embarazada limite el consumo de alcohol a no ms de 1 medida por da. Una medida equivale a 12onzas de cerveza, 5onzas de vino o 1onzas de bebidas alcohlicas de alta graduacin.  No consuma drogas.  No comparta agujas.  Solicite ayuda a su mdico si necesita apoyo o informacin para abandonar las drogas.  Informe a su mdico si a menudo se siente deprimido.  Notifique a su mdico si alguna vez ha sido vctima de abuso o si no se siente seguro en su hogar. Esta informacin no tiene cMarine scientistel consejo del mdico. Asegrese de hacerle al mdico cualquier pregunta que tenga. Document Released: 08/12/2011 Document Revised: 09/13/2014 Document Reviewed:  05/27/2015 Elsevier Interactive Patient Education  2[employer]

## 2018-02-08 ENCOUNTER — Ambulatory Visit: Payer: Self-pay | Attending: Family Medicine

## 2018-02-14 MED FILL — SERTRALINE HCL 50 MG TABS: 50 | 30 days supply | Qty: 27 | Fill #2

## 2018-02-14 MED FILL — TRI-LINYAH TABLET: 0.18/0.215/ | 28 days supply | Qty: 28 | Fill #11

## 2018-02-27 ENCOUNTER — Ambulatory Visit: Payer: Self-pay | Attending: Nurse Practitioner | Admitting: Nurse Practitioner

## 2018-02-27 ENCOUNTER — Encounter: Payer: Self-pay | Admitting: Nurse Practitioner

## 2018-02-27 VITALS — BP 126/87 | HR 77 | Temp 98.7°F | Ht <= 58 in | Wt 172.6 lb

## 2018-02-27 DIAGNOSIS — F419 Anxiety disorder, unspecified: Secondary | ICD-10-CM

## 2018-02-27 DIAGNOSIS — Z79899 Other long term (current) drug therapy: Secondary | ICD-10-CM | POA: Insufficient documentation

## 2018-02-27 DIAGNOSIS — F329 Major depressive disorder, single episode, unspecified: Secondary | ICD-10-CM

## 2018-02-27 DIAGNOSIS — Z7951 Long term (current) use of inhaled steroids: Secondary | ICD-10-CM | POA: Insufficient documentation

## 2018-02-27 DIAGNOSIS — Z308 Encounter for other contraceptive management: Secondary | ICD-10-CM

## 2018-02-27 MED ORDER — BUSPIRONE HCL 10 MG PO TABS: 10.0000 mg | ORAL_TABLET | Freq: Three times a day (TID) | ORAL | 1 refills | Status: DC

## 2018-02-27 MED ORDER — SERTRALINE HCL 50 MG PO TABS: 50.0000 mg | ORAL_TABLET | Freq: Every day | ORAL | 1 refills | Status: DC

## 2018-02-27 MED ORDER — DESOGESTREL-ETHINYL ESTRADIOL 0.15-30 MG-MCG PO TABS: 1.0000 | ORAL_TABLET | Freq: Every day | ORAL | 11 refills | Status: DC

## 2018-02-27 MED FILL — busPIRone HCL 10 MG TABS: 10 | 20 days supply | Qty: 60 | Fill #0

## 2018-02-27 NOTE — Patient Instructions (Signed)
Trastorno de ansiedad generalizada, en adultos  Generalized Anxiety Disorder, Adult  El trastorno de ansiedad generalizada (TAG) es un trastorno de salud mental. Las personas con esta afeccin se preocupan constantemente por los eventos de todos los das. A diferencia de la ansiedad normal, la preocupacin relacionada con el TAG no se produce por un evento especfico. Estas preocupaciones tampoco desaparecen ni mejoran con el tiempo. EL TAG interfiere con las funciones de la vida, incluidas las relaciones, el trabajo y la escuela.  El TAG puede variar de leve a grave. Las personas con TAG grave tienen intensas oleadas de ansiedad con sntomas fsicos (crisis de angustia).  Cules son las causas?  Se desconoce la causa exacta del TAG.  Qu incrementa el riesgo?  Es ms probable que esta afeccin se manifieste en:   Mujeres.   Las personas que tienen antecedentes familiares de trastornos de ansiedad.   Las personas que son muy tmidas.   Las personas que experimentan eventos muy estresantes en la vida, tales como la muerte de un ser querido.   Las personas que tienen un entorno familiar muy estresante.    Cules son los signos o los sntomas?  Con frecuencia, las personas que padecen el TAG se preocupan excesivamente por muchas cosas en la vida, tales como su salud y su familia. Tambin pueden experimentar una preocupacin desmedida por lo siguiente:   Hacer las cosas bien.   Llegar a tiempo.   Los desastres naturales.   Las amistades.    Los sntomas fsicos del TAG incluyen:   Fatiga.   Tensin muscular o contracciones musculares.   Temblores o agitacin.   Sobresaltarse con facilidad.   Sentir que el corazn late fuerte o est acelerado.   Sentir falta de aire o como que no se puede respirar profundamente.   Problemas para quedarse dormido o para seguir durmiendo.   Sudoracin.   Nuseas, diarrea o sndrome del intestino irritable (SII).   Dolores de cabeza.   Dificultad para concentrarse o  recordar hechos.   Agitacin.   Irritabilidad.    Cmo se diagnostica?  El mdico puede diagnosticar el TAG en funcin de los sntomas y los antecedentes mdicos. Se le realizar un examen fsico. El mdico le har preguntas especficas sobre sus sntomas, que incluyen qu tan graves son, cundo comenzaron y si van y vienen. Tambin puede hacerle preguntas sobre el consumo de alcohol o drogas, incluidos los medicamentos recetados. Su mdico podr derivarlo a un especialista de salud mental para ms evaluaciones.  Su mdico le realizar un examen exhaustivo y puede hacer pruebas adicionales para descartar otras posibles causas de sus sntomas.  Para recibir un diagnstico del TAG, una persona debe tener ansiedad que:   Est fuera de su control.   Afecte distintos aspectos de su vida, como el trabajo y las relaciones.   Cause angustia que le impida participar en sus actividades habituales.   Incluya al menos tres sntomas fsicos del TAG tales como inquietud, fatiga, dificultad para concentrarse, irritabilidad, tensin muscular o problemas para dormir.    Antes de que su mdico pueda confirmar un diagnstico del TAG, estos sntomas deben estar presentes ms das de los que no lo estn y deben tener una duracin de seis meses o ms.  Cmo se trata?  Las siguientes terapias se usan generalmente para tratar el TAG:   Medicamentos. Por lo general, los medicamentos antidepresivos se recetan para un control diario a largo plazo. Se pueden agregar medicamentos para la ansiedad en casos   graves, especialmente cuando ocurren crisis de angustia.   Psicoterapia (psicoanlisis). Determinados tipos de psicoterapia pueden ser tiles para tratar el TAG al brindar apoyo, educacin y orientacin. Entre las opciones se incluyen las siguientes:  ? Terapia cognitivo conductual (TCC). Las personas aprenden habilidades para sobrellevar situaciones y tcnicas para aliviar la ansiedad. Aprenden a identificar conductas y pensamientos  irreales o negativos, y a reemplazarlos por positivos.  ? Terapia de aceptacin y compromiso (acceptance and commitment therapy, ACT). Este tratamiento ensea a las personas a ser conscientes como una forma de lidiar con pensamientos y sentimientos no deseados.  ? Biorretroalimentacin. Este proceso lo capacita para controlar la respuesta del cuerpo (respuesta psicolgica) a travs de tcnicas de respiracin y mtodos de relajacin. Usted trabajar con un terapeuta mientras se usan mquinas para controlar sus sntomas fsicos.   Tcnicas para controlar el estrs. Estas incluyen yoga, meditacin y ejercicio.    Un especialista en salud mental puede ayudar a determinar qu tratamiento es el mejor para usted. Algunas personas pueden mejorar con solo un tipo de terapia. Sin embargo, otras personas requieren una combinacin de terapias.  Siga estas instrucciones en su casa:   Tome los medicamentos de venta libre y los recetados solamente como se lo haya indicado el mdico.   Trate de mantener una rutina normal.   Trate de anticipar situaciones estresantes y permita un tiempo adicional para controlarlas.   Participe de cualquier tcnica para autocalmarse o controlar el estrs segn las indicaciones de su mdico.   No se castigue ante los retrocesos o por no realizar progresos.   Trate de reconocer sus logros aunque sean pequeos.   Concurra a todas las visitas de seguimiento como se lo haya indicado el mdico. Esto es importante.  Comunquese con un mdico si:   Los sntomas no mejoran.   Los sntomas empeoran.   Tiene signos de depresin tales como:  ? Tristeza, mal humor o irritabilidad.  ? Ya no disfruta de actividades que le solan causar placer.  ? Cambios en el peso y o en sus hbitos de alimentacin.  ? Cambios en los hbitos de sueo.  ? Evita a amigos y familiares.  ? No tiene energa para realizar las tareas habituales.  ? Tiene sentimientos de culpa o de minusvala.  Solicite ayuda de inmediato  si:   Tiene pensamientos serios acerca de lastimarse a usted mismo o daar a otras personas.  Si alguna vez siente que puede lastimarse o lastimar a los dems, o piensa en poner fin a su vida, busque ayuda de inmediato. Puede dirigirse al servicio de urgencias ms cercano o comunicarse con:   El servicio de emergencias de su localidad (911 en los [place]).   Una lnea de asistencia al suicida y atencin en crisis, como la Lnea Nacional de Prevencin del Suicidio (National Suicide Prevention Lifeline) al 1-800-273-8255. Est disponible las 24 horas del da.    Resumen   El trastorno de ansiedad generalizada (TAG) es un trastorno de salud mental que implica preocupacin no causada por un evento especfico.   Con frecuencia, las personas que padecen el TAG se preocupan excesivamente por muchas cosas en la vida, tales como su salud y su familia.   El TAG puede causar sntomas fsicos tales como inquietud, dificultad para concentrarse, problemas para dormir, sudoracin frecuente, nuseas, diarrea, dolores de cabeza y temblores, o contracciones musculares.   Un especialista en salud mental puede ayudar a determinar qu tratamiento es el mejor para usted. Algunas personas pueden mejorar   con solo un tipo de terapia. Sin embargo, otras personas requieren una combinacin de terapias.  Esta informacin no tiene como fin reemplazar el consejo del mdico. Asegrese de hacerle al mdico cualquier pregunta que tenga.  Document Released: 12/18/2012 Document Revised: 11/26/2016 Document Reviewed: 11/26/2016  Elsevier Interactive Patient Education  2018 Elsevier Inc.

## 2018-02-27 NOTE — Progress Notes (Signed)
Assessment & Plan:  Amber Travis was seen today for establish care.  Diagnoses and all orders for this visit:  Anxiety and depression -     busPIRone (BUSPAR) 10 MG tablet; Take 1 tablet (10 mg total) by mouth 3 (three) times daily. -     sertraline (ZOLOFT) 50 MG tablet; Take 1 tablet (50 mg total) by mouth daily. Take medications as prescribed. Do not skip or stop medications until speaking with your provider.    Encounter for other contraceptive management -     desogestrel-ethinyl estradiol (APRI) 0.15-30 MG-MCG tablet; Take 1 tablet by mouth daily.    Patient has been counseled on age-appropriate routine health concerns for screening and prevention. These are reviewed and up-to-date. Referrals have been placed accordingly. Immunizations are up-to-date or declined.    Subjective:   Chief Complaint  Patient presents with  . Establish Care    Pt. is here to establish care for depression. Pt. stated she stop taking her birth control because she do not have the feeling to have sex with her husband.    HPI Amber Travis 37 y.o. female presents to office today to establish care. She has a history of anxiety and depression. She is requesting a refill of her OCP however it is not available at the onsite clinic. Will print prescription for her to take to pharmacy of her choice.   Anxiety and Depression She was started on zoloft by GYN 12-19-2017. She is here for follow up today. She endorses medication compliance and PHQ 9 score is lower today. She is concerned about weight gain since starting zoloft. She states she feels zoloft is helping her depression but she still experiences anxiety which causes her to overeat. Will start buspar today however I have instructed her to begin an exercise regimen daily which will also help to decrease her anxiety symptoms.   Depression screen PHQ 2/9 02/27/2018  Decreased Interest 1  Down, Depressed, Hopeless 1  PHQ - 2 Score 2  Altered sleeping 3    Tired, decreased energy 1  Change in appetite 3  Feeling bad or failure about yourself  1  Trouble concentrating 1  Moving slowly or fidgety/restless 2  Suicidal thoughts 0  PHQ-9 Score 13     Review of Systems  Constitutional: Negative for fever, malaise/fatigue and weight loss.  HENT: Negative.  Negative for nosebleeds.   Eyes: Negative.  Negative for blurred vision, double vision and photophobia.  Respiratory: Negative.  Negative for cough and shortness of breath.   Cardiovascular: Negative.  Negative for chest pain, palpitations and leg swelling.  Gastrointestinal: Negative.  Negative for heartburn, nausea and vomiting.  Musculoskeletal: Negative.  Negative for myalgias.  Neurological: Negative.  Negative for dizziness, focal weakness, seizures and headaches.  Psychiatric/Behavioral: Positive for depression. Negative for suicidal ideas. The patient is nervous/anxious.     Past Medical History:  Diagnosis Date  . Depression   . No pertinent past medical history     Past Surgical History:  Procedure Laterality Date  . APPENDECTOMY    . LAPAROSCOPIC APPENDECTOMY N/A 05/10/2016   Procedure: APPENDECTOMY LAPAROSCOPIC;  Surgeon: Jimmye Norman, MD;  Location: [facility] OR;  Service: General;  Laterality: N/A;    Family History  Problem Relation Age of Onset  . Diabetes Mother     Social History Reviewed with no changes to be made today.   Outpatient Medications Prior to Visit  Medication Sig Dispense Refill  . fluticasone (FLONASE) 50 MCG/ACT nasal spray  Place 2 sprays into both nostrils daily. 16 g 0  . ibuprofen (ADVIL,MOTRIN) 600 MG tablet Take 1 tablet (600 mg total) by mouth 3 (three) times daily. X 5 days then prn pain 60 tablet 0  . methocarbamol (ROBAXIN) 500 MG tablet Take 1 tablet (500 mg total) by mouth 4 (four) times daily. X 5 days then Prn muscle spasm 90 tablet 0  . sertraline (ZOLOFT) 50 MG tablet Take 1 tablet (50 mg total) by mouth daily. Take 1/2 tablet qd x 7  days and then increase to 1 whole tablet qd 45 tablet 2  . desogestrel-ethinyl estradiol (APRI) 0.15-30 MG-MCG tablet Take 1 tablet by mouth daily. (Patient not taking: Reported on 02/27/2018) 1 Package 11   No facility-administered medications prior to visit.     No Known Allergies     Objective:    BP 126/87 (BP Location: Right Arm, Patient Position: Sitting, Cuff Size: Normal)   Pulse 77   Temp 98.7 F (37.1 C) (Oral)   Ht 4\' 5"  (1.346 m)   Wt 172 lb 9.6 oz (78.3 kg)   LMP 02/13/2018   SpO2 98%   BMI 43.20 kg/m  Wt Readings from Last 3 Encounters:  02/27/18 172 lb 9.6 oz (78.3 kg)  02/01/18 171 lb 9.6 oz (77.8 kg)  12/19/17 172 lb (78 kg)    Physical Exam  Constitutional: She is oriented to person, place, and time. She appears well-developed and well-nourished. She is cooperative.  HENT:  Head: Normocephalic and atraumatic.  Eyes: EOM are normal.  Neck: Normal range of motion.  Cardiovascular: Normal rate, regular rhythm and normal heart sounds. Exam reveals no gallop and no friction rub.  No murmur heard. Pulmonary/Chest: Effort normal and breath sounds normal. No tachypnea. No respiratory distress. She has no decreased breath sounds. She has no wheezes. She has no rhonchi. She has no rales. She exhibits no tenderness.  Abdominal: Bowel sounds are normal.  Musculoskeletal: Normal range of motion. She exhibits no edema.  Neurological: She is alert and oriented to person, place, and time. Coordination normal.  Skin: Skin is warm and dry.  Psychiatric: She has a normal mood and affect. Her speech is normal and behavior is normal. Judgment and thought content normal. Cognition and memory are normal.  Nursing note and vitals reviewed.        Patient has been counseled extensively about nutrition and exercise as well as the importance of adherence with medications and regular follow-up. The patient was given clear instructions to go to ER or return to medical center if  symptoms don't improve, worsen or new problems develop. The patient verbalized understanding.   Follow-up: Return for PAP SMEAR, Fasting labs.   Claiborne RiggZelda W Janai Brannigan, FNP-BC [facility], [place] [phone]   02/27/2018, 12:30 PM

## 2018-03-21 MED FILL — SERTRALINE HCL 50 MG TABS: 50 | 30 days supply | Qty: 27 | Fill #3

## 2018-04-03 ENCOUNTER — Encounter: Payer: Self-pay | Admitting: Nurse Practitioner

## 2018-04-03 ENCOUNTER — Ambulatory Visit: Payer: Self-pay | Attending: Nurse Practitioner | Admitting: Nurse Practitioner

## 2018-04-03 VITALS — Temp 98.4°F | Ht <= 58 in | Wt 173.6 lb

## 2018-04-03 DIAGNOSIS — F419 Anxiety disorder, unspecified: Secondary | ICD-10-CM | POA: Insufficient documentation

## 2018-04-03 DIAGNOSIS — Z308 Encounter for other contraceptive management: Secondary | ICD-10-CM

## 2018-04-03 DIAGNOSIS — R42 Dizziness and giddiness: Secondary | ICD-10-CM | POA: Insufficient documentation

## 2018-04-03 DIAGNOSIS — Z6841 Body Mass Index (BMI) 40.0 and over, adult: Secondary | ICD-10-CM | POA: Insufficient documentation

## 2018-04-03 DIAGNOSIS — Z9889 Other specified postprocedural states: Secondary | ICD-10-CM | POA: Insufficient documentation

## 2018-04-03 DIAGNOSIS — F329 Major depressive disorder, single episode, unspecified: Secondary | ICD-10-CM | POA: Insufficient documentation

## 2018-04-03 MED ORDER — FLUTICASONE PROPIONATE 50 MCG/ACT NA SUSP: 2.0000 | Freq: Every day | NASAL | 6 refills | Status: DC

## 2018-04-03 MED ORDER — CETIRIZINE HCL 10 MG PO TABS: 10.0000 mg | ORAL_TABLET | Freq: Every day | ORAL | 11 refills | Status: DC

## 2018-04-03 MED ORDER — SERTRALINE HCL 50 MG PO TABS: 50.0000 mg | ORAL_TABLET | Freq: Every day | ORAL | 1 refills | Status: DC

## 2018-04-03 MED ORDER — BUSPIRONE HCL 10 MG PO TABS: 10.0000 mg | ORAL_TABLET | Freq: Three times a day (TID) | ORAL | 1 refills | Status: DC

## 2018-04-03 MED ORDER — DESOGESTREL-ETHINYL ESTRADIOL 0.15-30 MG-MCG PO TABS: 1.0000 | ORAL_TABLET | Freq: Every day | ORAL | 11 refills | Status: DC

## 2018-04-03 MED FILL — FLUTICASONE PROP 50 MCG SPR: 50 | 30 days supply | Qty: 16 | Fill #0

## 2018-04-03 MED FILL — ?CETIRIZINE HCL 10 MG TABLE: 10 | 30 days supply | Qty: 30 | Fill #0

## 2018-04-03 MED FILL — busPIRone HCL 10 MG TABS: 10 | 20 days supply | Qty: 60 | Fill #0

## 2018-04-03 NOTE — Patient Instructions (Signed)
Mareos Dizziness Los mareos son un problema muy frecuente. Se trata de una sensacin de inestabilidad o desvanecimiento. Puede sentir que se va a desmayar. Los [employer] pueden provocarle una lesin si se tropieza o se cae. Las Engineer, manufacturing de todas las edades pueden sufrir Tree surgeon, Armed forces training and education officer es ms frecuente en los adultos [place]. Esta afeccin puede tener muchas causas, entre las que se pueden [employer], la deshidratacin y [place]. Siga estas indicaciones en su casa: Comida y bebida  Beba suficiente lquido como para mantener la orina clara o de color amarillo plido. Esto evita la deshidratacin. Trate de beber ms lquidos transparentes, como agua.  No beba alcohol.  Limite el consumo de cafena si el mdico se lo indica. Verifique los ingredientes y la informacin nutricional para saber si un alimento o una bebida contienen cafena.  Limite el consumo de sal (sodio) si el mdico se lo indica. Verifique los ingredientes y la informacin nutricional para saber si un alimento o una bebida contienen sodio. Actividad  Evite los movimientos rpidos. ? Levntese de las sillas con lentitud y apyese hasta sentirse bien. ? Por la maana, sintese primero a un lado de la cama. Cuando se sienta bien, pngase lentamente de pie mientras se sostiene de algo, hasta que sepa que ha logrado el equilibrio.  Mueva las piernas con frecuencia si debe estar de pie en un lugar durante mucho tiempo. Mientras est de pie, contraiga y relaje los msculos de las piernas.  No conduzca vehculos ni opere maquinaria pesada si se siente mareado.  Evite agacharse si se siente mareado. En su casa, coloque los objetos de modo que le resulte fcil alcanzarlos sin Office manager. Estilo de vida  No consuma ningn producto que contenga nicotina o tabaco, como cigarrillos y Psychologist, sport and exercise. Si necesita ayuda para dejar de fumar, consulte al [employer].  Trate de reducir el nivel de estrs con mtodos como  el yoga o la meditacin. Hable con el mdico si necesita ayuda para controlar el nivel de estrs. Instrucciones generales  Controle sus mareos para ver si hay cambios.  Tome los medicamentos de venta libre y los recetados solamente como se lo haya indicado el mdico. Hable con el mdico si cree que los medicamentos que est tomando son la causa de sus mareos.  Infrmele a un amigo o a un familiar si se siente mareado. Pdale a esta persona que llame al mdico si observa cambios en su comportamiento.  Concurra a todas las visitas de seguimiento como se lo haya indicado el mdico. Esto es importante. Comunquese con un mdico si:  Los [employer].  Los [employer] o la sensacin de Engineer, petroleum.  Siente nuseas.  Se le redujo la audicin.  Aparecen nuevos sntomas.  Cuando est de pie, se siente inestable o que la habitacin da vueltas. Solicite ayuda de inmediato si:  Vomita o tiene diarrea y no puede comer ni beber nada.  Tiene dificultad para hablar, caminar, tragar o [employer], las [place].  Se siente usualmente dbil.  No piensa con claridad o tiene dificultad para armar oraciones. Es posible que un amigo o un familiar adviertan que esto ocurre.  Tiene dolor de pecho, dolor abdominal, sudoracin o Risk manager.  Sufre cambios en la visin.  Tiene cualquier tipo de sangrado.  Tiene dolor de cabeza intenso.  Tiene dolor o rigidez en el cuello.  Tiene fiebre. Estos sntomas pueden representar un problema grave que constituye Engineer, maintenance (IT). No espere hasta que los  Tiene cualquier tipo de sangrado.   Tiene dolor de cabeza intenso.   Tiene dolor o rigidez en el cuello.   Tiene fiebre.  Estos sntomas pueden representar un problema grave que constituye una emergencia. No espere hasta que los sntomas desaparezcan. Solicite atencin mdica de inmediato. Comunquese con el servicio de emergencias de su localidad (911 en los [place]). No conduzca por sus propios medios hasta el hospital.  Resumen   Los mareos son una sensacin de inestabilidad o desvanecimiento. Esta afeccin puede tener muchas causas, entre las que se pueden mencionar los medicamentos, la  deshidratacin y las enfermedades.   Las personas de todas las edades pueden sufrir mareos, pero es ms frecuente en los adultos mayores.   Beba suficiente lquido como para mantener la orina clara o de color amarillo plido. No beba alcohol.   Evite los movimientos rpidos si se siente mareado. Controle sus mareos para ver si hay cambios.  Esta informacin no tiene como fin reemplazar el consejo del mdico. Asegrese de hacerle al mdico cualquier pregunta que tenga.  Document Released: 08/23/2005 Document Revised: 12/10/2016 Document Reviewed: 12/10/2016  Elsevier Interactive Patient Education  2018 Elsevier Inc.

## 2018-04-03 NOTE — Progress Notes (Signed)
Assessment & Plan:  Amber Travis was seen today for dizziness.  Diagnoses and all orders for this visit:  Dizziness -     cetirizine (ZYRTEC) 10 MG tablet; Take 1 tablet (10 mg total) by mouth daily. -     fluticasone (FLONASE) 50 MCG/ACT nasal spray; Place 2 sprays into both nostrils daily.  Morbid obesity with BMI of 40.0-44.9, adult (HCC) -     CMP14+EGFR -     Lipid panel -     CBC Discussed diet and exercise for person with BMI >43. Instructed: You must burn more calories than you eat. Losing 5 percent of your body weight should be considered a success. In the longer term, losing more than 15 percent of your body weight and staying at this weight is an extremely good result. However, keep in mind that even losing 5 percent of your body weight leads to important health benefits, so try not to get discouraged if you're not able to lose more than this. Will recheck weight in 3-6 months.  Anxiety and depression -     sertraline (ZOLOFT) 50 MG tablet; Take 1 tablet (50 mg total) by mouth daily. -     busPIRone (BUSPAR) 10 MG tablet; Take 1 tablet (10 mg total) by mouth 3 (three) times daily. Do not abruptly stop medications.  Depression screen PHQ 2/9 04/03/2018  Decreased Interest 1  Down, Depressed, Hopeless 1  PHQ - 2 Score 2  Altered sleeping 3  Tired, decreased energy 3  Change in appetite 3  Feeling bad or failure about yourself  1  Trouble concentrating 1  Moving slowly or fidgety/restless 2  Suicidal thoughts 0  PHQ-9 Score 15   Encounter for other contraceptive management -     desogestrel-ethinyl estradiol (APRI) 0.15-30 MG-MCG tablet; Take 1 tablet by mouth daily.    Patient has been counseled on age-appropriate routine health concerns for screening and prevention. These are reviewed and up-to-date. Referrals have been placed accordingly. Immunizations are up-to-date or declined.    Subjective:   Chief Complaint  Patient presents with  . Dizziness    Patient is  here for dizziness. Patient stated she is fasting and would like to check her cholesterol.    HPI KAMYIAH Travis 37 y.o. female presents to office today with complaints of dizziness. VRI was used to communicate directly with patient for the entire encounter including providing detailed patient instructions.    Vertigo - Dizziness Patient presents with dizziness .  The dizziness has been present for 1 week. The patient describes the symptoms as vertigo. Symptoms are exacerbated by rapid head movements and motion. The patient also complains of depression. Patient denies aural pressure otalgia otorrhea tinnitus hearing loss. Dizziness lasts for only a few seconds.  She has been treated with nothing. Her last appointment with an eye specialist was 6 months ago. No problems with eyes. She stopped taking all her medications as she felt the medications were causing were dizziness. However she started these medications over 1 month ago.     Review of Systems  Constitutional: Negative for fever, malaise/fatigue and weight loss.  HENT: Negative.  Negative for nosebleeds.   Eyes: Negative.  Negative for blurred vision, double vision and photophobia.  Respiratory: Negative.  Negative for cough and shortness of breath.   Cardiovascular: Negative.  Negative for chest pain, palpitations and leg swelling.  Gastrointestinal: Negative.  Negative for heartburn, nausea and vomiting.  Musculoskeletal: Negative.  Negative for myalgias.  Neurological: Positive  for dizziness. Negative for focal weakness, seizures and headaches.  Psychiatric/Behavioral: Positive for depression. Negative for suicidal ideas. The patient is nervous/anxious.     Past Medical History:  Diagnosis Date  . Depression   . No pertinent past medical history     Past Surgical History:  Procedure Laterality Date  . APPENDECTOMY    . LAPAROSCOPIC APPENDECTOMY N/A 05/10/2016   Procedure: APPENDECTOMY LAPAROSCOPIC;  Surgeon: Judeth Horn,  MD;  Location: [facility] OR;  Service: General;  Laterality: N/A;    Family History  Problem Relation Age of Onset  . Diabetes Mother     Social History Reviewed with no changes to be made today.   Outpatient Medications Prior to Visit  Medication Sig Dispense Refill  . ibuprofen (ADVIL,MOTRIN) 600 MG tablet Take 1 tablet (600 mg total) by mouth 3 (three) times daily. X 5 days then prn pain (Patient not taking: Reported on 04/03/2018) 60 tablet 0  . busPIRone (BUSPAR) 10 MG tablet Take 1 tablet (10 mg total) by mouth 3 (three) times daily. (Patient not taking: Reported on 04/03/2018) 60 tablet 1  . desogestrel-ethinyl estradiol (APRI) 0.15-30 MG-MCG tablet Take 1 tablet by mouth daily. (Patient not taking: Reported on 04/03/2018) 1 Package 11  . sertraline (ZOLOFT) 50 MG tablet Take 1 tablet (50 mg total) by mouth daily. (Patient not taking: Reported on 04/03/2018) 90 tablet 1   No facility-administered medications prior to visit.     No Known Allergies     Objective:    Temp 98.4 F (36.9 C) (Oral)   Ht 4' 5" (1.346 m)   Wt 173 lb 9.6 oz (78.7 kg)   SpO2 98%   BMI 43.45 kg/m  Wt Readings from Last 3 Encounters:  04/03/18 173 lb 9.6 oz (78.7 kg)  02/27/18 172 lb 9.6 oz (78.3 kg)  02/01/18 171 lb 9.6 oz (77.8 kg)    Physical Exam  Constitutional: She is oriented to person, place, and time. She appears well-developed and well-nourished. She is cooperative.  HENT:  Head: Normocephalic and atraumatic.  Right Ear: A middle ear effusion is present.  Left Ear: A middle ear effusion is present.  Nose: Mucosal edema and rhinorrhea present.  Mouth/Throat: Uvula is midline.  Eyes: Pupils are equal, round, and reactive to light. Conjunctivae, EOM and lids are normal.  Neck: Normal range of motion.  Cardiovascular: Normal rate, regular rhythm, normal heart sounds and intact distal pulses. Exam reveals no gallop and no friction rub.  No murmur heard. Pulmonary/Chest: Effort normal and breath  sounds normal. No tachypnea. No respiratory distress. She has no decreased breath sounds. She has no wheezes. She has no rhonchi. She has no rales. She exhibits no tenderness.  Abdominal: Soft. Bowel sounds are normal.  Musculoskeletal: Normal range of motion. She exhibits no edema.  Neurological: She is alert and oriented to person, place, and time. She has normal strength. No sensory deficit. She displays a negative Romberg sign. Coordination and gait normal.  Skin: Skin is warm and dry.  Psychiatric: She has a normal mood and affect. Her behavior is normal. Judgment and thought content normal. She expresses no suicidal plans and no homicidal plans.  Nursing note and vitals reviewed.      Patient has been counseled extensively about nutrition and exercise as well as the importance of adherence with medications and regular follow-up. The patient was given clear instructions to go to ER or return to medical center if symptoms don't improve, worsen or new problems develop.  The patient verbalized understanding.   Follow-up: Return in about 1 month (around 05/01/2018) for f/u dizziness .   Gildardo Pounds, FNP-BC [facility], [place]   04/03/2018, 9:16 AM

## 2018-04-04 LAB — CBC
Hematocrit: 41.7 % (ref 34.0–46.6)
Hemoglobin: 14.2 g/dL (ref 11.1–15.9)
MCH: 31.4 pg (ref 26.6–33.0)
MCHC: 34.1 g/dL (ref 31.5–35.7)
MCV: 92 fL (ref 79–97)
PLATELETS: 265 10*3/uL (ref 150–450)
RBC: 4.52 x10E6/uL (ref 3.77–5.28)
RDW: 13.2 % (ref 12.3–15.4)
WBC: 7.1 10*3/uL (ref 3.4–10.8)

## 2018-04-04 LAB — CMP14+EGFR
A/G RATIO: 1.6 (ref 1.2–2.2)
ALT: 56 IU/L — ABNORMAL HIGH (ref 0–32)
AST: 31 IU/L (ref 0–40)
Albumin: 4.5 g/dL (ref 3.5–5.5)
Alkaline Phosphatase: 66 IU/L (ref 39–117)
BILIRUBIN TOTAL: 0.8 mg/dL (ref 0.0–1.2)
BUN/Creatinine Ratio: 17 (ref 9–23)
BUN: 11 mg/dL (ref 6–20)
CALCIUM: 9.7 mg/dL (ref 8.7–10.2)
CHLORIDE: 102 mmol/L (ref 96–106)
CO2: 23 mmol/L (ref 20–29)
Creatinine, Ser: 0.65 mg/dL (ref 0.57–1.00)
GFR, EST AFRICAN AMERICAN: 131 mL/min/{1.73_m2} (ref >59)
GFR, EST NON AFRICAN AMERICAN: 114 mL/min/{1.73_m2} (ref >59)
GLOBULIN, TOTAL: 2.8 g/dL (ref 1.5–4.5)
Glucose: 85 mg/dL (ref 65–99)
POTASSIUM: 4.4 mmol/L (ref 3.5–5.2)
SODIUM: 139 mmol/L (ref 134–144)
TOTAL PROTEIN: 7.3 g/dL (ref 6.0–8.5)

## 2018-04-04 LAB — LIPID PANEL
CHOL/HDL RATIO: 4.2 ratio (ref 0.0–4.4)
Cholesterol, Total: 198 mg/dL (ref 100–199)
HDL: 47 mg/dL (ref >39)
LDL CALC: 119 mg/dL — AB (ref 0–99)
Triglycerides: 158 mg/dL — ABNORMAL HIGH (ref 0–149)
VLDL CHOLESTEROL CAL: 32 mg/dL (ref 5–40)

## 2018-04-05 ENCOUNTER — Encounter: Payer: Self-pay | Admitting: Nurse Practitioner

## 2018-04-05 ENCOUNTER — Ambulatory Visit: Payer: Self-pay | Attending: Nurse Practitioner | Admitting: Nurse Practitioner

## 2018-04-05 VITALS — BP 115/82 | HR 73 | Temp 98.8°F | Ht <= 58 in | Wt 174.4 lb

## 2018-04-05 DIAGNOSIS — Z01419 Encounter for gynecological examination (general) (routine) without abnormal findings: Secondary | ICD-10-CM | POA: Insufficient documentation

## 2018-04-05 DIAGNOSIS — F329 Major depressive disorder, single episode, unspecified: Secondary | ICD-10-CM | POA: Insufficient documentation

## 2018-04-05 DIAGNOSIS — Z79899 Other long term (current) drug therapy: Secondary | ICD-10-CM | POA: Insufficient documentation

## 2018-04-05 NOTE — Patient Instructions (Signed)
Prueba de Papanicolaou  (Pap Test)  ¿POR QUÉ ME DEBO REALIZAR ESTA PRUEBA?  A esta prueba también se la denomina "frotis de Pap". Es una prueba de detección que se utiliza para detectar signos de cáncer de vagina, cuello del útero y útero. La prueba también puede identificar la presencia de infección o cambios precancerosos. El médico probablemente le recomiende que se realice esta prueba en forma regular. Esta prueba puede realizarse de la siguiente manera:  · Cada 3 años, a partir de los 21 años.  · Cada 5 años, en combinación con las pruebas que se realizan para detectar la presencia del virus del papiloma humano (VPH).  · Con mayor o menor frecuencia, en función de otras enfermedades que tenga.  ¿QUÉ TIPO DE MUESTRA SE TOMA?  El médico utilizará un pequeño hisopo de algodón, una espátula de plástico o un cepillo para recolectar una muestra de células de la superficie del cuello del útero. El cuello del útero es la apertura del útero, que también se conoce como matriz. También pueden recolectarse las secreciones del cuello del útero y la vagina.  ¿CÓMO DEBO PREPARARME PARA ESTA PRUEBA?  · Tenga en cuenta en qué etapa del ciclo menstrual se encuentra. Es posible que deba reprogramar la prueba si está menstruando el día en que debe realizársela.  · Si el día en que debe realizarse la prueba tiene una infección vaginal aparente, deberá reprogramar la prueba.  · Pueden pedirle que evite tomar una ducha o baño el día de la prueba o el día anterior.  · Algunos medicamentos pueden provocar resultados anormales de la prueba, como los digitálicos y la tetraciclina. Si toma alguno de estos medicamentos, hable con su médico antes de realizarse la prueba.  ¿QUÉ SIGNIFICAN LOS RESULTADOS?  Los resultados anormales de la prueba pueden indicar diversas enfermedades. Estas pueden incluir lo siguiente:  · Cáncer. Si bien los resultados de la prueba de Papanicolaou no pueden  utilizarse para diagnosticar cáncer de cuello del útero, de vagina o de útero, pueden indicar que existe una posibilidad de presencia de cáncer. En este caso, será necesario realizar pruebas adicionales para determinar la presencia de cáncer.  · Enfermedad de transmisión sexual.  · Infecciones por hongos.  · Infección por parásitos.  · Infección por herpes.  · Una enfermedad que causa o favorece la infertilidad.  Es su responsabilidad retirar el resultado del estudio. Consulte en el laboratorio o en el departamento en el que fue realizado el estudio cuándo y cómo podrá obtener los resultados. Comuníquese con el médico si tiene preguntas sobre los resultados.  Esta información no tiene como fin reemplazar el consejo del médico. Asegúrese de hacerle al médico cualquier pregunta que tenga.  Document Released: 02/09/2008 Document Revised: 09/13/2014 Document Reviewed: 01/14/2014  Elsevier Interactive Patient Education © 2018 Elsevier Inc.

## 2018-04-05 NOTE — Progress Notes (Signed)
Assessment & Plan:  Gardiner Travis was seen today for gynecologic exam.  Diagnoses and all orders for this visit:  Well woman exam with routine gynecological exam -     Cytology - PAP    Patient has been counseled on age-appropriate routine health concerns for screening and prevention. These are reviewed and up-to-date. Referrals have been placed accordingly. Immunizations are up-to-date or declined.    Subjective:   Chief Complaint  Patient presents with  . Gynecologic Exam    Patient is here for pap smear.    HPI Mort Amber Travis 37 y.o. female presents to office today for well woman exam with PAP.   Review of Systems  Constitutional: Negative.  Negative for chills, fever, malaise/fatigue and weight loss.  Respiratory: Negative.  Negative for cough, shortness of breath and wheezing.   Cardiovascular: Negative.  Negative for chest pain, orthopnea and leg swelling.  Gastrointestinal: Negative for abdominal pain.  Genitourinary: Negative.  Negative for flank pain.  Skin: Negative.  Negative for rash.  Psychiatric/Behavioral: Negative for suicidal ideas.    Past Medical History:  Diagnosis Date  . Depression   . No pertinent past medical history     Past Surgical History:  Procedure Laterality Date  . APPENDECTOMY    . LAPAROSCOPIC APPENDECTOMY N/A 05/10/2016   Procedure: APPENDECTOMY LAPAROSCOPIC;  Surgeon: Jimmye NormanJames Wyatt, MD;  [facility];  Service: General;  Laterality: N/A;    Family History  Problem Relation Age of Onset  . Diabetes Mother     Social History Reviewed with no changes to be made today.   Outpatient Medications Prior to Visit  Medication Sig Dispense Refill  . busPIRone (BUSPAR) 10 MG tablet Take 1 tablet (10 mg total) by mouth 3 (three) times daily. 60 tablet 1  . cetirizine (ZYRTEC) 10 MG tablet Take 1 tablet (10 mg total) by mouth daily. 30 tablet 11  . fluticasone (FLONASE) 50 MCG/ACT nasal spray Place 2 sprays into both nostrils daily. 16 g 6    . sertraline (ZOLOFT) 50 MG tablet Take 1 tablet (50 mg total) by mouth daily. 90 tablet 1  . desogestrel-ethinyl estradiol (APRI) 0.15-30 MG-MCG tablet Take 1 tablet by mouth daily. (Patient not taking: Reported on 04/05/2018) 1 Package 11  . ibuprofen (ADVIL,MOTRIN) 600 MG tablet Take 1 tablet (600 mg total) by mouth 3 (three) times daily. X 5 days then prn pain (Patient not taking: Reported on 04/03/2018) 60 tablet 0   No facility-administered medications prior to visit.     No Known Allergies     Objective:    Ht 4\' 5"  (1.346 m)   Wt 174 lb 6.4 oz (79.1 kg)   BMI 43.65 kg/m  Wt Readings from Last 3 Encounters:  04/05/18 174 lb 6.4 oz (79.1 kg)  04/03/18 173 lb 9.6 oz (78.7 kg)  02/27/18 172 lb 9.6 oz (78.3 kg)    Physical Exam  Constitutional: She is oriented to person, place, and time. She appears well-developed and well-nourished.  HENT:  Head: Normocephalic.  Cardiovascular: Normal rate, regular rhythm and normal heart sounds.  Pulmonary/Chest: Effort normal and breath sounds normal.  Abdominal: Soft. Bowel sounds are normal. Hernia confirmed negative in the right inguinal area and confirmed negative in the left inguinal area.  Genitourinary: Rectum normal and vagina normal. Rectal exam shows no external hemorrhoid. No labial fusion. There is no rash, tenderness, lesion or injury on the right labia. There is no rash, tenderness, lesion or injury on the left labia.  Uterus is deviated. Uterus is not enlarged. Cervix exhibits no motion tenderness and no friability. Right adnexum displays no mass, no tenderness and no fullness. Left adnexum displays no mass, no tenderness and no fullness. No erythema, tenderness or bleeding in the vagina. No foreign body in the vagina. No signs of injury around the vagina. No vaginal discharge found.  Lymphadenopathy: No inguinal adenopathy noted on the right or left side.       Right: No inguinal adenopathy present.       Left: No inguinal  adenopathy present.  Neurological: She is alert and oriented to person, place, and time.  Skin: Skin is warm and dry.  Psychiatric: She has a normal mood and affect. Her behavior is normal. Judgment and thought content normal.      Patient has been counseled extensively about nutrition and exercise as well as the importance of adherence with medications and regular follow-up. The patient was given clear instructions to go to ER or return to medical center if symptoms don't improve, worsen or new problems develop. The patient verbalized understanding.   Follow-up: Return in about 6 weeks (around 05/17/2018) for Dizziness.   Claiborne Rigg, FNP-BC [facility] and [facility] [place], [place] [phone]   04/05/2018, 8:49 AM

## 2018-04-07 ENCOUNTER — Other Ambulatory Visit: Payer: Self-pay | Admitting: Nurse Practitioner

## 2018-04-07 LAB — CYTOLOGY - PAP
BACTERIAL VAGINITIS: POSITIVE — AB
Candida vaginitis: NEGATIVE
Chlamydia: NEGATIVE
DIAGNOSIS: NEGATIVE
NEISSERIA GONORRHEA: NEGATIVE
TRICH (WINDOWPATH): NEGATIVE

## 2018-04-07 MED ORDER — METRONIDAZOLE 500 MG PO TABS: 500.0000 mg | ORAL_TABLET | Freq: Two times a day (BID) | ORAL | 0 refills | Status: AC

## 2018-04-10 MED FILL — metroNIDAZOLE 500 MG TABS: 500 | 7 days supply | Qty: 14 | Fill #0

## 2018-04-20 ENCOUNTER — Ambulatory Visit: Payer: Self-pay | Attending: Internal Medicine | Admitting: Internal Medicine

## 2018-04-20 ENCOUNTER — Encounter: Payer: Self-pay | Admitting: Internal Medicine

## 2018-04-20 VITALS — BP 117/88 | HR 76 | Temp 98.5°F | Resp 16 | Wt 174.0 lb

## 2018-04-20 DIAGNOSIS — M79621 Pain in right upper arm: Secondary | ICD-10-CM

## 2018-04-20 DIAGNOSIS — M79622 Pain in left upper arm: Secondary | ICD-10-CM

## 2018-04-20 DIAGNOSIS — F419 Anxiety disorder, unspecified: Secondary | ICD-10-CM

## 2018-04-20 DIAGNOSIS — R200 Anesthesia of skin: Secondary | ICD-10-CM

## 2018-04-20 DIAGNOSIS — F329 Major depressive disorder, single episode, unspecified: Secondary | ICD-10-CM

## 2018-04-20 DIAGNOSIS — F4322 Adjustment disorder with anxiety: Secondary | ICD-10-CM | POA: Insufficient documentation

## 2018-04-20 MED ORDER — SERTRALINE HCL 100 MG PO TABS: 100.0000 mg | ORAL_TABLET | Freq: Every day | ORAL | 0 refills | Status: DC

## 2018-04-20 MED FILL — SERTRALINE HCL 100 MG TAB: 100 | 30 days supply | Qty: 30 | Fill #0

## 2018-04-20 NOTE — Progress Notes (Signed)
Patient ID: Amber SawyersMarisol E Travis, female    DOB: 1981-06-17  MRN: [id number]  CC: arm numbness   Subjective: Amber HeadingMarisol Travis is a 37 y.o. female who presents for UC visit Her concerns today include:   Pt c/o pain and numbness in both arms x 3 days. -symptoms are intermittent and last seconds; occurs many times during the day.  -no neck pain. But on futher questioning, she endorses pain and tightness over LT shoulder.  -does cleaning and maintenance for living.  Uses both hands. Does some lifting.   No aggravating or relieving factors   Request increase in med for anxiety.  She is on Buspar and Zoloft.   Patient Active Problem List   Diagnosis Date Noted  . Pelvic pain 06/13/2017  . 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 Breast lump in female 02/04/2015  . DENTAL CARIES 10/07/2010  . HYPERBILIRUBINEMIA 07/09/2010  . SKIN RASH 05/14/2010  . ANXIETY DEPRESSION 05/04/2010  . FIBROCYSTIC BREAST DISEASE 05/04/2010  . UNSPECIFIED ADJUSTMENT REACTION 04/17/2009  . MIGRAINE HEADACHE 01/27/2009  . Depression 06/11/2008     Current Outpatient Medications on File Prior to Visit  Medication Sig Dispense Refill  . busPIRone (BUSPAR) 10 MG tablet Take 1 tablet (10 mg total) by mouth 3 (three) times daily. 60 tablet 1  . cetirizine (ZYRTEC) 10 MG tablet Take 1 tablet (10 mg total) by mouth daily. 30 tablet 11  . desogestrel-ethinyl estradiol (APRI) 0.15-30 MG-MCG tablet Take 1 tablet by mouth daily. (Patient not taking: Reported on 04/05/2018) 1 Package 11  . fluticasone (FLONASE) 50 MCG/ACT nasal spray Place 2 sprays into both nostrils daily. 16 g 6  . ibuprofen (ADVIL,MOTRIN) 600 MG tablet Take 1 tablet (600 mg total) by mouth 3 (three) times daily. X 5 days then prn pain (Patient not taking: Reported on 04/03/2018) 60 tablet 0   No current facility-administered medications on file prior to visit.     No Known Allergies  Social History   Socioeconomic History  . Marital  status: Single    Spouse name: Not on file  . Number of children: Not on file  . Years of education: Not on file  . Highest education level: Not on file  Occupational History  . Not on file  Social Needs  . Financial resource strain: Not on file  . Food insecurity:    Worry: Not on file    Inability: Not on file  . Transportation needs:    Medical: Not on file    Non-medical: Not on file  Tobacco Use  . Smoking status: Never Smoker  . Smokeless tobacco: Never Used  Substance and Sexual Activity  . Alcohol use: No  . Drug use: No  . Sexual activity: Not Currently    Birth control/protection: None, Pill  Lifestyle  . Physical activity:    Days per week: Not on file    Minutes per session: Not on file  . Stress: Not on file  Relationships  . Social connections:    Talks on phone: Not on file    Gets together: Not on file    Attends religious service: Not on file    Active member of club or organization: Not on file    Attends meetings of clubs or organizations: Not on file    Relationship status: Not on file  . Intimate partner violence:    Fear of current or ex partner: Not on file    Emotionally abused: Not on  file    Physically abused: Not on file    Forced sexual activity: Not on file  Other Topics Concern  . Not on file  Social History Narrative  . Not on file    Family History  Problem Relation Age of Onset  . Diabetes Mother     Past Surgical History:  Procedure Laterality Date  . APPENDECTOMY    . LAPAROSCOPIC APPENDECTOMY N/A 05/10/2016   Procedure: APPENDECTOMY LAPAROSCOPIC;  Surgeon: Jimmye NormanJames Wyatt, MD;  Location: [facility] OR;  Service: General;  Laterality: N/A;    ROS: Review of Systems Negative except as stated above PHYSICAL EXAM: BP 117/88   Pulse 76   Temp 98.5 F (36.9 C) (Oral)   Resp 16   Wt 174 lb (78.9 kg)   SpO2 97%   BMI 43.55 kg/m   Physical Exam  General appearance - alert, well appearing, and in no distress Mental status - normal  mood, behavior, speech, dress, motor activity, and thought processes Neck - supple, no significant adenopathy Neurological -grip 5/5 bilaterally.  Power upper extremities proximal and distal 5/5 bilaterally.  Gross sensation intact in the upper and lower extremities.   Musculoskeletal -neck is supple.  Mild tenderness on palpation of the left trapezius. Extremities: Radial and brachial pulses are 3+ bilaterally.   ASSESSMENT AND PLAN: 1. Numbness of arm 2. Pain in both upper arms - DG Cervical Spine Complete; Future Advised ibuprofen 600 mg every 8 hours as needed Keep her follow-up appointment with her primary provider next month  3. Anxiety and depression Zoloft increased from 50 to 100 mg daily - sertraline (ZOLOFT) 100 MG tablet; Take 1 tablet (100 mg total) by mouth daily.  Dispense: 90 tablet; Refill: 0  Patient was given the opportunity to ask questions.  Patient verbalized understanding of the plan and was able to repeat key elements of the plan.   Orders Placed This Encounter  Procedures  . DG Cervical Spine Complete     Requested Prescriptions   Signed Prescriptions Disp Refills  . sertraline (ZOLOFT) 100 MG tablet 90 tablet 0    Sig: Take 1 tablet (100 mg total) by mouth daily.    Return if symptoms worsen or fail to improve.  Jonah Blueeborah Johnson, MD, FACP

## 2018-04-21 ENCOUNTER — Ambulatory Visit (HOSPITAL_COMMUNITY)
Admission: RE | Admit: 2018-04-21 | Discharge: 2018-04-21 | Disposition: A | Discharge status: Home / Self Care | Payer: Self-pay | Source: Ambulatory Visit | Attending: Internal Medicine | Admitting: Internal Medicine

## 2018-04-21 DIAGNOSIS — M79621 Pain in right upper arm: Secondary | ICD-10-CM | POA: Insufficient documentation

## 2018-04-21 DIAGNOSIS — R200 Anesthesia of skin: Secondary | ICD-10-CM | POA: Insufficient documentation

## 2018-04-21 DIAGNOSIS — M79622 Pain in left upper arm: Secondary | ICD-10-CM | POA: Insufficient documentation

## 2018-04-21 IMAGING — DX DG CERVICAL SPINE COMPLETE 4+V
6 series · 6 of 6 positions shown · non-contrast
Comparison: None.

CLINICAL DATA: Cervicalgia with upper extremity radicular symptoms

EXAM:
CERVICAL SPINE - COMPLETE 4+ VIEW

[c-spine lat]
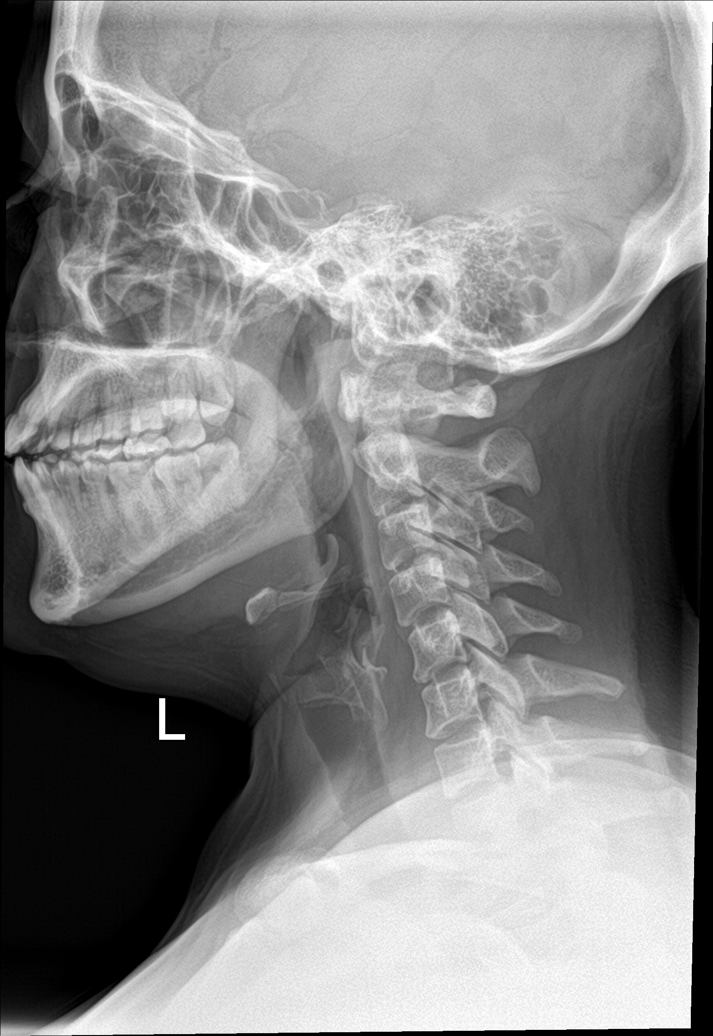

[c-spine obl (1 of 2)]
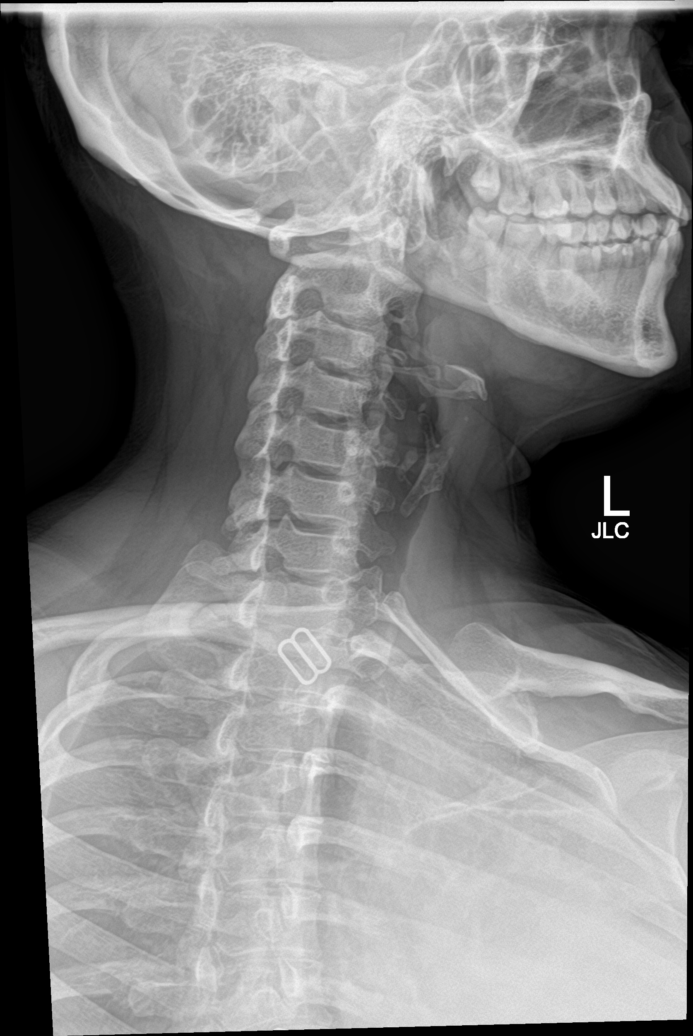

[c-spine obl (2 of 2)]
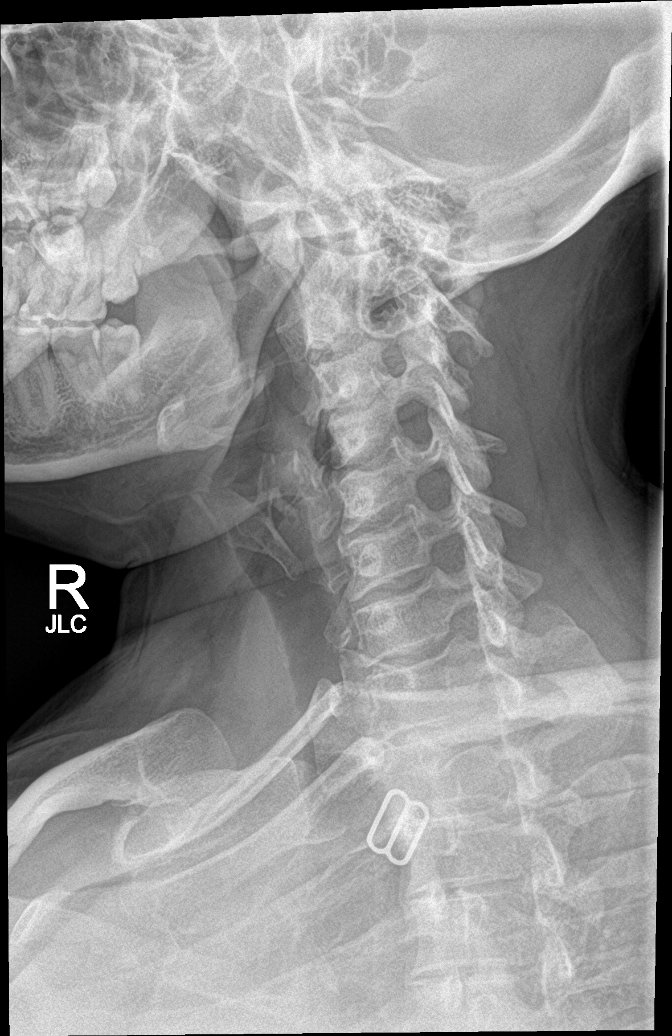

[c-spine ap]
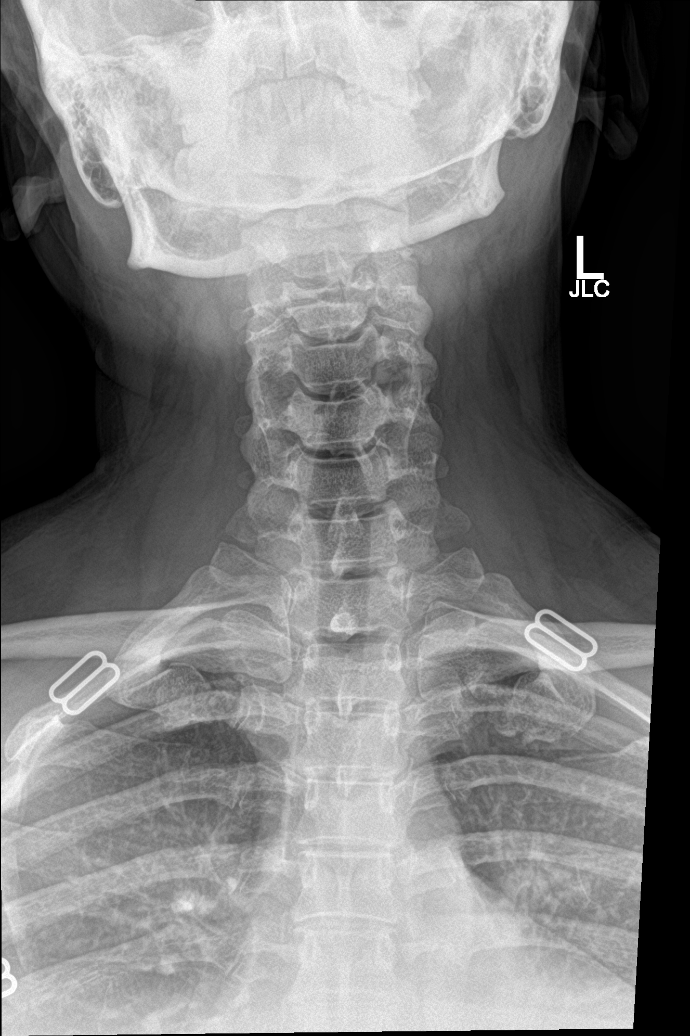

[c-spine open mouth]
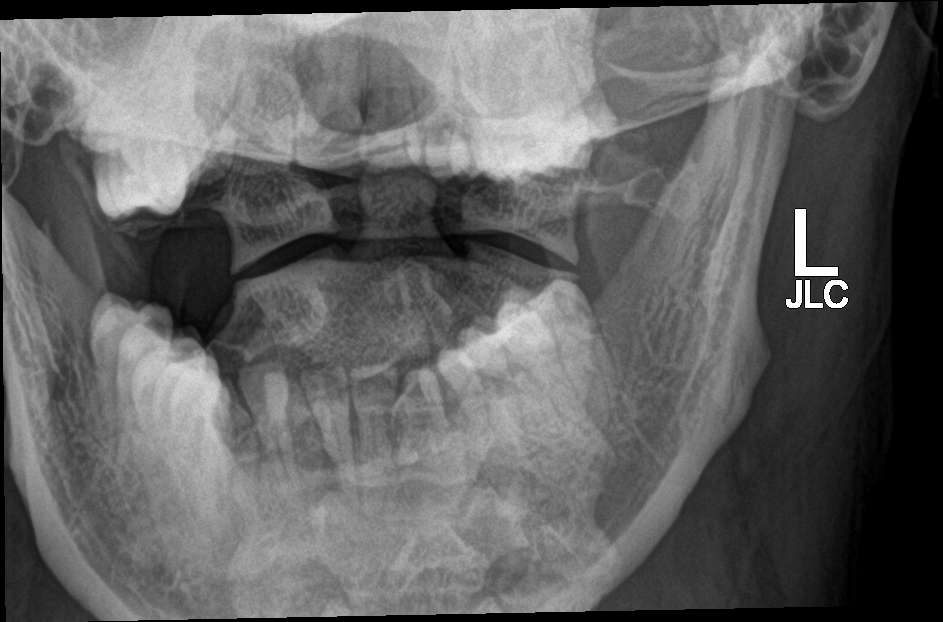

[[person_name]]
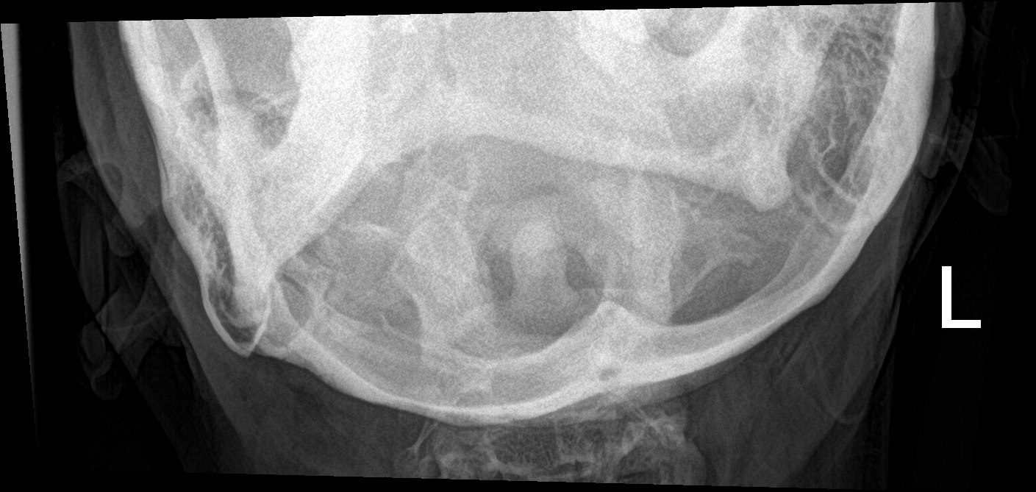

[6 of 6 positions shown; findings below may reference images not displayed]

FINDINGS: Frontal, lateral, open-mouth odontoid, and bilateral oblique views
were obtained. There is no evident fracture or spondylolisthesis.
Prevertebral soft tissues and predental space regions are normal.
Disc spaces appear normal. There is no appreciable exit foraminal
narrowing on the oblique views. There is reversal of lordotic
curvature.
IMPRESSION: Reversal of lordotic curvature, a finding most likely indicative of
muscle spasm. No fracture or spondylolisthesis. No appreciable
arthropathic change.

## 2018-05-19 ENCOUNTER — Ambulatory Visit: Payer: Self-pay | Attending: Nurse Practitioner | Admitting: Nurse Practitioner

## 2018-05-19 ENCOUNTER — Encounter: Payer: Self-pay | Admitting: Nurse Practitioner

## 2018-05-19 VITALS — BP 116/79 | HR 67 | Temp 98.4°F | Ht <= 58 in | Wt 174.4 lb

## 2018-05-19 DIAGNOSIS — F419 Anxiety disorder, unspecified: Secondary | ICD-10-CM | POA: Insufficient documentation

## 2018-05-19 DIAGNOSIS — M25512 Pain in left shoulder: Secondary | ICD-10-CM | POA: Insufficient documentation

## 2018-05-19 DIAGNOSIS — F329 Major depressive disorder, single episode, unspecified: Secondary | ICD-10-CM | POA: Insufficient documentation

## 2018-05-19 DIAGNOSIS — Z09 Encounter for follow-up examination after completed treatment for conditions other than malignant neoplasm: Secondary | ICD-10-CM | POA: Insufficient documentation

## 2018-05-19 DIAGNOSIS — Z791 Long term (current) use of non-steroidal anti-inflammatories (NSAID): Secondary | ICD-10-CM | POA: Insufficient documentation

## 2018-05-19 DIAGNOSIS — R200 Anesthesia of skin: Secondary | ICD-10-CM | POA: Insufficient documentation

## 2018-05-19 DIAGNOSIS — R6882 Decreased libido: Secondary | ICD-10-CM | POA: Insufficient documentation

## 2018-05-19 DIAGNOSIS — R42 Dizziness and giddiness: Secondary | ICD-10-CM | POA: Insufficient documentation

## 2018-05-19 DIAGNOSIS — M25511 Pain in right shoulder: Secondary | ICD-10-CM | POA: Insufficient documentation

## 2018-05-19 DIAGNOSIS — Z79899 Other long term (current) drug therapy: Secondary | ICD-10-CM | POA: Insufficient documentation

## 2018-05-19 MED ORDER — BUSPIRONE HCL 30 MG PO TABS: 30.0000 mg | ORAL_TABLET | Freq: Three times a day (TID) | ORAL | 2 refills | Status: DC

## 2018-05-19 MED ORDER — SERTRALINE HCL 100 MG PO TABS: 100.0000 mg | ORAL_TABLET | Freq: Every day | ORAL | 0 refills | Status: DC

## 2018-05-19 MED FILL — SERTRALINE HCL 100 MG TAB: 100 | 30 days supply | Qty: 30 | Fill #0

## 2018-05-19 MED FILL — busPIRone HCL 30 MG TABS: 30 | 90 days supply | Qty: 90 | Fill #0

## 2018-05-19 NOTE — Progress Notes (Signed)
Assessment & Plan:  Amber Travis was seen today for follow-up and numbness.  Diagnoses and all orders for this visit:  Numbness of fingers of both hands -     Ambulatory referral to Hand Surgery  Anxiety and depression -     busPIRone (BUSPAR) 30 MG tablet; Take 1 tablet (30 mg total) by mouth 3 (three) times daily. -     sertraline (ZOLOFT) 100 MG tablet; Take 1 tablet (100 mg total) by mouth daily.  Decreased libido She has an appointment with GYN in 3 days. Will defer at this time.   Patient has been counseled on age-appropriate routine health concerns for screening and prevention. These are reviewed and up-to-date. Referrals have been placed accordingly. Immunizations are up-to-date or declined.    Subjective:   Chief Complaint  Patient presents with  . Follow-up    Pt. is here to follow-up on dizziness.   . Numbness    Pt. stated she have numbness on both of her hands.    HPI Amber Travis 37 y.o. female presents to office today for follow up for bilateral hand numbness. VRI was used to communicate directly with patient for the entire encounter including providing detailed patient instructions.    Shoulder Pain She states pain radiates from bilateral shoulders down to her arms and hands. There is numbness in both hands. Aggravating factors: lying down at night. Relieving factors: none. Numbness is persistent. She had an xray of the cervical spine a few weeks ago which was negative. At this time I will refer her to the hand specialist/orthopedics for further evaluation as the pain persists.    Anxiety She states her anxiety is not controlled and she tends to eat more which is causing her to gain weight. She would like increase her Buspar. I have instructed her that even though she is eating more she needs to snack on healthy foods and not unhealthy foods to prevent weight gain. She does not eat vegetables. I have encouraged her to incorporate more fruit for snacking.    Decreased Libido She endorses decreased libido for several months. She states she simply has no desire to have sex nor does she want to get pregnant again and have another son. She has 4 currently. She states her husband thinks she is cheating on him since she does not want to have sex. I have instructed her that this is sometimes a side effect of SSRIs. However if she decides to stop taking her zoloft then her libido may still be affected due to untreated depression. She is requesting a medication to help increase her libido. As she has an appointment with GYN coming up I have instructed her to discuss this with GYN as I do not prescribe medications for libido.   Review of Systems  Constitutional: Negative for fever, malaise/fatigue and weight loss.  HENT: Negative.  Negative for nosebleeds.   Eyes: Negative.  Negative for blurred vision, double vision and photophobia.  Respiratory: Negative.  Negative for cough and shortness of breath.   Cardiovascular: Negative.  Negative for chest pain, palpitations and leg swelling.  Gastrointestinal: Negative.  Negative for heartburn, nausea and vomiting.  Musculoskeletal: Negative.  Negative for myalgias.  Neurological: Positive for tingling and sensory change. Negative for dizziness, focal weakness, seizures and headaches.  Psychiatric/Behavioral: Positive for depression. Negative for suicidal ideas. The patient is nervous/anxious.     Past Medical History:  Diagnosis Date  . Depression   . No pertinent past medical history  Past Surgical History:  Procedure Laterality Date  . APPENDECTOMY    . LAPAROSCOPIC APPENDECTOMY N/A 05/10/2016   Procedure: APPENDECTOMY LAPAROSCOPIC;  Surgeon: Jimmye Norman, MD;  [facility] OR;  Service: General;  Laterality: N/A;    Family History  Problem Relation Age of Onset  . Diabetes Mother     Social History Reviewed with no changes to be made today.   Outpatient Medications Prior to Visit  Medication  Sig Dispense Refill  . cetirizine (ZYRTEC) 10 MG tablet Take 1 tablet (10 mg total) by mouth daily. 30 tablet 11  . fluticasone (FLONASE) 50 MCG/ACT nasal spray Place 2 sprays into both nostrils daily. 16 g 6  . busPIRone (BUSPAR) 10 MG tablet Take 1 tablet (10 mg total) by mouth 3 (three) times daily. 60 tablet 1  . sertraline (ZOLOFT) 100 MG tablet Take 1 tablet (100 mg total) by mouth daily. 90 tablet 0  . desogestrel-ethinyl estradiol (APRI) 0.15-30 MG-MCG tablet Take 1 tablet by mouth daily. (Patient not taking: Reported on 04/05/2018) 1 Package 11  . ibuprofen (ADVIL,MOTRIN) 600 MG tablet Take 1 tablet (600 mg total) by mouth 3 (three) times daily. X 5 days then prn pain (Patient not taking: Reported on 04/03/2018) 60 tablet 0   No facility-administered medications prior to visit.     No Known Allergies     Objective:    BP 116/79 (BP Location: Right Arm, Patient Position: Sitting, Cuff Size: Large)   Pulse 67   Temp 98.4 F (36.9 C) (Oral)   Ht 4\' 5"  (1.346 m)   Wt 174 lb 6.4 oz (79.1 kg)   LMP 05/08/2018   SpO2 97%   BMI 43.65 kg/m  Wt Readings from Last 3 Encounters:  05/19/18 174 lb 6.4 oz (79.1 kg)  04/20/18 174 lb (78.9 kg)  04/05/18 174 lb 6.4 oz (79.1 kg)    Physical Exam  Constitutional: She is oriented to person, place, and time. She appears well-developed and well-nourished. She is cooperative.  HENT:  Head: Normocephalic and atraumatic.  Eyes: EOM are normal.  Neck: Normal range of motion.  Cardiovascular: Normal rate, regular rhythm, normal heart sounds and intact distal pulses. Exam reveals no gallop and no friction rub.  No murmur heard. Pulmonary/Chest: Effort normal and breath sounds normal. No tachypnea. No respiratory distress. She has no decreased breath sounds. She has no wheezes. She has no rhonchi. She has no rales. She exhibits no tenderness.  Abdominal: Soft. Bowel sounds are normal.  Musculoskeletal: Normal range of motion. She exhibits no  edema.       Right hand: She exhibits normal range of motion. Normal sensation noted. Decreased sensation is not present in the ulnar distribution, is not present in the medial distribution and is not present in the radial distribution. Normal strength noted. She exhibits no finger abduction, no thumb/finger opposition and no wrist extension trouble.       Left hand: She exhibits normal range of motion. Normal strength noted. She exhibits no finger abduction, no thumb/finger opposition and no wrist extension trouble.       Hands: She endorses increased numbness and tingling in bilateral 3rd fingers.  Neurological: She is alert and oriented to person, place, and time. She has normal strength. Coordination normal.  Skin: Skin is warm and dry.  Psychiatric: She has a normal mood and affect. Her speech is normal and behavior is normal. Judgment and thought content normal. Cognition and memory are normal. She expresses no homicidal and no  suicidal ideation. She expresses no suicidal plans and no homicidal plans.  Nursing note and vitals reviewed.      Patient has been counseled extensively about nutrition and exercise as well as the importance of adherence with medications and regular follow-up. The patient was given clear instructions to go to ER or return to medical center if symptoms don't improve, worsen or new problems develop. The patient verbalized understanding.   Follow-up: Return in about 3 months (around 08/18/2018).   Claiborne RiggZelda W Deidrick Rainey, FNP-BC [facility] and [facility] [place], [place] [phone]   05/19/2018, 9:13 AM

## 2018-05-19 NOTE — Patient Instructions (Signed)
Sndrome del tnel carpiano (Carpal Tunnel Syndrome) El sndrome del tnel carpiano es una afeccin que causa dolor en la mano y en el brazo. El tnel carpiano es un espacio estrecho ubicado en el lado palmar de la mueca. Los movimientos repetidos de la mueca o determinadas enfermedades pueden causar la hinchazn del tnel. Esta hinchazn puede comprimir el nervio principal de la mueca (nervio mediano). CUIDADOS EN EL HOGAR Si tiene una frula:  sela como se lo haya indicado el mdico. Qutesela solamente como se lo haya indicado el mdico.  Afloje la frula si los dedos: ? Se le adormecen y siente hormigueos. ? Se le ponen azulados y fros.  Mantenga la frula limpia y seca. Instrucciones generales  Tome los medicamentos de venta libre y los recetados solamente como se lo haya indicado el mdico.  Haga reposar la mueca de toda actividad que le cause dolor. Si es necesario, hable con su empleador sobre los cambios que pueden hacerse en su lugar de trabajo, por ejemplo, usar una almohadilla para apoyar la mueca mientras tipea.  Si se lo indican, aplique hielo sobre la zona dolorida: ? Ponga el hielo en una bolsa plstica. ? Coloque una toalla entre la piel y la bolsa de hielo. ? Coloque el hielo durante 20minutos, 2 a 3veces por da.  Concurra a todas las visitas de control como se lo haya indicado el mdico. Esto es importante.  Haga los ejercicios como se lo hayan indicado el mdico, el fisioterapeuta o el terapeuta ocupacional. SOLICITE AYUDA SI:  Aparecen nuevos sntomas.  Los medicamentos no le alivian el dolor.  Los sntomas empeoran. Esta informacin no tiene como fin reemplazar el consejo del mdico. Asegrese de hacerle al mdico cualquier pregunta que tenga. Document Released: 08/12/2011 Document Revised: 05/14/2015 Document Reviewed: 01/08/2015 Elsevier Interactive Patient Education  2018 Elsevier Inc.  

## 2018-05-22 ENCOUNTER — Ambulatory Visit (INDEPENDENT_AMBULATORY_CARE_PROVIDER_SITE_OTHER): Payer: Self-pay | Admitting: Obstetrics & Gynecology

## 2018-05-22 ENCOUNTER — Encounter: Payer: Self-pay | Admitting: Obstetrics & Gynecology

## 2018-05-22 ENCOUNTER — Encounter (HOSPITAL_COMMUNITY): Payer: Self-pay

## 2018-05-22 VITALS — BP 127/68 | HR 78 | Wt 175.7 lb

## 2018-05-22 DIAGNOSIS — Z3009 Encounter for other general counseling and advice on contraception: Secondary | ICD-10-CM

## 2018-05-22 NOTE — Progress Notes (Signed)
   Subjective:    Patient ID: Amber Travis, female    DOB: [birth year], 37 y.o.   MRN: [id number]  HPI  37 yo single P4 (all boys) here because she wants no more children. She wants permanent sterility.   Review of Systems She is abstinent, says zoloft decreasing her sex drive. She had a laparoscopic appendectomy.    Objective:   Physical Exam Breathing, conversing, and ambulating normally Well nourished, well hydrated Latina, no apparent distress  Live interpretor present for exam Abd- benign, scars from her appendectomy     Assessment & Plan:  Unwanted fertility- plan for laparoscopic application of Filsche clips She understands the failure rate of about 1 in 400 She understands the permanence of this procedure. For her decreased libido, she can discuss possibly changing to a different antidepressant. I showed her images of the Filsche clips.

## 2018-05-26 ENCOUNTER — Encounter (INDEPENDENT_AMBULATORY_CARE_PROVIDER_SITE_OTHER): Payer: Self-pay | Admitting: Orthopaedic Surgery

## 2018-05-26 ENCOUNTER — Ambulatory Visit (INDEPENDENT_AMBULATORY_CARE_PROVIDER_SITE_OTHER): Payer: Self-pay | Admitting: Orthopaedic Surgery

## 2018-05-26 DIAGNOSIS — M79642 Pain in left hand: Secondary | ICD-10-CM

## 2018-05-26 DIAGNOSIS — M79641 Pain in right hand: Secondary | ICD-10-CM

## 2018-05-26 NOTE — Progress Notes (Signed)
Office Visit Note   Patient: Amber SawyersMarisol E Garcia           Date of Birth: 1981-03-19           MRN: [id number] Visit Date: 05/26/2018              Requested by: Claiborne RiggFleming, Zelda W, NP [address] [place], [place] [id number] PCP: Claiborne RiggFleming, Zelda W, NP   Assessment & Plan: Visit Diagnoses:  1. Pain in both hands     Plan: Impression is numbness and tingling in pain in bilateral hands.  Recommend nerve conduction studies to assess for carpal tunnel syndrome.  She did have a cervical spine x-ray recently which was relatively unremarkable.  Today's encounter was performed through an interpreter which did increase the complexity.  Follow-Up Instructions: Return if symptoms worsen or fail to improve.   Orders:  Orders Placed This Encounter  Procedures  . Ambulatory referral to Physical Medicine Rehab   No orders of the defined types were placed in this encounter.     Procedures: No procedures performed   Clinical Data: No additional findings.   Subjective: Chief Complaint  Patient presents with  . Left Hand - Pain, Numbness  . Right Hand - Pain, Numbness    Gardiner RhymeMarisol is a pleasant 37 year old female comes in with bilateral hand pain and numbness and tingling.  She states that she has radiation of pain from her shoulders down to the hand.  She states that she is dropping things and the pain is worse at night.  Her pain is more so in the trapezius muscle.   Review of Systems  Constitutional: Negative.   HENT: Negative.   Eyes: Negative.   Respiratory: Negative.   Cardiovascular: Negative.   Endocrine: Negative.   Musculoskeletal: Negative.   Neurological: Negative.   Hematological: Negative.   Psychiatric/Behavioral: Negative.   All other systems reviewed and are negative.    Objective: Vital Signs: LMP 05/08/2018   Physical Exam  Constitutional: She is oriented to person, place, and time. She appears well-developed and well-nourished.  HENT:  Head:  Normocephalic and atraumatic.  Eyes: EOM are normal.  Neck: Neck supple.  Pulmonary/Chest: Effort normal.  Abdominal: Soft.  Neurological: She is alert and oriented to person, place, and time.  Skin: Skin is warm. Capillary refill takes less than 2 seconds.  Psychiatric: She has a normal mood and affect. Her behavior is normal. Judgment and thought content normal.  Nursing note and vitals reviewed.   Ortho Exam Bilateral hand exam shows mildly positive Tinel's.  Negative Phalen's and negative Durkin's.  Negative Spurling's.  No focal motor or sensory deficits.  Normal reflexes.  Cervical spine is not significantly tender. Specialty Comments:  No specialty comments available.  Imaging: No results found.   PMFS History: Patient Active Problem List   Diagnosis Date Noted  . Pelvic pain 06/13/2017  . 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 Breast lump in female 02/04/2015  . DENTAL CARIES 10/07/2010  . HYPERBILIRUBINEMIA 07/09/2010  . SKIN RASH 05/14/2010  . ANXIETY DEPRESSION 05/04/2010  . FIBROCYSTIC BREAST DISEASE 05/04/2010  . UNSPECIFIED ADJUSTMENT REACTION 04/17/2009  . MIGRAINE HEADACHE 01/27/2009  . Depression 06/11/2008   Past Medical History:  Diagnosis Date  . Depression   . No pertinent past medical history     Family History  Problem Relation Age of Onset  . Diabetes Mother     Past Surgical History:  Procedure Laterality Date  . APPENDECTOMY    .  LAPAROSCOPIC APPENDECTOMY N/A 05/10/2016   Procedure: APPENDECTOMY LAPAROSCOPIC;  Surgeon: Jimmye Norman, MD;  Location: [facility] OR;  Service: General;  Laterality: N/A;   Social History   Occupational History  . Not on file  Tobacco Use  . Smoking status: Never Smoker  . Smokeless tobacco: Never Used  Substance and Sexual Activity  . Alcohol use: No  . Drug use: No  . Sexual activity: Yes    Birth control/protection: None

## 2018-05-29 ENCOUNTER — Ambulatory Visit: Payer: Self-pay | Attending: Nurse Practitioner | Admitting: Nurse Practitioner

## 2018-05-29 ENCOUNTER — Encounter: Payer: Self-pay | Admitting: Nurse Practitioner

## 2018-05-29 VITALS — BP 110/82 | HR 93 | Temp 99.1°F | Ht <= 58 in | Wt 177.2 lb

## 2018-05-29 DIAGNOSIS — F419 Anxiety disorder, unspecified: Secondary | ICD-10-CM | POA: Insufficient documentation

## 2018-05-29 DIAGNOSIS — Z79899 Other long term (current) drug therapy: Secondary | ICD-10-CM | POA: Insufficient documentation

## 2018-05-29 DIAGNOSIS — F32A Depression, unspecified: Secondary | ICD-10-CM

## 2018-05-29 DIAGNOSIS — Z833 Family history of diabetes mellitus: Secondary | ICD-10-CM | POA: Insufficient documentation

## 2018-05-29 DIAGNOSIS — F329 Major depressive disorder, single episode, unspecified: Secondary | ICD-10-CM | POA: Insufficient documentation

## 2018-05-29 DIAGNOSIS — Z9049 Acquired absence of other specified parts of digestive tract: Secondary | ICD-10-CM | POA: Insufficient documentation

## 2018-05-29 DIAGNOSIS — R42 Dizziness and giddiness: Secondary | ICD-10-CM | POA: Insufficient documentation

## 2018-05-29 MED ORDER — FLUTICASONE PROPIONATE 50 MCG/ACT NA SUSP: 2.0000 | Freq: Every day | NASAL | 6 refills | Status: DC

## 2018-05-29 MED ORDER — CETIRIZINE HCL 10 MG PO TABS: 10.0000 mg | ORAL_TABLET | Freq: Every day | ORAL | 11 refills | Status: DC

## 2018-05-29 MED ORDER — HYDROXYZINE HCL 25 MG PO TABS: 25.0000 mg | ORAL_TABLET | Freq: Three times a day (TID) | ORAL | 1 refills | Status: DC | PRN

## 2018-05-29 MED FILL — ?CETIRIZINE HCL 10 MG TABLE: 10 | 30 days supply | Qty: 30 | Fill #0

## 2018-05-29 MED FILL — FLUTICASONE PROP 50 MCG SPR: 50 | 30 days supply | Qty: 16 | Fill #0

## 2018-05-29 MED FILL — hydrOXYzine HCL 25 MG TABS: 25 | 20 days supply | Qty: 60 | Fill #0

## 2018-05-29 NOTE — Progress Notes (Signed)
Assessment & Plan:  Amber Travis was seen today for dizziness.  Diagnoses and all orders for this visit:  Dizziness -     fluticasone (FLONASE) 50 MCG/ACT nasal spray; Place 2 sprays into both nostrils daily. -     cetirizine (ZYRTEC) 10 MG tablet; Take 1 tablet (10 mg total) by mouth daily. She has a significant amount of fluid in her ears. Recommended she take zyrtec and flonase daily as prescribed.   Anxiety and depression -     hydrOXYzine (ATARAX/VISTARIL) 25 MG tablet; Take 1 tablet (25 mg total) by mouth 3 (three) times daily as needed.    Patient has been counseled on age-appropriate routine health concerns for screening and prevention. These are reviewed and up-to-date. Referrals have been placed accordingly. Immunizations are up-to-date or declined.    Subjective:   Chief Complaint  Patient presents with  . Dizziness    Pt. stated she has been feeling dizzy and think it may due to her anxiety medications.    HPI Amber Travis 37 y.o. female presents to office today with complaints of dizziness. VRI was used to communicate directly with patient for the entire encounter including providing detailed patient instructions.    Dizziness She is endorsing increased dizziness since starting Buspar. She stopped taking buspar today and states she does not feel it is helping to reduce her anxiety.  Will start hydroxyzine and stop buspar today.  The dizziness has been present for a few months. The patient describes the symptoms as dizziness. Symptoms are exacerbated by none identified The patient also complains of none. Patient denies aural pressure, otalgia, otorrhea, tinnitus hearing loss.  She has been prescribed zyrtec and flonase which she has not been taking.   Anxiety and Depression Depression symptoms well controlled with zoloft however anxiety symptoms are not. Will switch from buspar to hydroxyzine.  Depression screen [facility] 2/9 05/29/2018 05/22/2018 05/19/2018 04/05/2018 04/03/2018   Decreased Interest 1 1 1 3 1   Down, Depressed, Hopeless 1 2 0 2 1  PHQ - 2 Score 2 3 1 5 2   Altered sleeping 3 2 1 3 3   Tired, decreased energy 1 1 1 3 3   Change in appetite 3 3 3 3 3   Feeling bad or failure about yourself  1 1 2 2 1   Trouble concentrating 1 1 1  - 1  Moving slowly or fidgety/restless 1 0 1 - 2  Suicidal thoughts 0 0 0 - 0  PHQ-9 Score 12 11 10 16 15   Some recent data might be hidden   GAD 7 : Generalized Anxiety Score 05/29/2018 05/22/2018 05/19/2018 04/03/2018  Nervous, Anxious, on Edge 1 2 2 3   Control/stop worrying 2 1 1 1   Worry too much - different things 3 1 3 2   Trouble relaxing 2 2 2 3   Restless 1 1 1 2   Easily annoyed or irritable 3 2 3 3   Afraid - awful might happen - - 1 1  Total GAD 7 Score 12 9 13 15     Review of Systems  Constitutional: Negative for fever, malaise/fatigue and weight loss.  HENT: Negative.  Negative for nosebleeds.   Eyes: Negative.  Negative for blurred vision, double vision and photophobia.  Respiratory: Negative.  Negative for cough and shortness of breath.   Cardiovascular: Negative.  Negative for chest pain, palpitations and leg swelling.  Gastrointestinal: Negative.  Negative for heartburn, nausea and vomiting.  Musculoskeletal: Negative.  Negative for myalgias.  Neurological: Positive for dizziness. Negative for focal  weakness, seizures and headaches.  Psychiatric/Behavioral: Positive for depression. Negative for suicidal ideas. The patient is nervous/anxious. The patient does not have insomnia.     Past Medical History:  Diagnosis Date  . Depression   . No pertinent past medical history     Past Surgical History:  Procedure Laterality Date  . APPENDECTOMY    . LAPAROSCOPIC APPENDECTOMY N/A 05/10/2016   Procedure: APPENDECTOMY LAPAROSCOPIC;  Surgeon: Jimmye Norman, MD;  Location: [facility] OR;  Service: General;  Laterality: N/A;    Family History  Problem Relation Age of Onset  . Diabetes Mother     Social History  Reviewed with no changes to be made today.   Outpatient Medications Prior to Visit  Medication Sig Dispense Refill  . busPIRone (BUSPAR) 30 MG tablet Take 1 tablet (30 mg total) by mouth 3 (three) times daily. 90 tablet 2  . ibuprofen (ADVIL,MOTRIN) 600 MG tablet Take 1 tablet (600 mg total) by mouth 3 (three) times daily. X 5 days then prn pain 60 tablet 0  . sertraline (ZOLOFT) 100 MG tablet Take 1 tablet (100 mg total) by mouth daily. 90 tablet 0  . cetirizine (ZYRTEC) 10 MG tablet Take 1 tablet (10 mg total) by mouth daily. (Patient not taking: Reported on 05/29/2018) 30 tablet 11  . fluticasone (FLONASE) 50 MCG/ACT nasal spray Place 2 sprays into both nostrils daily. (Patient not taking: Reported on 05/29/2018) 16 g 6   No facility-administered medications prior to visit.     No Known Allergies     Objective:    BP 110/82 (BP Location: Right Arm, Patient Position: Sitting, Cuff Size: Large)   Pulse 93   Temp 99.1 F (37.3 C) (Oral)   Ht 4\' 5"  (1.346 m)   Wt 177 lb 3.2 oz (80.4 kg)   LMP 05/08/2018   SpO2 94%   BMI 44.35 kg/m  Wt Readings from Last 3 Encounters:  05/29/18 177 lb 3.2 oz (80.4 kg)  05/22/18 175 lb 11.2 oz (79.7 kg)  05/19/18 174 lb 6.4 oz (79.1 kg)    Physical Exam  Constitutional: She is oriented to person, place, and time. She appears well-developed and well-nourished. She is cooperative.  HENT:  Head: Normocephalic and atraumatic.  Right Ear: A middle ear effusion is present.  Left Ear: A middle ear effusion is present.  Eyes: EOM are normal.  Neck: Normal range of motion.  Cardiovascular: Normal rate, regular rhythm and normal heart sounds. Exam reveals no gallop and no friction rub.  No murmur heard. Pulmonary/Chest: Effort normal and breath sounds normal. No tachypnea. No respiratory distress. She has no decreased breath sounds. She has no wheezes. She has no rhonchi. She has no rales. She exhibits no tenderness.  Abdominal: Bowel sounds are normal.   Musculoskeletal: Normal range of motion. She exhibits no edema.  Neurological: She is alert and oriented to person, place, and time. She has normal strength. She displays a negative Romberg sign. Coordination and gait normal.  Skin: Skin is warm and dry.  Psychiatric: She has a normal mood and affect. Her behavior is normal. Judgment and thought content normal.  Nursing note and vitals reviewed.        Patient has been counseled extensively about nutrition and exercise as well as the importance of adherence with medications and regular follow-up. The patient was given clear instructions to go to ER or return to medical center if symptoms don't improve, worsen or new problems develop. The patient verbalized understanding.  Follow-up: Return in about 3 weeks (around 06/19/2018) for dizziness .   Claiborne RiggZelda W Aaidyn San, FNP-BC [facility] [place], [place] [phone]   05/29/2018, 4:10 PM

## 2018-05-31 ENCOUNTER — Telehealth: Payer: Self-pay | Admitting: Nurse Practitioner

## 2018-05-31 NOTE — Telephone Encounter (Signed)
Patient called stating that one of the medications prescribed to her makes her feel weak and dizzy, states she doesn't know if she should continue to take it. Patient doesn't know which medication it is. Please follow up.

## 2018-05-31 NOTE — Telephone Encounter (Signed)
She should stop taking the vistaril

## 2018-05-31 NOTE — Telephone Encounter (Signed)
Will route to PCP 

## 2018-05-31 NOTE — Telephone Encounter (Signed)
Patient stated she was only taking Cetirizine today and she stated she felt weak and dizzy.

## 2018-06-04 ENCOUNTER — Other Ambulatory Visit: Payer: Self-pay | Admitting: Nurse Practitioner

## 2018-06-04 DIAGNOSIS — R42 Dizziness and giddiness: Secondary | ICD-10-CM

## 2018-06-04 NOTE — Telephone Encounter (Signed)
She can stop taking the zyrtec. I will place a referral to ENT

## 2018-06-05 ENCOUNTER — Other Ambulatory Visit: Payer: Self-pay | Admitting: Nurse Practitioner

## 2018-06-05 DIAGNOSIS — F329 Major depressive disorder, single episode, unspecified: Secondary | ICD-10-CM

## 2018-06-05 DIAGNOSIS — F419 Anxiety disorder, unspecified: Principal | ICD-10-CM

## 2018-06-05 MED ORDER — SERTRALINE HCL 100 MG PO TABS: 200.0000 mg | ORAL_TABLET | Freq: Every day | ORAL | 0 refills | Status: DC

## 2018-06-05 NOTE — Telephone Encounter (Signed)
Will route to PCP 

## 2018-06-05 NOTE — Telephone Encounter (Signed)
Pt called to request a change in medication she states that  -sertraline (ZOLOFT) 100 MG tablet  Is not working and understands that she has a referral to Otolaryngology   (pt pharmacy is [facility] )

## 2018-06-05 NOTE — Telephone Encounter (Signed)
We will increase the zoloft to 200mg . She can take 2 tablets until she runs out of her current zoloft and then she can  pick up her new prescription. I also suggest she contact [facility] or [facility] to make an appointment to speak with a therapist.    [facility]  [address], [place], [place] [address]  [phone] or [phone]  [facility] of the [employer] (Domestic Violence, Rape & Victim Assistance [phone]  [facility]  [address], [place]  [address]                [phone] or [phone]  [facility]    (ONLY from 8am-4pm)    [phone]  [facility] (24/7)   [phone]  [place] National Suicide Hotline   (517) 171-9297 Len Childs)  Support from local police to aid getting patient to hospital (http://www.[place].gov/index.aspx?page=2797)         ONGOING BEHAVIORAL HEALTH SUPPORT FOR UNINSURED and UNDERINSURED:  [facility]  [phone]   [address]  Walk-in first time, Monday-Friday, 8:30am-5:00pm  *Bring snack, drink, something to do, long wait at first visit, they do have pharmacy for behavioral health medications/ Bring own interpreter at first visit, if needed  [employer] [facility] [employer]  [phone]  [address]  Walk-in Monday-Friday, 8:30am-12pm & 1-2:30pm  *pacientes que hablen espanol, favor comunicarse con el Sr. [place], extension [phone] o la [address], extension [phone] para hacer [facility]:  [phone] or  [phone] (24/hour helpline)  [address]  Call to make appointment, tends to be a long wait to begin services, depending on insurance

## 2018-06-06 ENCOUNTER — Telehealth: Payer: Self-pay | Admitting: Nurse Practitioner

## 2018-06-06 DIAGNOSIS — G5601 Carpal tunnel syndrome, right upper limb: Secondary | ICD-10-CM

## 2018-06-06 HISTORY — DX: Carpal tunnel syndrome, right upper limb: G56.01

## 2018-06-06 NOTE — Telephone Encounter (Signed)
Pt was called and informed of the dosage change, she understood and had no further questions about this medication.

## 2018-06-06 NOTE — Telephone Encounter (Signed)
Pt called to request advice on her ear problem, she is not able to see the specialist until January and would like some type of treatment or advice until her appt. Please follow up

## 2018-06-06 NOTE — Telephone Encounter (Signed)
Will route to PCP 

## 2018-06-07 ENCOUNTER — Ambulatory Visit: Payer: Self-pay | Attending: Family Medicine | Admitting: Physician Assistant

## 2018-06-07 ENCOUNTER — Other Ambulatory Visit: Payer: Self-pay

## 2018-06-07 ENCOUNTER — Encounter: Payer: Self-pay | Admitting: Nurse Practitioner

## 2018-06-07 VITALS — BP 128/86 | HR 81 | Temp 98.2°F | Resp 18 | Ht 59.0 in | Wt 178.4 lb

## 2018-06-07 DIAGNOSIS — H6983 Other specified disorders of Eustachian tube, bilateral: Secondary | ICD-10-CM | POA: Insufficient documentation

## 2018-06-07 DIAGNOSIS — Z789 Other specified health status: Secondary | ICD-10-CM

## 2018-06-07 DIAGNOSIS — F419 Anxiety disorder, unspecified: Secondary | ICD-10-CM | POA: Insufficient documentation

## 2018-06-07 DIAGNOSIS — Z79899 Other long term (current) drug therapy: Secondary | ICD-10-CM | POA: Insufficient documentation

## 2018-06-07 DIAGNOSIS — N898 Other specified noninflammatory disorders of vagina: Secondary | ICD-10-CM | POA: Insufficient documentation

## 2018-06-07 DIAGNOSIS — R42 Dizziness and giddiness: Secondary | ICD-10-CM | POA: Insufficient documentation

## 2018-06-07 DIAGNOSIS — F329 Major depressive disorder, single episode, unspecified: Secondary | ICD-10-CM | POA: Insufficient documentation

## 2018-06-07 DIAGNOSIS — F32A Depression, unspecified: Secondary | ICD-10-CM

## 2018-06-07 LAB — POCT URINALYSIS DIP (CLINITEK)
BILIRUBIN UA: NEGATIVE mg/dL
Bilirubin, UA: NEGATIVE
Glucose, UA: NEGATIVE mg/dL
Nitrite, UA: NEGATIVE
POC,PROTEIN,UA: NEGATIVE
SPEC GRAV UA: 1.02 (ref 1.010–1.025)
Urobilinogen, UA: 0.2 E.U./dL
pH, UA: 6 (ref 5.0–8.0)

## 2018-06-07 MED ORDER — FLUTICASONE PROPIONATE 50 MCG/ACT NA SUSP: 2.0000 | Freq: Every day | NASAL | 6 refills | Status: DC

## 2018-06-07 MED ORDER — BUSPIRONE HCL 30 MG PO TABS: 30.0000 mg | ORAL_TABLET | Freq: Three times a day (TID) | ORAL | 2 refills | Status: AC

## 2018-06-07 MED ORDER — SERTRALINE HCL 100 MG PO TABS: 200.0000 mg | ORAL_TABLET | Freq: Every day | ORAL | 3 refills | Status: DC

## 2018-06-07 NOTE — Progress Notes (Signed)
Patient ID: Amber Travis, female   DOB: 1981-07-28, 37 y.o.   MRN: [id number]      Vanessia Bokhari, is a 37 y.o. female  NWG:[id number]  [id number]:[id number]  DOB - Aug 12, 1981  Subjective:  Chief Complaint and HPI: Amber Travis is a 37 y.o. female here today for  Burning in vagina x 3 days.  No dysuria.  No recent antibiotics.  No pelvic pain.  Doesn't want to have sex due to pain.  No new lubes or condoms.  Monogamous with partner.  No fever/chills.    "Ana" with [employer]. Also c/o ear congestion and pain on and off.  Says she is using flonase daily but reported to nurse she wasn't taking it.    Many other questions addressed regarding her other medications.  ROS:   Constitutional:  No f/c, No night sweats, No unexplained weight loss. EENT:  No vision changes, No blurry vision, No hearing changes. No mouth, throat, or ear problems.  Respiratory: No cough, No SOB Cardiac: No CP, no palpitations GI:  No abd pain, No N/V/D. GU: No Urinary s/sx Musculoskeletal: No joint pain Neuro: No headache, no dizziness, no motor weakness.  Skin: No rash Endocrine:  No polydipsia. No polyuria.  Psych: Denies SI/HI, +anxiety/depression  No problems updated.  ALLERGIES: No Known Allergies  PAST MEDICAL HISTORY: Past Medical History:  Diagnosis Date  . Depression   . No pertinent past medical history     MEDICATIONS AT HOME: Prior to Admission medications   Medication Sig Start Date End Date Taking? Authorizing Provider  sertraline (ZOLOFT) 100 MG tablet Take 2 tablets (200 mg total) by mouth daily. 06/07/18 09/05/18 Yes Smita Lesh, Marzella Schlein, PA-C  cetirizine (ZYRTEC) 10 MG tablet Take 1 tablet (10 mg total) by mouth daily. Patient not taking: Reported on 06/07/2018 05/29/18   Claiborne Rigg, NP  fluticasone [facility]) 50 MCG/ACT nasal spray Place 2 sprays into both nostrils daily. 06/07/18   Anders Simmonds, PA-C     Objective:  EXAM:   Vitals:   06/07/18 1050  BP:  128/86  Pulse: 81  Resp: 18  Temp: 98.2 F (36.8 C)  TempSrc: Oral  SpO2: 97%  Weight: 178 lb 6.4 oz (80.9 kg)  Height: 4\' 11"  (1.499 m)    General appearance : A&OX3. NAD. Non-toxic-appearing HEENT: Atraumatic and Normocephalic.  PERRLA. EOM intact.  TM full B. Mouth-MMM, post pharynx WNL w/o erythema, No PND. Neck: supple, no JVD. No cervical lymphadenopathy. No thyromegaly Chest/Lungs:  Breathing-non-labored, Good air entry bilaterally, breath sounds normal without rales, rhonchi, or wheezing  CVS: S1 S2 regular, no murmurs, gallops, rubs  Extremities: Bilateral Lower Ext shows no edema, both legs are warm to touch with = pulse throughout Neurology:  CN II-XII grossly intact, Non focal.   Psych:  TP linear. J/I WNL. Normal speech. Appropriate eye contact and affect.  Skin:  No Rash  Data Review Lab Results  Component Value Date   HGBA1C 5.1 04/05/2017   HGBA1C 5.0 02/20/2014     Assessment & Plan   1. Vaginal discharge No red flags for STD - POCT URINALYSIS DIP (CLINITEK) - Urine cytology ancillary only  2. Dysfunction of both eustachian tubes Add mucinex D.  Keep ENT appt - fluticasone (FLONASE) 50 MCG/ACT nasal spray; Place 2 sprays into both nostrils daily.  Dispense: 16 g; Refill: 6  3. Dizziness Likely due to #2 and increase in zoloft dose.  No neuro red flags - fluticasone (FLONASE) 50 MCG/ACT nasal  spray; Place 2 sprays into both nostrils daily.  Dispense: 16 g; Refill: 6  4. Anxiety and depression - sertraline (ZOLOFT) 100 MG tablet; Take 2 tablets (200 mg total) by mouth daily.  Dispense: 60 tablet; Refill: 3  5. Language barrier stratus interpreters used and additional time performing visit was required.      Patient have been counseled extensively about nutrition and exercise  Return for keep December appt with Zelda.  The patient was given clear instructions to go to ER or return to medical center if symptoms don't improve, worsen or new  problems develop. The patient verbalized understanding. The patient was told to call to get lab results if they haven't heard anything in the next week.     Georgian Co, PA-C [facility] [place], [place] [phone]   06/07/2018, 11:24 AM

## 2018-06-07 NOTE — Progress Notes (Unsigned)
She can try an over the counter decongestant like sudafed. There are not many options left aside from seeing ENT. I would suggest she make sure her ears are protected if she is showering.

## 2018-06-07 NOTE — Patient Instructions (Signed)
Mucinex D   Disfuncin de la trompa de Eustaquio (Eustachian Tube Dysfunction) La trompa de Eustaquio conecta el odo medio con la parte posterior de la nariz. Regula la presin de Web designer odo medio al permitir que el aire circule por el odo y la [place]. Tambin ayuda a Forensic psychologist lquido del espacio del odo [place]. Cuando la trompa de Eustaquio no funciona bien, se puede producir una acumulacin de presin de aire, lquido o ambos en el odo medio. La disfuncin de la trompa de Eustaquio puede Audiological scientist a uno o a [employer]. CAUSAS Esta afeccin ocurre cuando la trompa de [place] se bloquea o no puede abrirse normalmente. Puede ser consecuencia de lo siguiente:  Infecciones en los odos.  Resfriados y otras infecciones de las vas respiratorias superiores.  Alergias.  Irritacin, por ejemplo, por el humo del cigarrillo o los cidos del estmago que vuelven hacia el esfago (reflujo gastroesofgico).  Cambios sbitos en la presin del aire, como cuando baja un avin.  Crecimientos anormales en la nariz o la garganta, como plipos nasales, tumores o tejido engrosado en la parte posterior de la garganta (adenoides). FACTORES DE RIESGO Puede ser ms probable que esta afeccin se desarrolle en las personas que fuman y las personas que tienen sobrepeso. Tambin es ms probable que la disfuncin de la trompa de Data processing manager se produzca en los nios, especialmente los nios que presentan lo siguiente:  Ciertos defectos congnitos en la boca, como fisura del paladar.  Amgdalas y Summer Set. SNTOMAS Los sntomas de esta afeccin pueden incluir lo siguiente:  Sensacin de que el odo est tapado.  Dolor de odo.  Ruidos como chasquidos o crujidos en el odo.  Zumbidos en el odo.  Prdida auditiva.  Prdida del equilibrio. Los sntomas pueden empeorar cuando la presin que tiene a su alrededor [place], como cuando viaja a una zona de mayor altura o en  avin. DIAGNSTICO Esta afeccin se puede diagnosticar en funcin de lo siguiente:  Sus sntomas.  Un examen fsico del odo, de la [place] y de Administrator.  Pruebas en las que se determine lo siguiente: ? El movimiento de la membrana del tmpano (timpanograma). ? La audicin Allie Bossier). TRATAMIENTO El tratamiento depende de la causa y de la gravedad de Astronomer. Si los sntomas son leves, es posible que pueda aliviarlos haciendo circular aire ("destapar") dentro de los odos. Si tiene sntomas de [employer], el tratamiento puede incluir lo siguiente:  Tax inspector.  Antihistamnicos.  Aerosoles nasales o gotas para los odos que contengan medicamentos para reducir la hinchazn (corticoides). En algunos casos, puede necesitar un procedimiento para drenar el lquido de la membrana del tmpano (miringotoma). En este procedimiento, se coloca un tubo pequeo en la membrana del tmpano para hacer lo siguiente:  Drenar el lquido.  Restablecer el aire en el espacio del odo medio. INSTRUCCIONES PARA EL CUIDADO EN EL HOGAR  Tome los medicamentos de venta libre y los recetados solamente como se lo haya indicado el mdico.  Utilice las tcnicas recomendadas por el mdico para ayudar a Conservator, museum/gallery los odos. Estas pueden incluir las siguientes: ? Airline pilot. ? Bostezos. ? Tragar vigorosamente con frecuencia. ? Cerrar la boca, taparse la nariz y soplar suavemente por la nariz como si tratara de soltar el aire.  No haga ninguna de estas cosas hasta que el mdico lo autorice: ? Viajar a grandes alturas. ? Viajar en avin. ? Trabajar en una cabina o una habitacin presurizada. ?  Practicar buceo.  Mantener secos los odos. Squese bien los odos despus de ducharse o darse un bao.  No fume.  Concurra a todas las visitas de control como se lo haya indicado el mdico. Esto es importante. SOLICITE ATENCIN MDICA SI:  Los sntomas no desaparecen despus del  tratamiento.  Los sntomas regresan despus del tratamiento.  No puede destaparse los odos.  Tiene los siguientes sntomas: ? Grant Ruts. ? Dolor en el odo. ? Dolor de cabeza o en el cuello. ? Hay lquido que sale del odo.  La audicin cambia de repente.  Se siente muy mareado.  Pierde el equilibrio. Esta informacin no tiene Theme park manager el consejo del mdico. Asegrese de hacerle al mdico cualquier pregunta que tenga. Document Released: 04/21/2016 Document Revised: 04/21/2016 Document Reviewed: 09/11/2014 Elsevier Interactive Patient Education  [employer].

## 2018-06-07 NOTE — Progress Notes (Signed)
Pain: Burning sensation in vagina, days  Patient stated with the Zoloft she was told she was going to start taking 200mg  but she only does 100mg    Patient stated she wamted to know why she was taken off of her anxiety medication at a previous visit. Patient stated that   Ear pain doesn't have ENT appt til January, not willing to travel, ear pain

## 2018-06-08 LAB — URINE CYTOLOGY ANCILLARY ONLY
Chlamydia: NEGATIVE
Neisseria Gonorrhea: NEGATIVE
TRICH (WINDOWPATH): NEGATIVE

## 2018-06-08 NOTE — Progress Notes (Signed)
CMA spoke to patient to inform on PCP advising.  Pt. Verified DOB.  Pt. Understood.  [facility] Spanish interpreter [place] [phone] assist with the call.

## 2018-06-09 LAB — URINE CYTOLOGY ANCILLARY ONLY
Bacterial vaginitis: NEGATIVE
Candida vaginitis: NEGATIVE

## 2018-06-09 LAB — URINE CULTURE

## 2018-06-15 ENCOUNTER — Encounter (INDEPENDENT_AMBULATORY_CARE_PROVIDER_SITE_OTHER): Payer: Self-pay | Admitting: Physical Medicine and Rehabilitation

## 2018-06-15 ENCOUNTER — Ambulatory Visit (INDEPENDENT_AMBULATORY_CARE_PROVIDER_SITE_OTHER): Payer: Self-pay | Admitting: Physical Medicine and Rehabilitation

## 2018-06-15 ENCOUNTER — Telehealth (INDEPENDENT_AMBULATORY_CARE_PROVIDER_SITE_OTHER): Payer: Self-pay | Admitting: *Deleted

## 2018-06-15 ENCOUNTER — Other Ambulatory Visit (INDEPENDENT_AMBULATORY_CARE_PROVIDER_SITE_OTHER): Payer: Self-pay

## 2018-06-15 DIAGNOSIS — R202 Paresthesia of skin: Secondary | ICD-10-CM

## 2018-06-15 MED ORDER — TRAMADOL HCL 50 MG PO TABS: 50.0000 mg | ORAL_TABLET | Freq: Two times a day (BID) | ORAL | 0 refills | Status: DC

## 2018-06-15 MED FILL — SERTRALINE HCL 100 MG TAB: 100 | 15 days supply | Qty: 30 | Fill #0

## 2018-06-15 NOTE — Progress Notes (Signed)
   Numeric Pain Rating Scale and Functional Assessment Average Pain 7   In the last MONTH (on 0-10 scale) has pain interfered with the following?  1. General activity like being  able to carry out your everyday physical activities such as walking, climbing stairs, carrying groceries, or moving a chair?  Rating(4)       

## 2018-06-15 NOTE — Telephone Encounter (Signed)
Tramadol #30

## 2018-06-15 NOTE — Telephone Encounter (Signed)
CALLED INTO PHARM. PATIENT AWARE.

## 2018-06-15 NOTE — Telephone Encounter (Signed)
Please advise. Patient in our office.

## 2018-06-16 MED FILL — traMADol HCL 50 MG TABS: 50 | 15 days supply | Qty: 30 | Fill #0

## 2018-06-20 ENCOUNTER — Telehealth: Payer: Self-pay | Admitting: Nurse Practitioner

## 2018-06-20 NOTE — Telephone Encounter (Signed)
Patient wants to get a sooner appointment and for her ear specialist even if she has to pay.Please follow up with patient.

## 2018-06-20 NOTE — Telephone Encounter (Signed)
Will route to PCP 

## 2018-06-20 NOTE — Telephone Encounter (Signed)
Needs appt

## 2018-06-20 NOTE — Telephone Encounter (Signed)
Patient called because she is having nose pain and is not sure what she should do. She says the pain is on her nose. Please follow up with patient (spanish)

## 2018-06-20 NOTE — Telephone Encounter (Signed)
Sent a New Referral to  [facility] ph. # [phone] address [address] .They will contact the patient to schedule an appointment . $100 Visit . Thank You

## 2018-06-22 ENCOUNTER — Ambulatory Visit (INDEPENDENT_AMBULATORY_CARE_PROVIDER_SITE_OTHER): Payer: Self-pay | Admitting: Orthopaedic Surgery

## 2018-06-22 ENCOUNTER — Other Ambulatory Visit: Payer: Self-pay

## 2018-06-22 ENCOUNTER — Encounter (HOSPITAL_BASED_OUTPATIENT_CLINIC_OR_DEPARTMENT_OTHER): Payer: Self-pay | Admitting: *Deleted

## 2018-06-22 DIAGNOSIS — G5601 Carpal tunnel syndrome, right upper limb: Secondary | ICD-10-CM

## 2018-06-22 DIAGNOSIS — G5602 Carpal tunnel syndrome, left upper limb: Secondary | ICD-10-CM

## 2018-06-22 NOTE — Progress Notes (Signed)
Office Visit Note   Patient: Amber Travis           Date of Birth: [birth year]           MRN: [id number] Visit Date: 06/22/2018              Requested by: Claiborne Rigg, NP [address] [place], [place] [address] PCP: Claiborne Rigg, NP   Assessment & Plan: Visit Diagnoses:  1. Right carpal tunnel syndrome   2. Left carpal tunnel syndrome     Plan: Impression is moderate right carpal tunnel syndrome and mild to moderate left carpal tunnel syndrome.  Her symptoms are worse on the right side.  We discussed surgical versus nonsurgical treatment and the risks and benefits and the rehab and recovery and she wishes to proceed with right carpal tunnel release as soon as possible.  Today's encounter was performed through an interpreter.  Follow-Up Instructions: Return for 2 week postop visit.   Orders:  No orders of the defined types were placed in this encounter.  No orders of the defined types were placed in this encounter.     Procedures: No procedures performed   Clinical Data: No additional findings.   Subjective: Chief Complaint  Patient presents with  . Follow-up    ncs    Amber Travis follows up today for nerve conduction studies.  Denies any changes in medical history.   Review of Systems  Constitutional: Negative.   HENT: Negative.   Eyes: Negative.   Respiratory: Negative.   Cardiovascular: Negative.   Endocrine: Negative.   Musculoskeletal: Negative.   Neurological: Negative.   Hematological: Negative.   Psychiatric/Behavioral: Negative.   All other systems reviewed and are negative.    Objective: Vital Signs: There were no vitals taken for this visit.  Physical Exam  Constitutional: She is oriented to person, place, and time. She appears well-developed and well-nourished.  Pulmonary/Chest: Effort normal.  Neurological: She is alert and oriented to person, place, and time.  Skin: Skin is warm. Capillary refill takes less than 2 seconds.    Psychiatric: She has a normal mood and affect. Her behavior is normal. Judgment and thought content normal.  Nursing note and vitals reviewed.   Ortho Exam Bilateral hand exam is stable. Specialty Comments:  No specialty comments available.  Imaging: No results found.   PMFS History: Patient Active Problem List   Diagnosis Date Noted  . Pelvic pain 06/13/2017  . 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 Breast lump in female 02/04/2015  . DENTAL CARIES 10/07/2010  . HYPERBILIRUBINEMIA 07/09/2010  . SKIN RASH 05/14/2010  . ANXIETY DEPRESSION 05/04/2010  . FIBROCYSTIC BREAST DISEASE 05/04/2010  . UNSPECIFIED ADJUSTMENT REACTION 04/17/2009  . MIGRAINE HEADACHE 01/27/2009  . Depression 06/11/2008   Past Medical History:  Diagnosis Date  . Depression   . No pertinent past medical history     Family History  Problem Relation Age of Onset  . Diabetes Mother     Past Surgical History:  Procedure Laterality Date  . APPENDECTOMY    . LAPAROSCOPIC APPENDECTOMY N/A 05/10/2016   Procedure: APPENDECTOMY LAPAROSCOPIC;  Surgeon: Jimmye Norman, MD;  Location[facility] OR;  Service: General;  Laterality: N/A;   Social History   Occupational History  . Not on file  Tobacco Use  . Smoking status: Never Smoker  . Smokeless tobacco: Never Used  Substance and Sexual Activity  . Alcohol use: No  . Drug use: No  . Sexual  activity: Yes    Birth control/protection: None

## 2018-06-22 NOTE — Telephone Encounter (Signed)
Pt. Was inform she have ENT appt on Monday 06/26/2018.

## 2018-06-22 NOTE — Pre-Procedure Instructions (Signed)
Alis will be interpreter for pt., per Susan at [facility]; please call [phone] if surgery time changes. 

## 2018-06-22 NOTE — Progress Notes (Signed)
Amber Travis - 37 y.o. female MRN [id number]  Date of birth: August 12, 1981  Office Visit Note: Visit Date: 06/15/2018 PCP: Claiborne Rigg, NP Referred by: Claiborne Rigg, NP  Subjective: Chief Complaint  Patient presents with  . Neck - Pain  . Right Arm - Pain, Numbness, Tingling  . Left Arm - Tingling, Numbness, Pain  . Right Hand - Numbness, Tingling  . Left Hand - Tingling, Numbness   HPI:  Amber Travis is a 37 y.o. female who comes in today At the request of Dr. Glee Arvin for electrodiagnostic studies of both upper limbs.  She reports numbness and tingling in both arms mostly right more than left with referral into both hands globally in a nondermatomal fashion.  She cannot say if any fingers are worse than the others.  She reports pain in the neck and arms.  She reports this started about 2 months ago and she gets intermittent worsening without particular worsening with position or activity.  She gets a lot of nocturnal complaints.  She has been wearing braces which have not helped.  She is right-hand dominant has not had prior electrodiagnostic studies.  She reports her average pain is 7 out of 10.  ROS Otherwise per HPI.  Assessment & Plan: Visit Diagnoses:  1. Paresthesia of skin     Plan: Impression: The above electrodiagnostic study is ABNORMAL and reveals evidence of:  1.  A moderate right median nerve entrapment at the wrist (carpal tunnel syndrome) affecting sensory and motor components.   2.  A mild to moderate left median nerve entrapment at the wrist (carpal tunnel syndrome) affecting sensory components.    There is no significant electrodiagnostic evidence of any other focal nerve entrapment, brachial plexopathy or generalized peripheral neuropathy.  As you know, this particular electrodiagnostic study cannot rule out chemical radiculitis or sensory only radiculopathy.   Recommendations: 1.  Follow-up with referring physician. 2.  Continue current  management of symptoms. 3.  Continue use of resting splint at night-time and as needed during the day. 4.  Suggest surgical evaluation.   Meds & Orders: No orders of the defined types were placed in this encounter.   Orders Placed This Encounter  Procedures  . NCV with EMG (electromyography)    Follow-up: Return for  Glee Arvin, M.D..   Procedures: No procedures performed  EMG & NCV Findings: Evaluation of the right median motor nerve showed prolonged distal onset latency (5.1 ms).  The left median (across palm) sensory nerve showed prolonged distal peak latency (Wrist, 4.8 ms) and prolonged distal peak latency (Palm, 2.8 ms).  The right median (across palm) sensory nerve showed prolonged distal peak latency (Wrist, 5.3 ms), reduced amplitude (9.6 V), and prolonged distal peak latency (Palm, 3.3 ms).  All remaining nerves (as indicated in the following tables) were within normal limits.  Left vs. Right side comparison data for the median motor nerve indicates abnormal L-R latency difference (0.9 ms).    All examined muscles (as indicated in the following table) showed no evidence of electrical instability.    Impression: The above electrodiagnostic study is ABNORMAL and reveals evidence of:  1.  A moderate right median nerve entrapment at the wrist (carpal tunnel syndrome) affecting sensory and motor components.   2.  A mild to moderate left median nerve entrapment at the wrist (carpal tunnel syndrome) affecting sensory components.    There is no significant electrodiagnostic evidence of any other focal nerve entrapment, brachial plexopathy  or generalized peripheral neuropathy.  As you know, this particular electrodiagnostic study cannot rule out chemical radiculitis or sensory only radiculopathy.   Recommendations: 1.  Follow-up with referring physician. 2.  Continue current management of symptoms. 3.  Continue use of resting splint at night-time and as needed during the day. 4.   Suggest surgical evaluation.  ___________________________ Naaman Plummer FAAPMR Board Certified, American Board of Physical Medicine and Rehabilitation    Nerve Conduction Studies Anti Sensory Summary Table   Stim Site NR Peak (ms) Norm Peak (ms) P-T Amp (V) Norm P-T Amp Site1 Site2 Delta-P (ms) Dist (cm) Vel (m/s) Norm Vel (m/s)  Left Median Acr Palm Anti Sensory (2nd Digit)  31.6C  Wrist    *4.8 <3.6 34.6 >10 Wrist Palm 2.0 0.0    Palm    *2.8 <2.0 2.8         Right Median Acr Palm Anti Sensory (2nd Digit)  32C  Wrist    *5.3 <3.6 *9.6 >10 Wrist Palm 2.0 0.0    Palm    *3.3 <2.0 1.5         Right Radial Anti Sensory (Base 1st Digit)  32.3C  Wrist    2.1 <3.1 26.0  Wrist Base 1st Digit 2.1 0.0    Right Ulnar Anti Sensory (5th Digit)  32.3C  Wrist    3.0 <3.7 27.8 >15.0 Wrist 5th Digit 3.0 14.0 47 >38   Motor Summary Table   Stim Site NR Onset (ms) Norm Onset (ms) O-P Amp (mV) Norm O-P Amp Site1 Site2 Delta-0 (ms) Dist (cm) Vel (m/s) Norm Vel (m/s)  Left Median Motor (Abd Poll Brev)  31.8C  Wrist    4.2 <4.2 8.9 >5 Elbow Wrist 3.8 19.5 51 >50  Elbow    8.0  8.5         Right Median Motor (Abd Poll Brev)  31.9C  Wrist    *5.1 <4.2 10.0 >5 Elbow Wrist 3.9 19.5 50 >50  Elbow    9.0  9.7         Right Ulnar Motor (Abd Dig Min)  31.7C  Wrist    2.5 <4.2 11.2 >3 B Elbow Wrist 2.8 18.5 66 >53  B Elbow    5.3  12.5  A Elbow B Elbow 1.1 10.0 91 >53  A Elbow    6.4  12.6          EMG   Side Muscle Nerve Root Ins Act Fibs Psw Amp Dur Poly Recrt Int Dennie Bible Comment  Right Abd Poll Brev Median C8-T1 Nml Nml Nml Nml Nml 0 Nml Nml   Right 1stDorInt Ulnar C8-T1 Nml Nml Nml Nml Nml 0 Nml Nml   Right PronatorTeres Median C6-7 Nml Nml Nml Nml Nml 0 Nml Nml   Right Biceps Musculocut C5-6 Nml Nml Nml Nml Nml 0 Nml Nml   Right Deltoid Axillary C5-6 Nml Nml Nml Nml Nml 0 Nml Nml     Nerve Conduction Studies Anti Sensory Left/Right Comparison   Stim Site L Lat (ms) R Lat (ms) L-R Lat (ms)  L Amp (V) R Amp (V) L-R Amp (%) Site1 Site2 L Vel (m/s) R Vel (m/s) L-R Vel (m/s)  Median Acr Palm Anti Sensory (2nd Digit)  31.6C  Wrist *4.8 *5.3 0.5 34.6 *9.6 72.3 Wrist Palm     Palm *2.8 *3.3 0.5 2.8 1.5 46.4       Radial Anti Sensory (Base 1st Digit)  32.3C  Wrist  2.1   26.0  Wrist Base 1st Digit     Ulnar Anti Sensory (5th Digit)  32.3C  Wrist  3.0   27.8  Wrist 5th Digit  47    Motor Left/Right Comparison   Stim Site L Lat (ms) R Lat (ms) L-R Lat (ms) L Amp (mV) R Amp (mV) L-R Amp (%) Site1 Site2 L Vel (m/s) R Vel (m/s) L-R Vel (m/s)  Median Motor (Abd Poll Brev)  31.8C  Wrist 4.2 *5.1 *0.9 8.9 10.0 11.0 Elbow Wrist 51 50 1  Elbow 8.0 9.0 1.0 8.5 9.7 12.4       Ulnar Motor (Abd Dig Min)  31.7C  Wrist  2.5   11.2  B Elbow Wrist  66   B Elbow  5.3   12.5  A Elbow B Elbow  91   A Elbow  6.4   12.6           Waveforms:                Clinical History: No specialty comments available.     Objective:  VS:  HT:    WT:   BMI:     BP:   HR: bpm  TEMP: ( )  RESP:  Physical Exam  Constitutional: She is oriented to person, place, and time.  Musculoskeletal:  Inspection reveals no atrophy of the bilateral APB or FDI or hand intrinsics. There is no swelling, color changes, allodynia or dystrophic changes. There is 5 out of 5 strength in the bilateral wrist extension, finger abduction and long finger flexion. There is intact sensation to light touch in all dermatomal and peripheral nerve distributions. There is a negative Froment's test bilaterally.  There is a positive Phalen's test bilaterally. There is a negative Hoffmann's test bilaterally.  Neurological: She is alert and oriented to person, place, and time. She exhibits normal muscle tone. Coordination normal.  Skin: Skin is warm and dry. No erythema.    Ortho Exam Imaging: No results found.

## 2018-06-22 NOTE — Procedures (Signed)
EMG & NCV Findings: Evaluation of the right median motor nerve showed prolonged distal onset latency (5.1 ms).  The left median (across palm) sensory nerve showed prolonged distal peak latency (Wrist, 4.8 ms) and prolonged distal peak latency (Palm, 2.8 ms).  The right median (across palm) sensory nerve showed prolonged distal peak latency (Wrist, 5.3 ms), reduced amplitude (9.6 V), and prolonged distal peak latency (Palm, 3.3 ms).  All remaining nerves (as indicated in the following tables) were within normal limits.  Left vs. Right side comparison data for the median motor nerve indicates abnormal L-R latency difference (0.9 ms).    All examined muscles (as indicated in the following table) showed no evidence of electrical instability.    Impression: The above electrodiagnostic study is ABNORMAL and reveals evidence of:  1.  A moderate right median nerve entrapment at the wrist (carpal tunnel syndrome) affecting sensory and motor components.   2.  A mild to moderate left median nerve entrapment at the wrist (carpal tunnel syndrome) affecting sensory components.    There is no significant electrodiagnostic evidence of any other focal nerve entrapment, brachial plexopathy or generalized peripheral neuropathy.  As you know, this particular electrodiagnostic study cannot rule out chemical radiculitis or sensory only radiculopathy.   Recommendations: 1.  Follow-up with referring physician. 2.  Continue current management of symptoms. 3.  Continue use of resting splint at night-time and as needed during the day. 4.  Suggest surgical evaluation.  ___________________________ Naaman Plummer FAAPMR Board Certified, American Board of Physical Medicine and Rehabilitation    Nerve Conduction Studies Anti Sensory Summary Table   Stim Site NR Peak (ms) Norm Peak (ms) P-T Amp (V) Norm P-T Amp Site1 Site2 Delta-P (ms) Dist (cm) Vel (m/s) Norm Vel (m/s)  Left Median Acr Palm Anti Sensory (2nd Digit)   31.6C  Wrist    *4.8 <3.6 34.6 >10 Wrist Palm 2.0 0.0    Palm    *2.8 <2.0 2.8         Right Median Acr Palm Anti Sensory (2nd Digit)  32C  Wrist    *5.3 <3.6 *9.6 >10 Wrist Palm 2.0 0.0    Palm    *3.3 <2.0 1.5         Right Radial Anti Sensory (Base 1st Digit)  32.3C  Wrist    2.1 <3.1 26.0  Wrist Base 1st Digit 2.1 0.0    Right Ulnar Anti Sensory (5th Digit)  32.3C  Wrist    3.0 <3.7 27.8 >15.0 Wrist 5th Digit 3.0 14.0 47 >38   Motor Summary Table   Stim Site NR Onset (ms) Norm Onset (ms) O-P Amp (mV) Norm O-P Amp Site1 Site2 Delta-0 (ms) Dist (cm) Vel (m/s) Norm Vel (m/s)  Left Median Motor (Abd Poll Brev)  31.8C  Wrist    4.2 <4.2 8.9 >5 Elbow Wrist 3.8 19.5 51 >50  Elbow    8.0  8.5         Right Median Motor (Abd Poll Brev)  31.9C  Wrist    *5.1 <4.2 10.0 >5 Elbow Wrist 3.9 19.5 50 >50  Elbow    9.0  9.7         Right Ulnar Motor (Abd Dig Min)  31.7C  Wrist    2.5 <4.2 11.2 >3 B Elbow Wrist 2.8 18.5 66 >53  B Elbow    5.3  12.5  A Elbow B Elbow 1.1 10.0 91 >53  A Elbow    6.4  12.6  EMG   Side Muscle Nerve Root Ins Act Fibs Psw Amp Dur Poly Recrt Int Dennie Bible Comment  Right Abd Poll Brev Median C8-T1 Nml Nml Nml Nml Nml 0 Nml Nml   Right 1stDorInt Ulnar C8-T1 Nml Nml Nml Nml Nml 0 Nml Nml   Right PronatorTeres Median C6-7 Nml Nml Nml Nml Nml 0 Nml Nml   Right Biceps Musculocut C5-6 Nml Nml Nml Nml Nml 0 Nml Nml   Right Deltoid Axillary C5-6 Nml Nml Nml Nml Nml 0 Nml Nml     Nerve Conduction Studies Anti Sensory Left/Right Comparison   Stim Site L Lat (ms) R Lat (ms) L-R Lat (ms) L Amp (V) R Amp (V) L-R Amp (%) Site1 Site2 L Vel (m/s) R Vel (m/s) L-R Vel (m/s)  Median Acr Palm Anti Sensory (2nd Digit)  31.6C  Wrist *4.8 *5.3 0.5 34.6 *9.6 72.3 Wrist Palm     Palm *2.8 *3.3 0.5 2.8 1.5 46.4       Radial Anti Sensory (Base 1st Digit)  32.3C  Wrist  2.1   26.0  Wrist Base 1st Digit     Ulnar Anti Sensory (5th Digit)  32.3C  Wrist  3.0   27.8  Wrist 5th  Digit  47    Motor Left/Right Comparison   Stim Site L Lat (ms) R Lat (ms) L-R Lat (ms) L Amp (mV) R Amp (mV) L-R Amp (%) Site1 Site2 L Vel (m/s) R Vel (m/s) L-R Vel (m/s)  Median Motor (Abd Poll Brev)  31.8C  Wrist 4.2 *5.1 *0.9 8.9 10.0 11.0 Elbow Wrist 51 50 1  Elbow 8.0 9.0 1.0 8.5 9.7 12.4       Ulnar Motor (Abd Dig Min)  31.7C  Wrist  2.5   11.2  B Elbow Wrist  66   B Elbow  5.3   12.5  A Elbow B Elbow  91   A Elbow  6.4   12.6           Waveforms:

## 2018-06-27 ENCOUNTER — Telehealth: Payer: Self-pay | Admitting: Nurse Practitioner

## 2018-06-27 NOTE — Telephone Encounter (Signed)
Patient called because [facility] and [facility] told her she could not be seen for her operation because they do not accept the [facility] financial assistance. Please follow up.

## 2018-06-28 ENCOUNTER — Ambulatory Visit (HOSPITAL_BASED_OUTPATIENT_CLINIC_OR_DEPARTMENT_OTHER)
Admission: RE | Admit: 2018-06-28 | Discharge: 2018-06-28 | Disposition: A | Discharge status: Home / Self Care | Payer: Self-pay | Source: Ambulatory Visit | Attending: Orthopaedic Surgery | Admitting: Orthopaedic Surgery

## 2018-06-28 ENCOUNTER — Ambulatory Visit (HOSPITAL_BASED_OUTPATIENT_CLINIC_OR_DEPARTMENT_OTHER): Payer: Self-pay | Admitting: Anesthesiology

## 2018-06-28 ENCOUNTER — Telehealth: Payer: Self-pay | Admitting: Nurse Practitioner

## 2018-06-28 ENCOUNTER — Other Ambulatory Visit: Payer: Self-pay

## 2018-06-28 ENCOUNTER — Encounter (HOSPITAL_BASED_OUTPATIENT_CLINIC_OR_DEPARTMENT_OTHER): Payer: Self-pay | Admitting: Anesthesiology

## 2018-06-28 ENCOUNTER — Encounter (HOSPITAL_BASED_OUTPATIENT_CLINIC_OR_DEPARTMENT_OTHER)
Admission: RE | Disposition: A | Discharge status: Home / Self Care | Payer: Self-pay | Source: Ambulatory Visit | Attending: Orthopaedic Surgery

## 2018-06-28 DIAGNOSIS — Z833 Family history of diabetes mellitus: Secondary | ICD-10-CM | POA: Insufficient documentation

## 2018-06-28 DIAGNOSIS — Z79899 Other long term (current) drug therapy: Secondary | ICD-10-CM | POA: Insufficient documentation

## 2018-06-28 DIAGNOSIS — F329 Major depressive disorder, single episode, unspecified: Secondary | ICD-10-CM | POA: Insufficient documentation

## 2018-06-28 DIAGNOSIS — G5601 Carpal tunnel syndrome, right upper limb: Secondary | ICD-10-CM

## 2018-06-28 DIAGNOSIS — E669 Obesity, unspecified: Secondary | ICD-10-CM | POA: Insufficient documentation

## 2018-06-28 DIAGNOSIS — Z6835 Body mass index (BMI) 35.0-35.9, adult: Secondary | ICD-10-CM | POA: Insufficient documentation

## 2018-06-28 HISTORY — DX: Carpal tunnel syndrome, right upper limb: G56.01

## 2018-06-28 HISTORY — PX: CARPAL TUNNEL RELEASE: SHX101

## 2018-06-28 LAB — POCT PREGNANCY, URINE: Preg Test, Ur: NEGATIVE

## 2018-06-28 SURGERY — CARPAL TUNNEL RELEASE
Anesthesia: Regional | Site: Wrist | Laterality: Right

## 2018-06-28 MED ORDER — MIDAZOLAM HCL 2 MG/2ML IJ SOLN: 1.0000 mg | INTRAMUSCULAR | Status: DC | PRN

## 2018-06-28 MED ORDER — HYDROMORPHONE HCL 1 MG/ML IJ SOLN: 0.2500 mg | INTRAMUSCULAR | Status: DC | PRN

## 2018-06-28 MED ORDER — FENTANYL CITRATE (PF) 100 MCG/2ML IJ SOLN: 50.0000 ug | INTRAMUSCULAR | Status: DC | PRN

## 2018-06-28 MED ORDER — PROPOFOL 10 MG/ML IV BOLUS
INTRAVENOUS | Status: AC
  Filled 2018-06-28: qty 20

## 2018-06-28 MED ORDER — MIDAZOLAM HCL 5 MG/5ML IJ SOLN
INTRAMUSCULAR | Status: DC | PRN
  Administered 2018-06-28: 1 mg via INTRAVENOUS

## 2018-06-28 MED ORDER — PROPOFOL 500 MG/50ML IV EMUL
INTRAVENOUS | Status: DC | PRN
  Administered 2018-06-28: 25 ug/kg/min via INTRAVENOUS

## 2018-06-28 MED ORDER — PROMETHAZINE HCL 25 MG/ML IJ SOLN: 6.2500 mg | INTRAMUSCULAR | Status: DC | PRN

## 2018-06-28 MED ORDER — ONDANSETRON HCL 4 MG PO TABS: 4.0000 mg | ORAL_TABLET | Freq: Three times a day (TID) | ORAL | 0 refills | Status: DC | PRN

## 2018-06-28 MED ORDER — CHLORHEXIDINE GLUCONATE 4 % EX LIQD: 60.0000 mL | Freq: Once | CUTANEOUS | Status: DC

## 2018-06-28 MED ORDER — DEXAMETHASONE SODIUM PHOSPHATE 10 MG/ML IJ SOLN
INTRAMUSCULAR | Status: AC
  Filled 2018-06-28: qty 1

## 2018-06-28 MED ORDER — LIDOCAINE HCL (PF) 0.5 % IJ SOLN
INTRAMUSCULAR | Status: DC | PRN
  Administered 2018-06-28: 50 mL via INTRAVENOUS

## 2018-06-28 MED ORDER — SCOPOLAMINE 1 MG/3DAYS TD PT72: 1.0000 | MEDICATED_PATCH | Freq: Once | TRANSDERMAL | Status: DC | PRN

## 2018-06-28 MED ORDER — OXYCODONE HCL 5 MG PO TABS: 5.0000 mg | ORAL_TABLET | Freq: Once | ORAL | Status: DC | PRN

## 2018-06-28 MED ORDER — CEFAZOLIN SODIUM-DEXTROSE 2-4 GM/100ML-% IV SOLN
INTRAVENOUS | Status: AC
  Filled 2018-06-28: qty 100

## 2018-06-28 MED ORDER — ONDANSETRON HCL 4 MG/2ML IJ SOLN
INTRAMUSCULAR | Status: DC | PRN
  Administered 2018-06-28: 4 mg via INTRAVENOUS

## 2018-06-28 MED ORDER — OXYCODONE HCL 5 MG/5ML PO SOLN: 5.0000 mg | Freq: Once | ORAL | Status: DC | PRN

## 2018-06-28 MED ORDER — ONDANSETRON HCL 4 MG/2ML IJ SOLN
INTRAMUSCULAR | Status: AC
  Filled 2018-06-28: qty 2

## 2018-06-28 MED ORDER — FENTANYL CITRATE (PF) 100 MCG/2ML IJ SOLN
INTRAMUSCULAR | Status: AC
  Filled 2018-06-28: qty 2

## 2018-06-28 MED ORDER — BUPIVACAINE HCL (PF) 0.25 % IJ SOLN
INTRAMUSCULAR | Status: DC | PRN
  Administered 2018-06-28: 8 mL

## 2018-06-28 MED ORDER — LACTATED RINGERS IV SOLN: INTRAVENOUS | Status: DC

## 2018-06-28 MED ORDER — LIDOCAINE 2% (20 MG/ML) 5 ML SYRINGE
INTRAMUSCULAR | Status: AC
  Filled 2018-06-28: qty 5

## 2018-06-28 MED ORDER — MIDAZOLAM HCL 2 MG/2ML IJ SOLN
INTRAMUSCULAR | Status: AC
  Filled 2018-06-28: qty 2

## 2018-06-28 MED ORDER — LACTATED RINGERS IV SOLN
INTRAVENOUS | Status: DC
  Administered 2018-06-28: 10:00:00 via INTRAVENOUS

## 2018-06-28 MED ORDER — HYDROCODONE-ACETAMINOPHEN 5-325 MG PO TABS: 1.0000 | ORAL_TABLET | Freq: Four times a day (QID) | ORAL | 0 refills | Status: DC | PRN

## 2018-06-28 MED ORDER — CEFAZOLIN SODIUM-DEXTROSE 2-4 GM/100ML-% IV SOLN
2.0000 g | INTRAVENOUS | Status: AC
  Administered 2018-06-28: 2 g via INTRAVENOUS

## 2018-06-28 MED ORDER — FENTANYL CITRATE (PF) 100 MCG/2ML IJ SOLN
INTRAMUSCULAR | Status: DC | PRN
  Administered 2018-06-28: 50 ug via INTRAVENOUS

## 2018-06-28 MED FILL — HYDROCODON-APAP 5-325: 5-325 | 4 days supply | Qty: 30 | Fill #0

## 2018-06-28 MED FILL — ONDANSETRON HCL 4 MG TABLET: 4 | 7 days supply | Qty: 40 | Fill #0

## 2018-06-28 SURGICAL SUPPLY — 45 items
BANDAGE ACE 3X5.8 VEL STRL LF (GAUZE/BANDAGES/DRESSINGS) ×3 IMPLANT
BLADE MINI RND TIP GREEN BEAV (BLADE) ×3 IMPLANT
BLADE SURG 15 STRL LF DISP TIS (BLADE) ×1 IMPLANT
BLADE SURG 15 STRL SS (BLADE) ×2
BNDG ESMARK 4X9 LF (GAUZE/BANDAGES/DRESSINGS) ×3 IMPLANT
BNDG PLASTER X FAST 3X3 WHT LF (CAST SUPPLIES) IMPLANT
BRUSH SCRUB EZ PLAIN DRY (MISCELLANEOUS) ×3 IMPLANT
CANISTER SUCT 1200ML W/VALVE (MISCELLANEOUS) ×3 IMPLANT
CORD BIPOLAR FORCEPS 12FT (ELECTRODE) ×3 IMPLANT
COVER BACK TABLE 60X90IN (DRAPES) ×3 IMPLANT
COVER MAYO STAND STRL (DRAPES) ×3 IMPLANT
COVER WAND RF STERILE (DRAPES) IMPLANT
CUFF TOURNIQUET SINGLE 18IN (TOURNIQUET CUFF) ×3 IMPLANT
DECANTER SPIKE VIAL GLASS SM (MISCELLANEOUS) IMPLANT
DRAPE EXTREMITY T 121X128X90 (DRAPE) ×3 IMPLANT
DRAPE IMP U-DRAPE 54X76 (DRAPES) ×3 IMPLANT
DRAPE SURG 17X23 STRL (DRAPES) ×3 IMPLANT
GAUZE 4X4 16PLY RFD (DISPOSABLE) IMPLANT
GAUZE SPONGE 4X4 12PLY STRL (GAUZE/BANDAGES/DRESSINGS) ×3 IMPLANT
GAUZE XEROFORM 1X8 LF (GAUZE/BANDAGES/DRESSINGS) ×3 IMPLANT
GLOVE BIOGEL PI IND STRL 7.0 (GLOVE) ×1 IMPLANT
GLOVE BIOGEL PI INDICATOR 7.0 (GLOVE) ×2
GLOVE ECLIPSE 7.0 STRL STRAW (GLOVE) ×3 IMPLANT
GLOVE SKINSENSE NS SZ7.5 (GLOVE) ×2
GLOVE SKINSENSE STRL SZ7.5 (GLOVE) ×1 IMPLANT
GLOVE SURG SYN 7.5  E (GLOVE) ×2
GLOVE SURG SYN 7.5 E (GLOVE) ×1 IMPLANT
GOWN STRL REIN XL XLG (GOWN DISPOSABLE) ×3 IMPLANT
GOWN STRL REUS W/ TWL XL LVL3 (GOWN DISPOSABLE) ×2 IMPLANT
GOWN STRL REUS W/TWL XL LVL3 (GOWN DISPOSABLE) ×4
NEEDLE HYPO 25X1 1.5 SAFETY (NEEDLE) ×3 IMPLANT
NS IRRIG 1000ML POUR BTL (IV SOLUTION) ×3 IMPLANT
PACK BASIN DAY SURGERY FS (CUSTOM PROCEDURE TRAY) ×3 IMPLANT
PAD CAST 3X4 CTTN HI CHSV (CAST SUPPLIES) ×1 IMPLANT
PADDING CAST COTTON 3X4 STRL (CAST SUPPLIES) ×2
RUBBERBAND STERILE (MISCELLANEOUS) ×6 IMPLANT
STOCKINETTE 4X48 STRL (DRAPES) ×3 IMPLANT
SUT ETHILON 3 0 PS 1 (SUTURE) ×3 IMPLANT
SYR BULB 3OZ (MISCELLANEOUS) ×3 IMPLANT
SYR CONTROL 10ML LL (SYRINGE) ×3 IMPLANT
TOWEL GREEN STERILE FF (TOWEL DISPOSABLE) ×3 IMPLANT
TRAY DSU PREP LF (CUSTOM PROCEDURE TRAY) ×3 IMPLANT
TUBE CONNECTING 20'X1/4 (TUBING)
TUBE CONNECTING 20X1/4 (TUBING) IMPLANT
UNDERPAD 30X30 (UNDERPADS AND DIAPERS) ×3 IMPLANT

## 2018-06-28 NOTE — Anesthesia Procedure Notes (Signed)
Procedure Name: MAC Date/Time: 06/28/2018 10:40 AM Performed by: Marrianne Mood, CRNA Pre-anesthesia Checklist: Patient identified, Timeout performed, Emergency Drugs available, Suction available and Patient being monitored Patient Re-evaluated:Patient Re-evaluated prior to induction Oxygen Delivery Method: Simple face mask

## 2018-06-28 NOTE — Transfer of Care (Signed)
Immediate Anesthesia Transfer of Care Note  Patient: Amber Travis  Procedure(s) Performed: RIGHT CARPAL TUNNEL RELEASE (Right Wrist)  Patient Location: PACU  Anesthesia Type:Bier block  Level of Consciousness: awake and patient cooperative  Airway & Oxygen Therapy: Patient Spontanous Breathing and Patient connected to face mask oxygen  Post-op Assessment: Report given to RN and Post -op Vital signs reviewed and stable  Post vital signs: Reviewed and stable  Last Vitals:  Vitals Value Taken Time  BP 118/79 06/28/2018 11:12 AM  Temp    Pulse 66 06/28/2018 11:14 AM  Resp 19 06/28/2018 11:14 AM  SpO2 98 % 06/28/2018 11:14 AM  Vitals shown include unvalidated device data.  Last Pain:  Vitals:   06/28/18 0942  TempSrc: Oral  PainSc: 5          Complications: No apparent anesthesia complications

## 2018-06-28 NOTE — Telephone Encounter (Signed)
I spoke to patient and I told him that financial assistant program said when patient need a surgery and the specialist is part of [facility] is up to the provider if he wants to do it or not with [facility] . She understood and she is [facility] thanks

## 2018-06-28 NOTE — Anesthesia Procedure Notes (Addendum)
Anesthesia Regional Block: Bier block (IV Regional)   Pre-Anesthetic Checklist: ,, timeout performed, Correct Patient, Correct Site, Correct Laterality, Correct Procedure, Correct Position, site marked, Risks and benefits discussed,  Surgical consent,  Pre-op evaluation,  At surgeon's request and post-op pain management  Laterality: Right      Narrative:  Start time: 06/28/2018 10:43 AM End time: 06/28/2018 10:45 AM

## 2018-06-28 NOTE — H&P (Signed)
PREOPERATIVE H&P  Chief Complaint: right carpal tunnel syndrome  HPI: Amber Travis is a 37 y.o. female who presents for surgical treatment of right carpal tunnel syndrome.  She denies any changes in medical history.  Past Medical History:  Diagnosis Date  . Carpal tunnel syndrome, right 06/2018  . Depression    Past Surgical History:  Procedure Laterality Date  . LAPAROSCOPIC APPENDECTOMY N/A 05/10/2016   Procedure: APPENDECTOMY LAPAROSCOPIC;  Surgeon: Jimmye Norman, MD;  Location: [facility] OR;  Service: General;  Laterality: N/A;   Social History   Socioeconomic History  . Marital status: Single    Spouse name: Not on file  . Number of children: Not on file  . Years of education: Not on file  . Highest education level: Not on file  Occupational History  . Not on file  Social Needs  . Financial resource strain: Not on file  . Food insecurity:    Worry: Not on file    Inability: Not on file  . Transportation needs:    Medical: Not on file    Non-medical: Not on file  Tobacco Use  . Smoking status: Never Smoker  . Smokeless tobacco: Never Used  Substance and Sexual Activity  . Alcohol use: No  . Drug use: No  . Sexual activity: Yes    Birth control/protection: None  Lifestyle  . Physical activity:    Days per week: Not on file    Minutes per session: Not on file  . Stress: Not on file  Relationships  . Social connections:    Talks on phone: Not on file    Gets together: Not on file    Attends religious service: Not on file    Active member of club or organization: Not on file    Attends meetings of clubs or organizations: Not on file    Relationship status: Not on file  Other Topics Concern  . Not on file  Social History Narrative  . Not on file   Family History  Problem Relation Age of Onset  . Diabetes Mother    No Known Allergies Prior to Admission medications   Medication Sig Start Date End Date Taking? Authorizing Provider  busPIRone (BUSPAR) 30  MG tablet Take 1 tablet (30 mg total) by mouth 3 (three) times daily. 06/07/18 07/07/18 Yes McClung, Marzella Schlein, PA-C  cetirizine (ZYRTEC) 10 MG tablet Take 1 tablet (10 mg total) by mouth daily. 05/29/18  Yes Claiborne Rigg, NP  fluticasone (FLONASE) 50 MCG/ACT nasal spray Place 2 sprays into both nostrils daily. 06/07/18  Yes McClung, Angela M, PA-C  ibuprofen (ADVIL,MOTRIN) 600 MG tablet Take 600 mg by mouth every 6 (six) hours as needed.   Yes [provider]  Multiple Vitamin (MULTIVITAMIN) tablet Take 1 tablet by mouth daily.   Yes [provider]  traMADol (ULTRAM) 50 MG tablet Take 1 tablet (50 mg total) by mouth 2 (two) times daily. 06/15/18  Yes Tarry Kos, MD     Positive ROS: All other systems have been reviewed and were otherwise negative with the exception of those mentioned in the HPI and as above.  Physical Exam: General: Alert, no acute distress Cardiovascular: No pedal edema Respiratory: No cyanosis, no use of accessory musculature GI: abdomen soft Skin: No lesions in the area of chief complaint Neurologic: Sensation intact distally Psychiatric: Patient is competent for consent with normal mood and affect Lymphatic: no lymphedema  MUSCULOSKELETAL: exam stable  Assessment: right carpal  tunnel syndrome  Plan: Plan for Procedure(s): RIGHT CARPAL TUNNEL RELEASE  The risks benefits and alternatives were discussed with the patient including but not limited to the risks of nonoperative treatment, versus surgical intervention including infection, bleeding, nerve injury,  blood clots, cardiopulmonary complications, morbidity, mortality, among others, and they were willing to proceed.   Glee Arvin, MD   06/28/2018 7:27 AM

## 2018-06-28 NOTE — Anesthesia Preprocedure Evaluation (Signed)
Anesthesia Evaluation  Patient identified by MRN, date of birth, ID band Patient awake    Reviewed: Allergy & Precautions, H&P , NPO status , Patient's Chart, lab work & pertinent test results  History of Anesthesia Complications Negative for: history of anesthetic complications  Airway Mallampati: II  TM Distance: >3 FB Neck ROM: full    Dental no notable dental hx.    Pulmonary neg pulmonary ROS,    Pulmonary exam normal breath sounds clear to auscultation       Cardiovascular negative cardio ROS Normal cardiovascular exam Rhythm:Regular Rate:Normal     Neuro/Psych  Headaches, PSYCHIATRIC DISORDERS Depression negative neurological ROS  negative psych ROS   GI/Hepatic negative GI ROS, Neg liver ROS,   Endo/Other  negative endocrine ROS  Renal/GU negative Renal ROS  negative genitourinary   Musculoskeletal negative musculoskeletal ROS (+)   Abdominal (+) + obese,   Peds negative pediatric ROS (+)  Hematology negative hematology ROS (+)   Anesthesia Other Findings   Reproductive/Obstetrics negative OB ROS                             Anesthesia Physical  Anesthesia Plan  ASA: II  Anesthesia Plan: Bier Block and Bier Block-LIDOCAINE ONLY   Post-op Pain Management:    Induction: Intravenous  PONV Risk Score and Plan: 2 and Ondansetron and Midazolam  Airway Management Planned: Simple Face Mask  Additional Equipment:   Intra-op Plan:   Post-operative Plan:   Informed Consent: I have reviewed the patients History and Physical, chart, labs and discussed the procedure including the risks, benefits and alternatives for the proposed anesthesia with the patient or authorized representative who has indicated his/her understanding and acceptance.   Dental advisory given  Plan Discussed with: CRNA  Anesthesia Plan Comments:         Anesthesia Quick Evaluation

## 2018-06-28 NOTE — Discharge Instructions (Signed)
Postoperative instructions: ° °Weightbearing instructions: no heavy lifting ° °Dressing instructions: Keep your dressing and/or splint clean and dry at all times.  It will be removed at your first post-operative appointment.  Your stitches and/or staples will be removed at this visit. ° °Incision instructions:  Do not soak your incision for 3 weeks after surgery.  If the incision gets wet, pat dry and do not scrub the incision. ° °Pain control:  You have been given a prescription to be taken as directed for post-operative pain control.  In addition, elevate the operative extremity above the heart at all times to prevent swelling and throbbing pain. ° °Take over-the-counter Colace, 100mg by mouth twice a day while taking narcotic pain medications to help prevent constipation. ° °Follow up appointments: °1) 12-14 days for suture removal and wound check. °2) Dr. Xu as scheduled. ° ° ------------------------------------------------------------------------------------------------------------- ° °After Surgery Pain Control: ° °After your surgery, post-surgical discomfort or pain is likely. This discomfort can last several days to a few weeks. At certain times of the day your discomfort may be more intense.  °Did you receive a nerve block?  °A nerve block can provide pain relief for one hour to two days after your surgery. As long as the nerve block is working, you will experience little or no sensation in the area the surgeon operated on.  °As the nerve block wears off, you will begin to experience pain or discomfort. It is very important that you begin taking your prescribed pain medication before the nerve block fully wears off. Treating your pain at the first sign of the block wearing off will ensure your pain is better controlled and more tolerable when full-sensation returns. Do not wait until the pain is intolerable, as the medicine will be less effective. It is better to treat pain in advance than to try and catch  up.  °General Anesthesia:  °If you did not receive a nerve block during your surgery, you will need to start taking your pain medication shortly after your surgery and should continue to do so as prescribed by your surgeon.  °Pain Medication:  °Most commonly we prescribe Vicodin and Percocet for post-operative pain. Both of these medications contain a combination of acetaminophen (Tylenol®) and a narcotic to help control pain.  °· It takes between 30 and 45 minutes before pain medication starts to work. It is important to take your medication before your pain level gets too intense.  °· Nausea is a common side effect of many pain medications. You will want to eat something before taking your pain medicine to help prevent nausea.  °· If you are taking a prescription pain medication that contains acetaminophen, we recommend that you do not take additional over the counter acetaminophen (Tylenol®).  °Other pain relieving options:  °· Using a cold pack to ice the affected area a few times a day (15 to 20 minutes at a time) can help to relieve pain, reduce swelling and bruising.  °· Elevation of the affected area can also help to reduce pain and swelling. ° ° ° ° °Post Anesthesia Home Care Instructions ° °Activity: °Get plenty of rest for the remainder of the day. A responsible individual must stay with you for 24 hours following the procedure.  °For the next 24 hours, DO NOT: °-Drive a car °-Operate machinery °-Drink alcoholic beverages °-Take any medication unless instructed by your physician °-Make any legal decisions or sign important papers. ° °Meals: °Start with liquid foods such as gelatin   or soup. Progress to regular foods as tolerated. Avoid greasy, spicy, heavy foods. If nausea and/or vomiting occur, drink only clear liquids until the nausea and/or vomiting subsides. Call your physician if vomiting continues. ° °Special Instructions/Symptoms: °Your throat may feel dry or sore from the anesthesia or the  breathing tube placed in your throat during surgery. If this causes discomfort, gargle with warm salt water. The discomfort should disappear within 24 hours. ° °If you had a scopolamine patch placed behind your ear for the management of post- operative nausea and/or vomiting: ° °1. The medication in the patch is effective for 72 hours, after which it should be removed.  Wrap patch in a tissue and discard in the trash. Wash hands thoroughly with soap and water. °2. You may remove the patch earlier than 72 hours if you experience unpleasant side effects which may include dry mouth, dizziness or visual disturbances. °3. Avoid touching the patch. Wash your hands with soap and water after contact with the patch. °  ° °

## 2018-06-28 NOTE — Op Note (Signed)
   Carpal tunnel op note  DATE OF SURGERY:06/28/2018  PREOPERATIVE DIAGNOSIS:  Right Carpal tunnel syndrome  POSTOPERATIVE DIAGNOSIS: same  PROCEDURE:  Right carpal tunnel release. CPT [phone]  SURGEON: Surgeon(s): Tarry Kos, MD  ASSIST: Oneal Grout, PA-C; necessary for the timely completion of procedure and due to complexity of procedure.  ANESTHESIA:  Monitor Anesthesia Care  TOURNIQUET TIME: 20 mins  BLOOD LOSS: Minimal.  COMPLICATIONS: None.  PATHOLOGY: None.  INDICATIONS: The patient is a 37 y.o. -year-old female who presented with carpal tunnel syndrome failing nonsurgical management, indicated for surgical release.  DESCRIPTION OF PROCEDURE: The patient was identified in the preoperative holding area.  The operative site was marked by the surgeon and confirmed by the patient.  He was brought back to the operating room.  Anesthesia was induced by the anesthesia team.  A well padded nonsterile tourniquet was placed. The operative extremity was prepped and draped in standard sterile fashion.  A timeout was performed.  Preoperative antibiotics were given.   A palmar incision was made about 5 mm ulnar to the thenar crease.  The palmar aponeurosis was exposed and divided in line with the skin incision. The palmaris brevis was visualized and divided.  The distal edge of the transcarpal ligament was identified. A hemostat was inserted into the carpal tunnel to protect the median nerve and the flexor tendons. Then, the transverse carpal ligament was released under direct visualization. Proximally, a subcutaneous tunnel was made allowing a Sewell retractor to be placed. Then, the distal portion of the antebrachial fascia was released. Distally, all fibrous bands were released. The median nerve was visualized, and the fat pad was exposed. Following release, local infiltration with 0.25% of Sensorcaine was given. The tourniquet was deflated. Hemostasis achieved.  Wound was  irrigated and closed with 4-0 nylon sutures. Sterile dressing applied. The patient was transferred to the recovery room in stable condition after all counts were correct.  POSTOPERATIVE PLAN: To start nerve gliding exercises as tolerated and no heavy lifting for four weeks.

## 2018-06-28 NOTE — Telephone Encounter (Signed)
Patient called back for you. Please follow up with patient.

## 2018-06-28 NOTE — Telephone Encounter (Signed)
Left a voice mail to patient to call me back.

## 2018-06-28 NOTE — Anesthesia Postprocedure Evaluation (Signed)
Anesthesia Post Note  Patient: Amber Travis  Procedure(s) Performed: RIGHT CARPAL TUNNEL RELEASE (Right Wrist)     Patient location during evaluation: PACU Anesthesia Type: Bier Block Level of consciousness: awake and alert Pain management: pain level controlled Vital Signs Assessment: post-procedure vital signs reviewed and stable Respiratory status: spontaneous breathing, nonlabored ventilation and respiratory function stable Cardiovascular status: blood pressure returned to baseline and stable Postop Assessment: no apparent nausea or vomiting Anesthetic complications: no    Last Vitals:  Vitals:   06/28/18 1127 06/28/18 1135  BP:  110/70  Pulse: 82 66  Resp: (!) 21 18  Temp:  36.4 C  SpO2: 99% 100%    Last Pain:  Vitals:   06/28/18 1135  TempSrc:   PainSc: 0-No pain                 Lowella Curb

## 2018-06-29 ENCOUNTER — Encounter (HOSPITAL_BASED_OUTPATIENT_CLINIC_OR_DEPARTMENT_OTHER): Payer: Self-pay | Admitting: Orthopaedic Surgery

## 2018-06-29 NOTE — Addendum Note (Signed)
Addendum  created 06/29/18 1004 by Lance Coon, CRNA   Charge Capture section accepted, Visit diagnoses modified

## 2018-07-07 ENCOUNTER — Ambulatory Visit (INDEPENDENT_AMBULATORY_CARE_PROVIDER_SITE_OTHER): Payer: Self-pay | Admitting: Physician Assistant

## 2018-07-07 ENCOUNTER — Encounter (INDEPENDENT_AMBULATORY_CARE_PROVIDER_SITE_OTHER): Payer: Self-pay | Admitting: Physician Assistant

## 2018-07-07 DIAGNOSIS — G5601 Carpal tunnel syndrome, right upper limb: Secondary | ICD-10-CM

## 2018-07-07 NOTE — Progress Notes (Signed)
   Post-Op Visit Note   Patient: Amber Travis           Date of Birth: Oct 24, 1980           MRN: [id number] Visit Date: 07/07/2018 PCP: Claiborne Rigg, NP   Assessment & Plan:  Chief Complaint:  Chief Complaint  Patient presents with  . Right Wrist - Pain   Visit Diagnoses:  1. Right carpal tunnel syndrome     Plan: Patient is a pleasant 37 year old female who presents to our clinic today following right carpal tunnel release, date of surgery 06/28/2018.  She has been doing fairly well and has been taking ibuprofen for pain.  No fevers or chills.  She does note numbness to the tips of the long, ring and small fingers of the right hand.  Examination of her right wrist reveals a well-healing surgical incision with nylon sutures in place.  No evidence of infection.  Today, nylon sutures were removed and Steri-Strips applied.  The patient will continue to work on range of motion exercises.  We will provide her with a removable splint to wear out in public and with activity, although she has been instructed of no heavy lifting for another 2 weeks.  We will keep her out of work for another 2 weeks as she excessively uses her right hand at work.  She will follow-up with [place] in 2 weeks time for recheck.  Follow-Up Instructions: Return in about 2 weeks (around 07/21/2018).   Orders:  No orders of the defined types were placed in this encounter.  No orders of the defined types were placed in this encounter.   Imaging: No new imaging  PMFS History: Patient Active Problem List   Diagnosis Date Noted  . Right carpal tunnel syndrome 06/28/2018  . Pelvic pain 06/13/2017  . 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 Breast lump in female 02/04/2015  . DENTAL CARIES 10/07/2010  . HYPERBILIRUBINEMIA 07/09/2010  . SKIN RASH 05/14/2010  . ANXIETY DEPRESSION 05/04/2010  . FIBROCYSTIC BREAST DISEASE 05/04/2010  . UNSPECIFIED ADJUSTMENT REACTION 04/17/2009  . MIGRAINE HEADACHE  01/27/2009  . Depression 06/11/2008   Past Medical History:  Diagnosis Date  . Carpal tunnel syndrome, right 06/2018  . Depression     Family History  Problem Relation Age of Onset  . Diabetes Mother     Past Surgical History:  Procedure Laterality Date  . CARPAL TUNNEL RELEASE Right 06/28/2018   Procedure: RIGHT CARPAL TUNNEL RELEASE;  Surgeon: Tarry Kos, MD;  Location: [facility];  Service: Orthopedics;  Laterality: Right;  . LAPAROSCOPIC APPENDECTOMY N/A 05/10/2016   Procedure: APPENDECTOMY LAPAROSCOPIC;  Surgeon: Jimmye Norman, MD;  Location: [facility] OR;  Service: General;  Laterality: N/A;   Social History   Occupational History  . Not on file  Tobacco Use  . Smoking status: Never Smoker  . Smokeless tobacco: Never Used  Substance and Sexual Activity  . Alcohol use: No  . Drug use: No  . Sexual activity: Yes    Birth control/protection: None

## 2018-07-13 MED FILL — hydrOXYzine HCL 25 MG TABS: 25 | 20 days supply | Qty: 60 | Fill #1

## 2018-07-19 ENCOUNTER — Other Ambulatory Visit: Payer: Self-pay | Admitting: Physician Assistant

## 2018-07-19 DIAGNOSIS — G8929 Other chronic pain: Secondary | ICD-10-CM

## 2018-07-19 DIAGNOSIS — M545 Low back pain, unspecified: Secondary | ICD-10-CM

## 2018-07-21 ENCOUNTER — Ambulatory Visit (INDEPENDENT_AMBULATORY_CARE_PROVIDER_SITE_OTHER): Payer: Self-pay | Admitting: Orthopaedic Surgery

## 2018-07-21 DIAGNOSIS — G5601 Carpal tunnel syndrome, right upper limb: Secondary | ICD-10-CM

## 2018-07-21 NOTE — Progress Notes (Signed)
   Post-Op Visit Note   Patient: Amber SawyersMarisol E Garcia           Date of Birth: Aug 07, 1981           MRN: [id number] Visit Date: 07/21/2018 PCP: Claiborne RiggFleming, Zelda W, NP   Assessment & Plan:  Chief Complaint:  Chief Complaint  Patient presents with  . Right Hand - Follow-up   Visit Diagnoses:  1. Right carpal tunnel syndrome     Plan: Patient is 3 weeks status post right carpal tunnel release.  She comes in today just for a surgical wound check.  She is concerned that the incision is not completely healed.  On examination her wound healed quite a bit.  Just the top layer of the glabra skin has not completely heal.  Reassurance was given that this is a normal appearance.  There is no evidence of infection.  I recommend continued Bactroban ointment and Band-Aid once a day.  Recheck in 3 weeks.  Follow-Up Instructions: Return in about 3 weeks (around 08/11/2018).   Orders:  No orders of the defined types were placed in this encounter.  No orders of the defined types were placed in this encounter.   Imaging: No results found.  PMFS History: Patient Active Problem List   Diagnosis Date Noted  . Right carpal tunnel syndrome 06/28/2018  . Pelvic pain 06/13/2017  . 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 Breast lump in female 02/04/2015  . DENTAL CARIES 10/07/2010  . HYPERBILIRUBINEMIA 07/09/2010  . SKIN RASH 05/14/2010  . ANXIETY DEPRESSION 05/04/2010  . FIBROCYSTIC BREAST DISEASE 05/04/2010  . UNSPECIFIED ADJUSTMENT REACTION 04/17/2009  . MIGRAINE HEADACHE 01/27/2009  . Depression 06/11/2008   Past Medical History:  Diagnosis Date  . Carpal tunnel syndrome, right 06/2018  . Depression     Family History  Problem Relation Age of Onset  . Diabetes Mother     Past Surgical History:  Procedure Laterality Date  . CARPAL TUNNEL RELEASE Right 06/28/2018   Procedure: RIGHT CARPAL TUNNEL RELEASE;  Surgeon: Tarry KosXu,  M, MD;  Location: [facility];   Service: Orthopedics;  Laterality: Right;  . LAPAROSCOPIC APPENDECTOMY N/A 05/10/2016   Procedure: APPENDECTOMY LAPAROSCOPIC;  Surgeon: Jimmye NormanJames Wyatt, MD;  [facility];  Service: General;  Laterality: N/A;   Social History   Occupational History  . Not on file  Tobacco Use  . Smoking status: Never Smoker  . Smokeless tobacco: Never Used  Substance and Sexual Activity  . Alcohol use: No  . Drug use: No  . Sexual activity: Yes    Birth control/protection: None

## 2018-08-01 ENCOUNTER — Telehealth: Payer: Self-pay | Admitting: Nurse Practitioner

## 2018-08-01 ENCOUNTER — Telehealth (INDEPENDENT_AMBULATORY_CARE_PROVIDER_SITE_OTHER): Payer: Self-pay

## 2018-08-01 NOTE — Telephone Encounter (Signed)
Spoke with Pt at the office about the bills

## 2018-08-01 NOTE — Telephone Encounter (Signed)
Patient would like to get an update on the bills she brought in. Please follow up with patient.

## 2018-08-01 NOTE — Telephone Encounter (Signed)
That's fine.  She can come in sooner

## 2018-08-01 NOTE — Telephone Encounter (Signed)
Patient left voicemail stating that she needs sooner appt than 08/11/18. States she is having pain. Would like something for pain.    CB [phone]

## 2018-08-02 ENCOUNTER — Other Ambulatory Visit (INDEPENDENT_AMBULATORY_CARE_PROVIDER_SITE_OTHER): Payer: Self-pay

## 2018-08-02 MED ORDER — TRAMADOL HCL 50 MG PO TABS: 50.0000 mg | ORAL_TABLET | Freq: Two times a day (BID) | ORAL | 0 refills | Status: DC

## 2018-08-02 NOTE — Telephone Encounter (Signed)
RX CALLED INTO PHARM. PATIENT AWARE. MADE APPT FOR HER TO COME IN SOONER

## 2018-08-02 NOTE — Telephone Encounter (Signed)
Do you want to prescribe something for pain in the mean time?

## 2018-08-02 NOTE — Telephone Encounter (Signed)
Tramadol #30

## 2018-08-10 ENCOUNTER — Ambulatory Visit (INDEPENDENT_AMBULATORY_CARE_PROVIDER_SITE_OTHER): Payer: Self-pay | Admitting: Physician Assistant

## 2018-08-10 ENCOUNTER — Encounter (INDEPENDENT_AMBULATORY_CARE_PROVIDER_SITE_OTHER): Payer: Self-pay | Admitting: Orthopaedic Surgery

## 2018-08-10 ENCOUNTER — Ambulatory Visit (INDEPENDENT_AMBULATORY_CARE_PROVIDER_SITE_OTHER): Payer: Self-pay | Admitting: Orthopaedic Surgery

## 2018-08-10 DIAGNOSIS — Z9889 Other specified postprocedural states: Secondary | ICD-10-CM | POA: Insufficient documentation

## 2018-08-10 NOTE — Progress Notes (Signed)
   Post-Op Visit Note   Patient: Amber SawyersMarisol E Travis           Date of Birth: [birth year]           MRN: [id number] Visit Date: 08/10/2018 PCP: Claiborne RiggFleming, Zelda W, NP   Assessment & Plan:  Chief Complaint:  Chief Complaint  Patient presents with  . Right Hand - Routine Post Op, Follow-up   Visit Diagnoses:  1. S/P carpal tunnel release     Plan: Patient is a pleasant 37 year old female who presents to our clinic today 43 days status post right carpal tunnel release, date of surgery 06/28/2018.  She has been doing fairly well although she does admit to some pain which is relieved with tramadol.  No fevers or chills.  Examination of her right hand reveals a well-healed surgical incision without evidence of infection or cellulitis.  Full sensation distally.  At this point, she can increase activity as tolerated.  No restrictions at this point.  Follow-up with us as needed.  Follow-Up Instructions: Return if symptoms worsen or fail to improve.   Orders:  No orders of the defined types were placed in this encounter.  No orders of the defined types were placed in this encounter.   Imaging: No new imaging  PMFS History: Patient Active Problem List   Diagnosis Date Noted  . S/P carpal tunnel release 08/10/2018  . Right carpal tunnel syndrome 06/28/2018  . Pelvic pain 06/13/2017  . 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 Breast lump in female 02/04/2015  . DENTAL CARIES 10/07/2010  . HYPERBILIRUBINEMIA 07/09/2010  . SKIN RASH 05/14/2010  . ANXIETY DEPRESSION 05/04/2010  . FIBROCYSTIC BREAST DISEASE 05/04/2010  . UNSPECIFIED ADJUSTMENT REACTION 04/17/2009  . MIGRAINE HEADACHE 01/27/2009  . Depression 06/11/2008   Past Medical History:  Diagnosis Date  . Carpal tunnel syndrome, right 06/2018  . Depression     Family History  Problem Relation Age of Onset  . Diabetes Mother     Past Surgical History:  Procedure Laterality Date  . CARPAL TUNNEL RELEASE Right  06/28/2018   Procedure: RIGHT CARPAL TUNNEL RELEASE;  Surgeon: Tarry KosXu, Naiping M, MD;  Location: [facility];  Service: Orthopedics;  Laterality: Right;  . LAPAROSCOPIC APPENDECTOMY N/A 05/10/2016   Procedure: APPENDECTOMY LAPAROSCOPIC;  Surgeon: Jimmye NormanJames Wyatt, MD;  [facility];  Service: General;  Laterality: N/A;   Social History   Occupational History  . Not on file  Tobacco Use  . Smoking status: Never Smoker  . Smokeless tobacco: Never Used  Substance and Sexual Activity  . Alcohol use: No  . Drug use: No  . Sexual activity: Yes    Birth control/protection: None

## 2018-08-11 ENCOUNTER — Ambulatory Visit (INDEPENDENT_AMBULATORY_CARE_PROVIDER_SITE_OTHER): Payer: Self-pay | Admitting: Orthopaedic Surgery

## 2018-08-18 ENCOUNTER — Ambulatory Visit: Payer: Self-pay | Attending: Nurse Practitioner | Admitting: Nurse Practitioner

## 2018-08-18 VITALS — BP 106/71 | HR 71 | Temp 98.3°F | Ht 59.0 in | Wt 178.4 lb

## 2018-08-18 DIAGNOSIS — Z833 Family history of diabetes mellitus: Secondary | ICD-10-CM | POA: Insufficient documentation

## 2018-08-18 DIAGNOSIS — N644 Mastodynia: Secondary | ICD-10-CM

## 2018-08-18 DIAGNOSIS — G43C0 Periodic headache syndromes in child or adult, not intractable: Secondary | ICD-10-CM

## 2018-08-18 DIAGNOSIS — Z79899 Other long term (current) drug therapy: Secondary | ICD-10-CM | POA: Insufficient documentation

## 2018-08-18 DIAGNOSIS — F419 Anxiety disorder, unspecified: Secondary | ICD-10-CM

## 2018-08-18 MED ORDER — HYDROXYZINE HCL 50 MG PO TABS: 50.0000 mg | ORAL_TABLET | Freq: Three times a day (TID) | ORAL | 1 refills | Status: DC | PRN

## 2018-08-18 MED ORDER — BUTALBITAL-APAP-CAFFEINE 50-325-40 MG PO TABS: 1.0000 | ORAL_TABLET | Freq: Four times a day (QID) | ORAL | 1 refills | Status: AC | PRN

## 2018-08-18 MED FILL — BUTALB-ACETAMIN-CAFF 50-325: 50-325-40 | 2 days supply | Qty: 30 | Fill #0

## 2018-08-18 MED FILL — hydrOXYzine HCL 50 MG TABS: 50 | 20 days supply | Qty: 60 | Fill #0

## 2018-08-18 NOTE — Progress Notes (Signed)
Assessment & Plan:  Shekita was seen today for headache.  Diagnoses and all orders for this visit:  Breast pain Referred to the [facility] program  Anxiety -     hydrOXYzine (ATARAX/VISTARIL) 50 MG tablet; Take 1 tablet (50 mg total) by mouth 3 (three) times daily as needed for anxiety.  Periodic headache syndrome, not intractable -     butalbital-acetaminophen-caffeine (FIORICET, ESGIC) 50-325-40 MG tablet; Take 1-2 tablets by mouth every 6 (six) hours as needed for headache.    Patient has been counseled on age-appropriate routine health concerns for screening and prevention. These are reviewed and up-to-date. Referrals have been placed accordingly. Immunizations are up-to-date or declined.    Subjective:   Chief Complaint  Patient presents with  . Headache   HPI Amber Travis 37 y.o. female presents to office today with complaints of re occurring migraine headaches.  She has a headache today and endorses a chronic history of migraines.  She has been taking Excedrin migraine (1 tablet) at the onset of migraines which has not helped to relieve her pain. She also endorses nausea and vomiting with headaches. Denies any visual disturbances or stroke like symptoms.  Headaches are occipital.  She denies any auras, visual disturbances or vomiting.   Anxiety She states her anxiety is causing her to overeat. She has tried buspar and low dose hydroxyzine neither which have provided much relief of her anxiety. She declines SSRI and reports they have caused decrease in libido in the past She is agreeable to increasing her hydroxyzine. I have encouraged her to to eat healthy foods instead of junk foods when she stress eats and to find other ways to relieve her anxiety such as distraction such as walking and exercising.  GAD 7 : Generalized Anxiety Score 08/18/2018 06/07/2018 05/29/2018 05/22/2018  Nervous, Anxious, on Edge 2 3 1 2   Control/stop worrying 1 3 2 1   Worry too much - different  things 3 1 3 1   Trouble relaxing 2 3 2 2   Restless 1 1 1 1   Easily annoyed or irritable 3 3 3 2   Afraid - awful might happen - 1 - -  Total GAD 7 Score - 15 - -    Breast Problem She endorses bilateral breast pain. Pain is constant and described as sharp and stabbing. She denies any palpable masses, drainage or skin changes.    Review of Systems  Constitutional: Negative for fever, malaise/fatigue and weight loss.  HENT: Negative.  Negative for nosebleeds.   Eyes: Negative.  Negative for blurred vision, double vision and photophobia.  Respiratory: Negative.  Negative for cough and shortness of breath.   Cardiovascular: Negative.  Negative for chest pain, palpitations and leg swelling.  Gastrointestinal: Negative.  Negative for heartburn, nausea and vomiting.  Musculoskeletal: Negative.  Negative for myalgias.  Neurological: Positive for headaches. Negative for dizziness, focal weakness and seizures.  Psychiatric/Behavioral: Negative for suicidal ideas. The patient is nervous/anxious.     Past Medical History:  Diagnosis Date  . Carpal tunnel syndrome, right 06/2018  . Depression     Past Surgical History:  Procedure Laterality Date  . CARPAL TUNNEL RELEASE Right 06/28/2018   Procedure: RIGHT CARPAL TUNNEL RELEASE;  Surgeon: Tarry Kos, MD;  Location: [facility];  Service: Orthopedics;  Laterality: Right;  . LAPAROSCOPIC APPENDECTOMY N/A 05/10/2016   Procedure: APPENDECTOMY LAPAROSCOPIC;  Surgeon: Jimmye Norman, MD;  Location: [facility] OR;  Service: General;  Laterality: N/A;    Family History  Problem Relation Age of Onset  . Diabetes Mother     Social History Reviewed with no changes to be made today.   Outpatient Medications Prior to Visit  Medication Sig Dispense Refill  . cetirizine (ZYRTEC) 10 MG tablet Take 1 tablet (10 mg total) by mouth daily. 30 tablet 11  . fluticasone (FLONASE) 50 MCG/ACT nasal spray Place 2 sprays into both nostrils daily. 16 g 6    . Multiple Vitamin (MULTIVITAMIN) tablet Take 1 tablet by mouth daily.    . ondansetron (ZOFRAN) 4 MG tablet Take 1-2 tablets (4-8 mg total) by mouth every 8 (eight) hours as needed for nausea or vomiting. (Patient not taking: Reported on 08/18/2018) 40 tablet 0  . traMADol (ULTRAM) 50 MG tablet Take 1 tablet (50 mg total) by mouth 2 (two) times daily. (Patient not taking: Reported on 08/18/2018) 30 tablet 0  . HYDROcodone-acetaminophen (NORCO) 5-325 MG tablet Take 1-2 tablets by mouth every 6 (six) hours as needed. (Patient not taking: Reported on 08/18/2018) 30 tablet 0  . ibuprofen (ADVIL,MOTRIN) 600 MG tablet Take 600 mg by mouth every 6 (six) hours as needed.    Marland Kitchen ibuprofen (ADVIL,MOTRIN) 600 MG tablet Take 1 tablet (600 mg total) by mouth 3 (three) times daily. (Patient not taking: Reported on 08/18/2018) 60 tablet 0   No facility-administered medications prior to visit.     No Known Allergies     Objective:    BP 106/71   Pulse 71   Temp 98.3 F (36.8 C) (Oral)   Ht 4\' 11"  (1.499 m)   Wt 178 lb 6.4 oz (80.9 kg)   SpO2 98%   BMI 36.03 kg/m  Wt Readings from Last 3 Encounters:  08/18/18 178 lb 6.4 oz (80.9 kg)  06/28/18 176 lb 9.4 oz (80.1 kg)  06/07/18 178 lb 6.4 oz (80.9 kg)    Physical Exam Vitals signs and nursing note reviewed.  Constitutional:      Appearance: She is well-developed.  HENT:     Head: Normocephalic and atraumatic.  Neck:     Musculoskeletal: Normal range of motion.  Cardiovascular:     Rate and Rhythm: Normal rate and regular rhythm.     Heart sounds: Normal heart sounds. No murmur. No friction rub. No gallop.   Pulmonary:     Effort: Pulmonary effort is normal. No tachypnea or respiratory distress.     Breath sounds: Normal breath sounds. No decreased breath sounds, wheezing, rhonchi or rales.  Chest:     Chest wall: No tenderness.     Breasts:        Right: No swelling, bleeding, mass, nipple discharge, skin change or tenderness.         Left: No swelling, bleeding, mass, nipple discharge, skin change or tenderness.  Abdominal:     General: Bowel sounds are normal.     Palpations: Abdomen is soft.  Musculoskeletal: Normal range of motion.  Lymphadenopathy:     Upper Body:     Right upper body: No axillary adenopathy.     Left upper body: No axillary adenopathy.  Skin:    General: Skin is warm and dry.  Neurological:     Mental Status: She is alert and oriented to person, place, and time.     Coordination: Coordination normal.  Psychiatric:        Behavior: Behavior normal. Behavior is cooperative.        Thought Content: Thought content normal.  Judgment: Judgment normal.          Patient has been counseled extensively about nutrition and exercise as well as the importance of adherence with medications and regular follow-up. The patient was given clear instructions to go to ER or return to medical center if symptoms don't improve, worsen or new problems develop. The patient verbalized understanding.   Follow-up: Return in about 4 weeks (around 09/15/2018) for migaines, anxiety.   Claiborne RiggZelda W Corene Resnick, FNP-BC [facility] [place], [place] [phone]   08/23/2018, 9:56 PM

## 2018-08-21 ENCOUNTER — Ambulatory Visit: Payer: Self-pay | Attending: Nurse Practitioner

## 2018-08-23 ENCOUNTER — Encounter: Payer: Self-pay | Admitting: Nurse Practitioner

## 2018-08-31 ENCOUNTER — Telehealth (INDEPENDENT_AMBULATORY_CARE_PROVIDER_SITE_OTHER): Payer: Self-pay | Admitting: Orthopaedic Surgery

## 2018-08-31 NOTE — Telephone Encounter (Signed)
Numbness can sometimes last for a 6 months after surgery sometimes.  As long as she's still able to use her hand and feel with her finger tips, it's normal to some numbness still.  I would recommend wearing her brace at night if she's not already doing so.  We can also consider another injection also if she's interested.

## 2018-08-31 NOTE — Telephone Encounter (Signed)
Translator [phone] used for this communication:    Patient calling because she is experiencing numbness in her fingertips.  She is concerned because she is one month post op and was expecting the numbness to have gone away by now.  She states she does not have a fever nor does the incision look infected, only feeling numbness.    Please call and advise  [phone]

## 2018-08-31 NOTE — Telephone Encounter (Signed)
See message below  Please advise

## 2018-09-01 NOTE — Telephone Encounter (Signed)
Called patient to advise. She would like to come in to get an injection. States she has not had an inj and would like to see if it'll help. Made appt for 09/12/17.

## 2018-09-04 ENCOUNTER — Other Ambulatory Visit (HOSPITAL_COMMUNITY): Payer: Self-pay | Admitting: *Deleted

## 2018-09-04 DIAGNOSIS — N644 Mastodynia: Secondary | ICD-10-CM

## 2018-09-12 ENCOUNTER — Ambulatory Visit (INDEPENDENT_AMBULATORY_CARE_PROVIDER_SITE_OTHER): Payer: Self-pay | Admitting: Orthopaedic Surgery

## 2018-09-18 MED FILL — hydrOXYzine HCL 50 MG TABS: 50 | 20 days supply | Qty: 60 | Fill #1

## 2018-09-19 ENCOUNTER — Ambulatory Visit: Payer: Self-pay | Admitting: Nurse Practitioner

## 2018-09-19 ENCOUNTER — Ambulatory Visit (INDEPENDENT_AMBULATORY_CARE_PROVIDER_SITE_OTHER): Payer: Self-pay | Admitting: Orthopaedic Surgery

## 2018-09-19 ENCOUNTER — Encounter (INDEPENDENT_AMBULATORY_CARE_PROVIDER_SITE_OTHER): Payer: Self-pay | Admitting: Orthopaedic Surgery

## 2018-09-19 DIAGNOSIS — Z9889 Other specified postprocedural states: Secondary | ICD-10-CM

## 2018-09-19 MED ORDER — BUPIVACAINE HCL 0.5 % IJ SOLN
1.0000 mL | INTRAMUSCULAR | Status: AC | PRN
  Administered 2018-09-19: 1 mL

## 2018-09-19 MED ORDER — LIDOCAINE HCL 1 % IJ SOLN
1.0000 mL | INTRAMUSCULAR | Status: AC | PRN
  Administered 2018-09-19: 1 mL

## 2018-09-19 MED ORDER — METHYLPREDNISOLONE ACETATE 40 MG/ML IJ SUSP
40.0000 mg | INTRAMUSCULAR | Status: AC | PRN
  Administered 2018-09-19: 40 mg

## 2018-09-19 NOTE — Progress Notes (Signed)
Post-Op Visit Note   Patient: Amber Travis           Date of Birth: 1981/01/09           MRN: [id number] Visit Date: 09/19/2018 PCP: Claiborne Rigg, NP   Assessment & Plan:  Chief Complaint:  Chief Complaint  Patient presents with  . Right Hand - Pain, Follow-up   Visit Diagnoses:  1. S/P carpal tunnel release     Plan: Amber Travis is 83 days status post left carpal tunnel release.  She states that her pain is improved and that it no longer radiates up the arm.  The numbness and pain in her hand and fingers are mildly improved.  She still has trouble with lack of hand strength and cannot cut an orange.  Her surgical scar is fully healed.  No neurovascular compromise.  From my standpoint I think she is still having some residual carpal tunnel symptoms.  We provided her with a cortisone injection today.  We also made a referral to hand therapy.  I would like to recheck her in 4 weeks to see her progress.  Procedure Note  Patient: Amber Travis             Date of Birth: 1981/01/21           MRN: [id number]             Visit Date: 09/19/2018  Procedures: Visit Diagnoses: S/P carpal tunnel release  Hand/UE Inj: R carpal tunnel for carpal tunnel syndrome on 09/19/2018 9:41 AM Indications: pain Details: 25 G needle Medications: 1 mL lidocaine 1 %; 1 mL bupivacaine 0.5 %; 40 mg methylPREDNISolone acetate 40 MG/ML Outcome: tolerated well, no immediate complications Patient was prepped and draped in the usual sterile fashion.        Follow-Up Instructions: Return in about 4 weeks (around 10/17/2018).   Orders:  No orders of the defined types were placed in this encounter.  No orders of the defined types were placed in this encounter.   Imaging: No results found.  PMFS History: Patient Active Problem List   Diagnosis Date Noted  . S/P carpal tunnel release 08/10/2018  . Right carpal tunnel syndrome 06/28/2018  . Pelvic pain 06/13/2017  .  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 Breast lump in female 02/04/2015  . DENTAL CARIES 10/07/2010  . HYPERBILIRUBINEMIA 07/09/2010  . SKIN RASH 05/14/2010  . ANXIETY DEPRESSION 05/04/2010  . FIBROCYSTIC BREAST DISEASE 05/04/2010  . UNSPECIFIED ADJUSTMENT REACTION 04/17/2009  . MIGRAINE HEADACHE 01/27/2009  . Depression 06/11/2008   Past Medical History:  Diagnosis Date  . Carpal tunnel syndrome, right 06/2018  . Depression     Family History  Problem Relation Age of Onset  . Diabetes Mother     Past Surgical History:  Procedure Laterality Date  . CARPAL TUNNEL RELEASE Right 06/28/2018   Procedure: RIGHT CARPAL TUNNEL RELEASE;  Surgeon: Tarry Kos, MD;  Location: [facility];  Service: Orthopedics;  Laterality: Right;  . LAPAROSCOPIC APPENDECTOMY N/A 05/10/2016   Procedure: APPENDECTOMY LAPAROSCOPIC;  Surgeon: Jimmye Norman, MD;  Location: [facility] OR;  Service: General;  Laterality: N/A;   Social History   Occupational History  . Not on file  Tobacco Use  . Smoking status: Never Smoker  . Smokeless tobacco: Never Used  Substance and Sexual Activity  . Alcohol use: No  . Drug use: No  . Sexual activity: Yes    Birth  control/protection: None

## 2018-09-28 ENCOUNTER — Encounter (HOSPITAL_COMMUNITY): Payer: Self-pay

## 2018-09-28 ENCOUNTER — Ambulatory Visit: Payer: Self-pay

## 2018-09-28 ENCOUNTER — Ambulatory Visit
Admission: RE | Admit: 2018-09-28 | Discharge: 2018-09-28 | Disposition: A | Discharge status: Home / Self Care | Payer: No Typology Code available for payment source | Source: Ambulatory Visit | Attending: Obstetrics and Gynecology | Admitting: Obstetrics and Gynecology

## 2018-09-28 ENCOUNTER — Ambulatory Visit (HOSPITAL_COMMUNITY)
Admission: RE | Admit: 2018-09-28 | Discharge: 2018-09-28 | Disposition: A | Discharge status: Home / Self Care | Payer: Self-pay | Source: Ambulatory Visit | Attending: Obstetrics and Gynecology | Admitting: Obstetrics and Gynecology

## 2018-09-28 ENCOUNTER — Encounter (HOSPITAL_COMMUNITY): Payer: Self-pay | Admitting: *Deleted

## 2018-09-28 VITALS — BP 128/74 | Ht 62.0 in | Wt 162.0 lb

## 2018-09-28 DIAGNOSIS — N644 Mastodynia: Secondary | ICD-10-CM

## 2018-09-28 DIAGNOSIS — Z1239 Encounter for other screening for malignant neoplasm of breast: Secondary | ICD-10-CM

## 2018-09-28 IMAGING — MG DIGITAL DIAGNOSTIC BILATERAL MAMMOGRAM WITH TOMO AND CAD
4 series · 4 of 8 positions shown · non-contrast
Comparison: Previous exam(s).

CLINICAL DATA: 37-year-old female with bilateral diffuse breast
pain.

EXAM:
DIGITAL DIAGNOSTIC BILATERAL MAMMOGRAM WITH CAD AND TOMO

[R CC synth-2D]
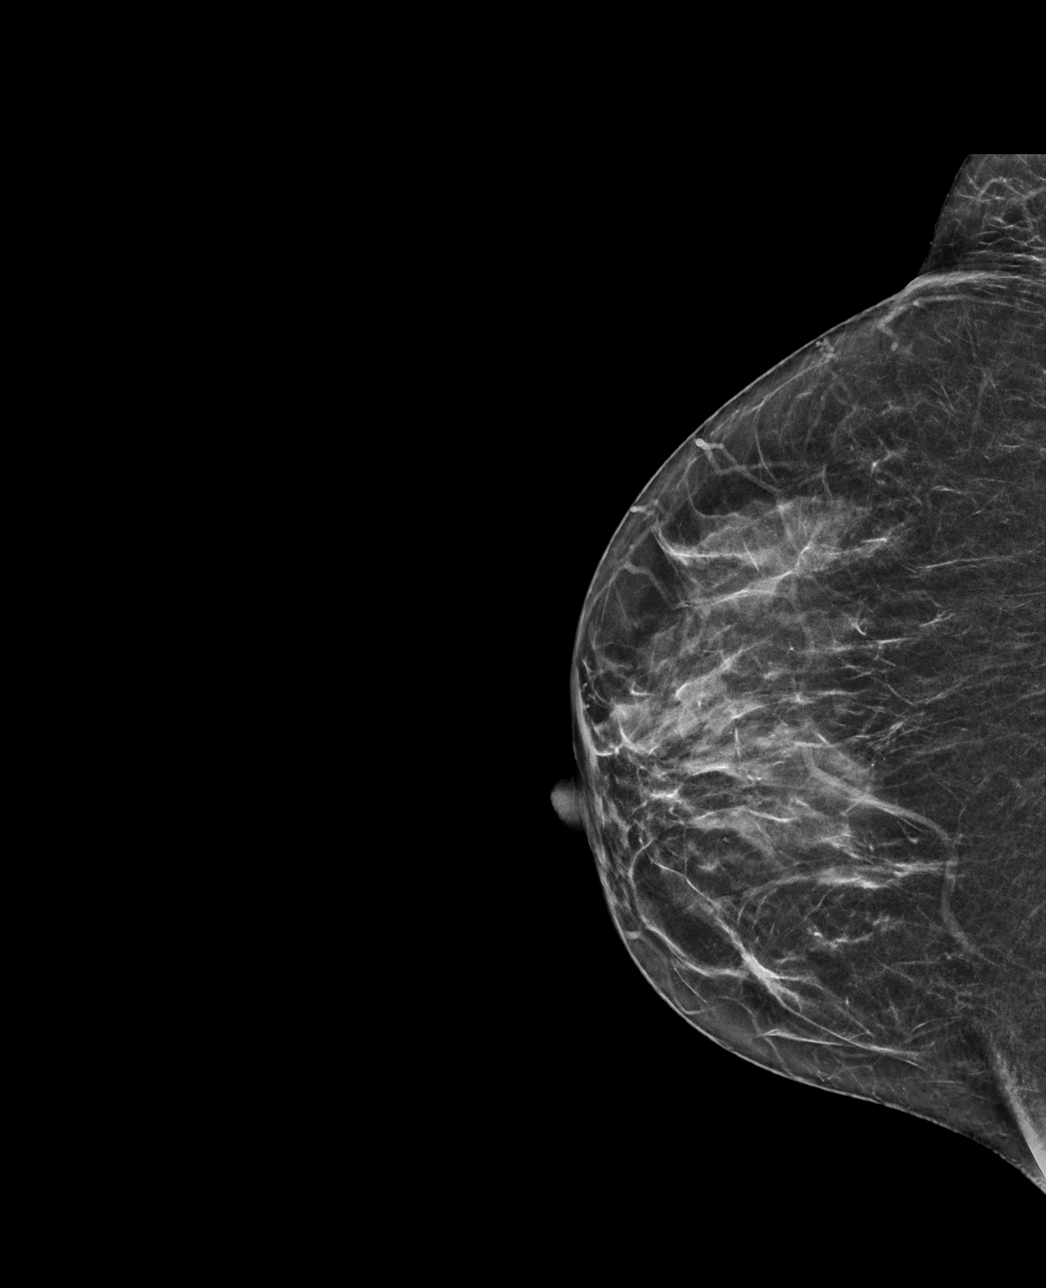

[L MLO synth-2D]
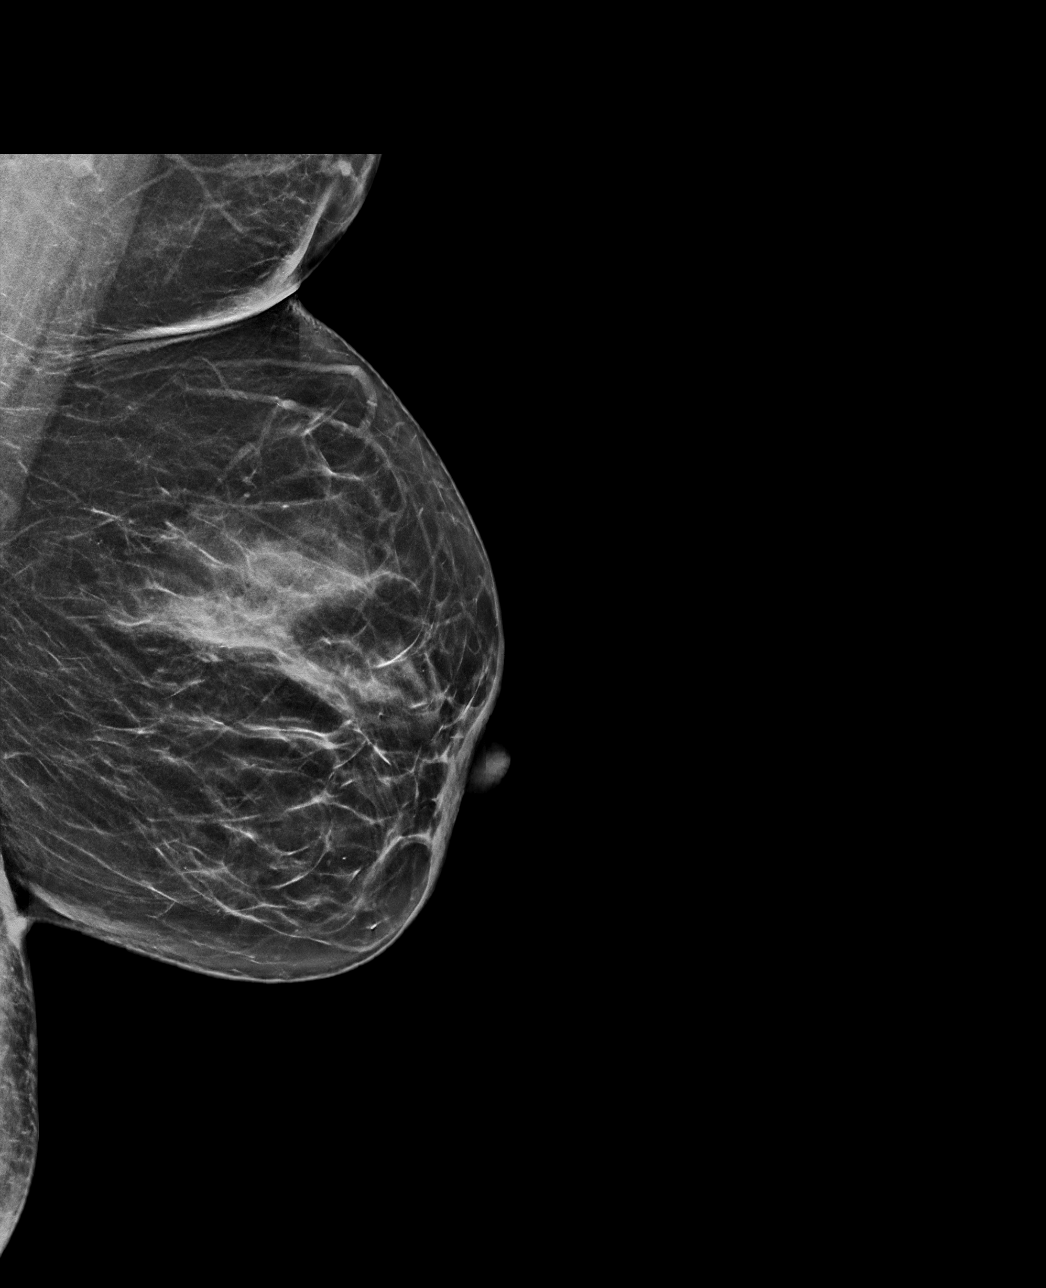

[L CC synth-2D]
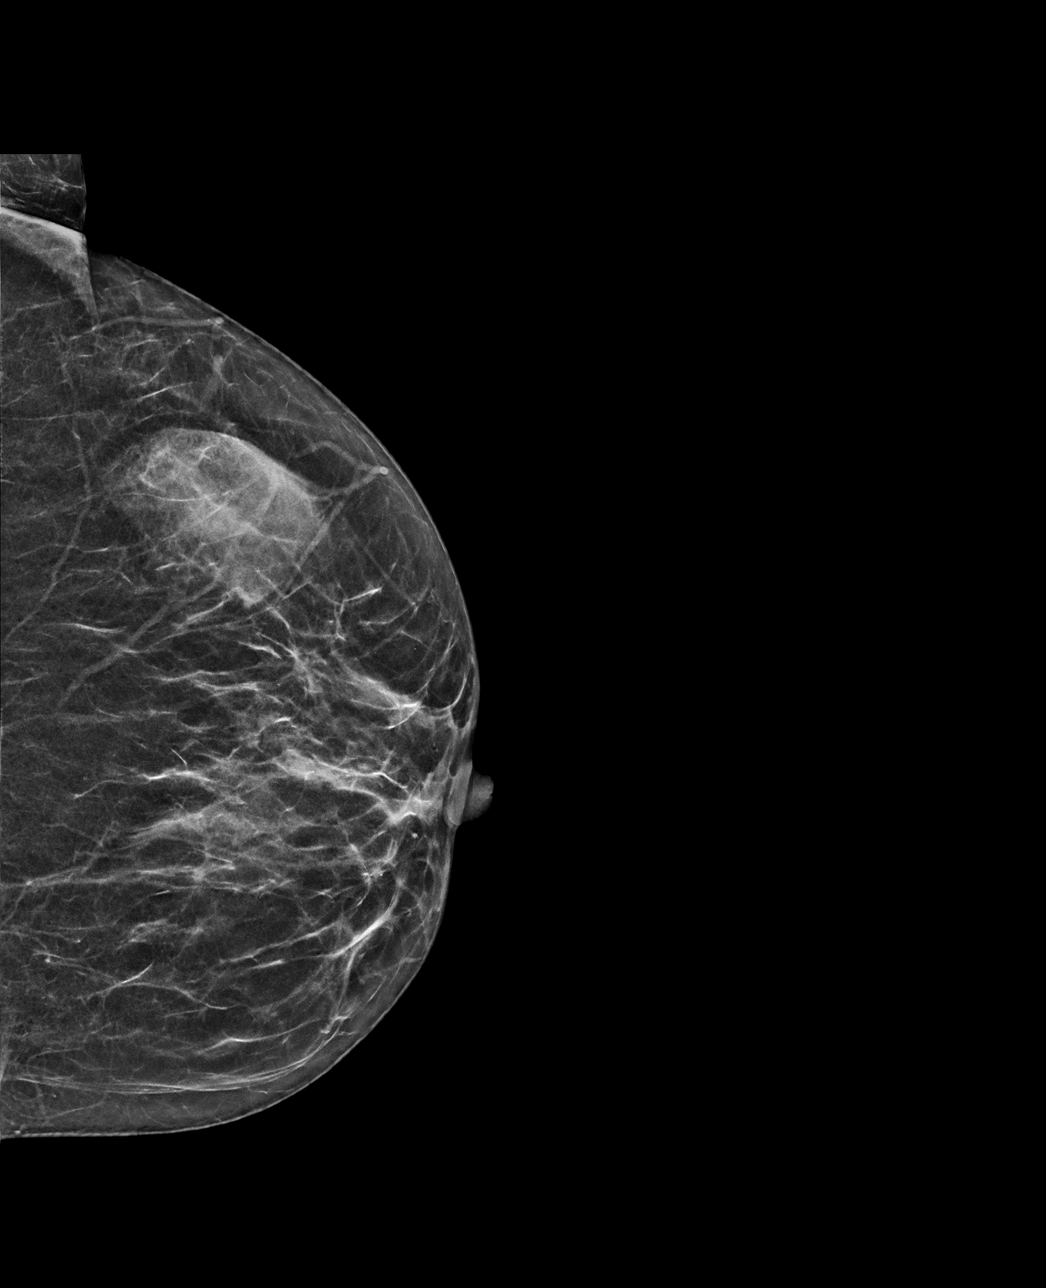

[R MLO tomo · tomo slice 29/58.0]
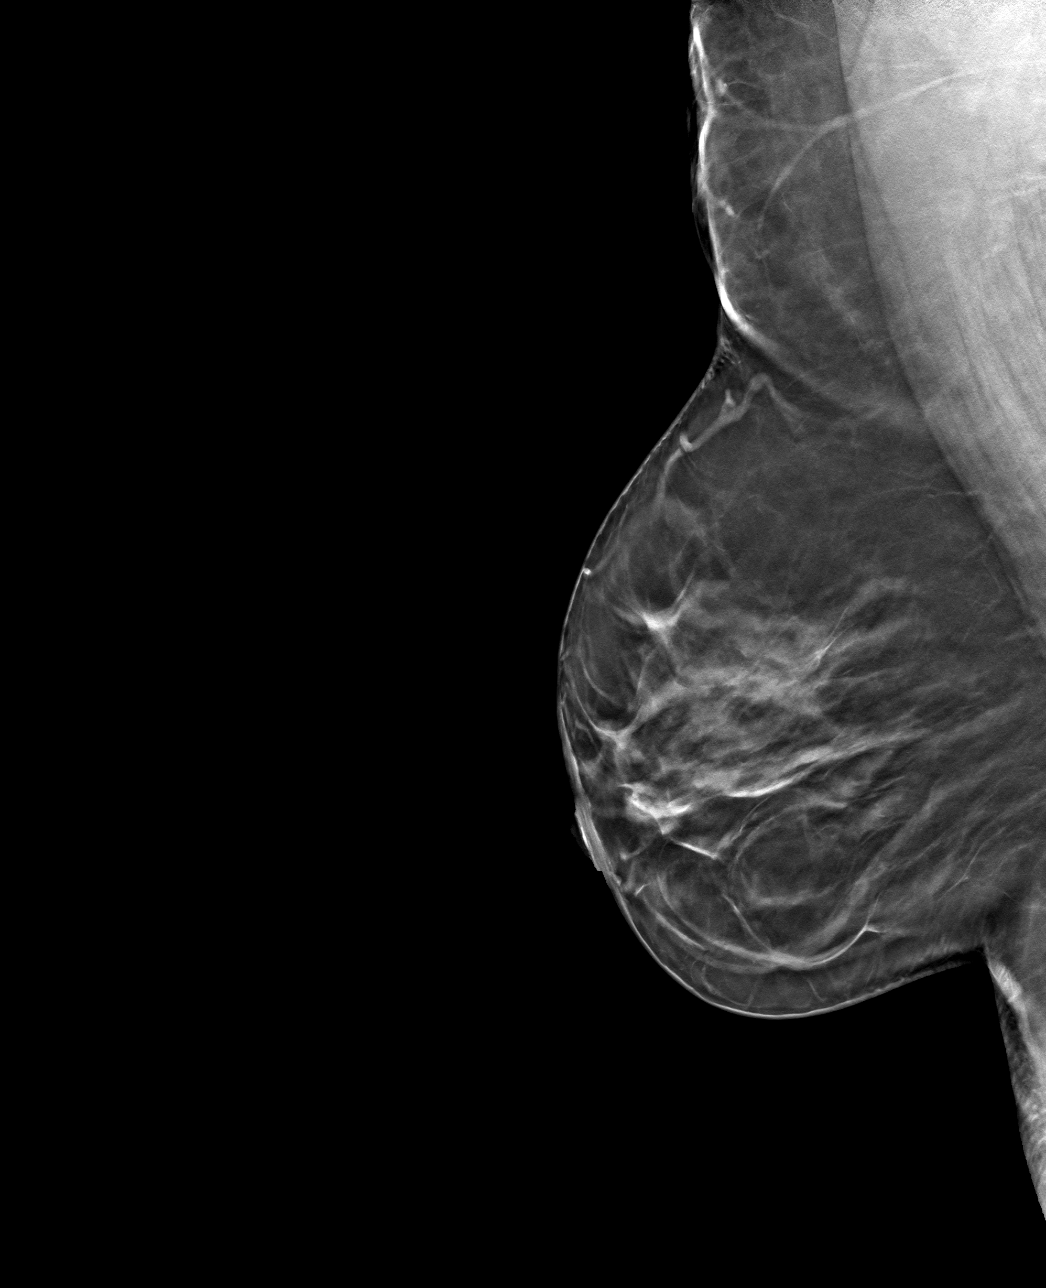

[4 of 8 positions shown; findings below may reference images not displayed]

ACR Breast Density Category c: The breast tissue is heterogeneously
dense, which may obscure small masses.
FINDINGS: No suspicious masses or calcifications are seen in either breast.
There is no mammographic evidence of malignancy in either breast.

Mammographic images were processed with CAD.
IMPRESSION: No mammographic evidence of malignancy in either breast.

RECOMMENDATION:
1. Recommend further management of nonfocal bilateral breast pain be
based on clinical assessment.

2. Screening mammogram at age 40 unless there are persistent or
intervening clinical concerns. (Code:[QM])

I have discussed the findings and recommendations with the patient.
Results were also provided in writing at the conclusion of the
visit. If applicable, a reminder letter will be sent to the patient
regarding the next appointment.

BI-RADS CATEGORY  1: Negative.

## 2018-09-28 NOTE — Progress Notes (Signed)
Complaints of bilateral outer breast lumps and pain x 5-6 years. Patient stated the pain is constant. Patient rates the pain at a 6-7 out of 10.  Pap Smear: Pap smear not completed today. Last Pap smear was 37/31/2019 at [facility] and normal. Per patient has no history of an abnormal Pap smear. Last three Pap smear results are in EPIC.  Physical exam: Breasts Breasts symmetrical. No skin abnormalities bilateral breasts. No nipple retraction bilateral breasts. No nipple discharge bilateral breasts. No lymphadenopathy. No lumps palpated bilateral breasts. Unable to palpate any lumps in patients area of concern. Complaints of bilateral outer breast tenderness on exam. Referred patient to the [facility] for a diagnostic mammogram. Appointment scheduled for Thursday, September 28, 2018 at 1120.      Pelvic/Bimanual No Pap smear completed today since last Pap smear was 04/05/2018. Pap smear not indicated per BCCCP guidelines.   Smoking History: Patient has never smoked.  Patient Navigation: Patient education provided. Access to services provided for patient through [facility] program. Spanish interpreter provided.   Breast and Cervical Cancer Risk Assessment: Patient has no family history of breast cancer, known genetic mutations, or radiation treatment to the chest before age 31. Patient has no history of cervical dysplasia, immunocompromised, or DES exposure in-utero.  Risk Assessment    Risk Scores      09/28/2018   Last edited by: Priscille Heidelberg, RN   5-year risk: 0.2 %   Lifetime risk: 4.8 %         Used Spanish interpreter Natale Lay from [place].

## 2018-09-28 NOTE — Patient Instructions (Signed)
Explained breast self awareness with Amber Travis. Patient did not need a Pap smear today due to last Pap smear was 04/05/2018. Let her know BCCCP will cover Pap smears every 3 years unless has a history of abnormal Pap smears. Referred patient to the [facility] for a diagnostic mammogram. Appointment scheduled for Thursday, September 28, 2018 at 1120. Patient aware of appointment and will be there. [name] verbalized understanding.  Case Vassell, Kathaleen Maser, RN 9:36 AM

## 2018-10-12 ENCOUNTER — Ambulatory Visit (INDEPENDENT_AMBULATORY_CARE_PROVIDER_SITE_OTHER): Payer: Self-pay | Admitting: Otolaryngology

## 2018-10-16 ENCOUNTER — Other Ambulatory Visit: Payer: Self-pay | Admitting: Nurse Practitioner

## 2018-10-16 DIAGNOSIS — F419 Anxiety disorder, unspecified: Secondary | ICD-10-CM

## 2018-10-17 ENCOUNTER — Ambulatory Visit (INDEPENDENT_AMBULATORY_CARE_PROVIDER_SITE_OTHER): Payer: Self-pay | Admitting: Orthopaedic Surgery

## 2018-10-17 ENCOUNTER — Encounter: Payer: Self-pay | Admitting: Nurse Practitioner

## 2018-10-17 ENCOUNTER — Encounter (INDEPENDENT_AMBULATORY_CARE_PROVIDER_SITE_OTHER): Payer: Self-pay | Admitting: Orthopaedic Surgery

## 2018-10-17 ENCOUNTER — Ambulatory Visit: Payer: Self-pay | Attending: Nurse Practitioner | Admitting: Nurse Practitioner

## 2018-10-17 VITALS — Ht 62.0 in | Wt 162.0 lb

## 2018-10-17 VITALS — BP 116/81 | HR 81 | Temp 98.7°F | Ht 59.0 in | Wt 180.2 lb

## 2018-10-17 DIAGNOSIS — F419 Anxiety disorder, unspecified: Secondary | ICD-10-CM | POA: Insufficient documentation

## 2018-10-17 DIAGNOSIS — M79672 Pain in left foot: Secondary | ICD-10-CM | POA: Insufficient documentation

## 2018-10-17 DIAGNOSIS — M79671 Pain in right foot: Secondary | ICD-10-CM | POA: Insufficient documentation

## 2018-10-17 DIAGNOSIS — G5601 Carpal tunnel syndrome, right upper limb: Secondary | ICD-10-CM

## 2018-10-17 DIAGNOSIS — Z833 Family history of diabetes mellitus: Secondary | ICD-10-CM | POA: Insufficient documentation

## 2018-10-17 DIAGNOSIS — Z9889 Other specified postprocedural states: Secondary | ICD-10-CM

## 2018-10-17 DIAGNOSIS — G43709 Chronic migraine without aura, not intractable, without status migrainosus: Secondary | ICD-10-CM | POA: Insufficient documentation

## 2018-10-17 DIAGNOSIS — F329 Major depressive disorder, single episode, unspecified: Secondary | ICD-10-CM | POA: Insufficient documentation

## 2018-10-17 DIAGNOSIS — Z79899 Other long term (current) drug therapy: Secondary | ICD-10-CM | POA: Insufficient documentation

## 2018-10-17 MED ORDER — HYDROXYZINE HCL 50 MG PO TABS: 50.0000 mg | ORAL_TABLET | Freq: Three times a day (TID) | ORAL | 1 refills | Status: DC | PRN

## 2018-10-17 MED ORDER — BUTALBITAL-APAP-CAFFEINE 50-325-40 MG PO TABS: 1.0000 | ORAL_TABLET | Freq: Four times a day (QID) | ORAL | 1 refills | Status: DC | PRN

## 2018-10-17 MED FILL — hydrOXYzine HCL 50 MG TABS: 50 | 20 days supply | Qty: 60 | Fill #0

## 2018-10-17 NOTE — Patient Instructions (Signed)
Dolor del pie (Foot Pain) El dolor del pie puede tener muchas causas. Algunas causas frecuentes son las siguientes:  Una lesin.  Un esguince.  Artritis.  Ampollas.  Juanetes. INSTRUCCIONES PARA EL CUIDADO EN EL HOGAR Est atento a cualquier cambio en los sntomas. Tome estas medidas para aliviar las molestias:  Si se lo indican, aplique hielo sobre la zona afectada: ? Ponga el hielo en una bolsa plstica. ? Coloque una [employer] piel y la bolsa de hielo. ? Deje el hielo durante 15 a 20 minutos, 3 a 4veces por da, durante 2das.  Tome los medicamentos de venta libre y los recetados solamente como se lo haya indicado el mdico.  Use zapatos cmodos y anatmicos que tengan buen calce. No use zapatos con tacones altos.  No permanezca de pie ni camine durante largos perodos.  No levante mucho peso. Esto puede agregar presin en el pie.  Haga ejercicios de estiramiento para Engineer, materials del pie y la rigidez como se lo haya indicado el mdico.  Hgase masajes suaves en el pie.  Mantenga los pies, limpios y secos. SOLICITE ATENCIN MDICA SI:  El dolor no mejora despus de [address] de cuidados personales.  El dolor [place].  No puede apoyar el peso en el pie. SOLICITE ATENCIN MDICA DE INMEDIATO SI:  El pie se le adormece o tiene hormigueo.  El pie o los dedos de ese pie se le hinchan.  El pie o los dedos de ese pie se tornan de color blanco o [place].  Tiene el pie enrojecido y caliente al tacto. Esta informacin no tiene Theme park manager el consejo del mdico. Asegrese de hacerle al mdico cualquier pregunta que tenga. Document Released: 12/15/2015 Document Revised: 12/15/2015 Document Reviewed: 09/18/2014 Elsevier Interactive Patient Education  2019 [employer].  Dolor del pie (Foot Pain) El dolor del pie puede tener muchas causas. Algunas causas frecuentes son las siguientes:  Una lesin.  Un  esguince.  Artritis.  Ampollas.  Juanetes. INSTRUCCIONES PARA EL CUIDADO EN EL HOGAR Est atento a cualquier cambio en los sntomas. Tome estas medidas para aliviar las molestias:  Si se lo indican, aplique hielo sobre la zona afectada: ? Ponga el hielo en una bolsa plstica. ? Coloque una [employer] piel y la bolsa de hielo. ? Deje el hielo durante 15 a 20 minutos, 3 a 4veces por da, durante 2das.  Tome los medicamentos de venta libre y los recetados solamente como se lo haya indicado el mdico.  Use zapatos cmodos y anatmicos que tengan buen calce. No use zapatos con tacones altos.  No permanezca de pie ni camine durante largos perodos.  No levante mucho peso. Esto puede agregar presin en el pie.  Haga ejercicios de estiramiento para Engineer, materials del pie y la rigidez como se lo haya indicado el mdico.  Hgase masajes suaves en el pie.  Mantenga los pies, limpios y secos. SOLICITE ATENCIN MDICA SI:  El dolor no mejora despus de [address] de cuidados personales.  El dolor [place].  No puede apoyar el peso en el pie. SOLICITE ATENCIN MDICA DE INMEDIATO SI:  El pie se le adormece o tiene hormigueo.  El pie o los dedos de ese pie se le hinchan.  El pie o los dedos de ese pie se tornan de color blanco o [place].  Tiene el pie enrojecido y caliente al tacto. Esta informacin no tiene Theme park manager el consejo del mdico. Asegrese de hacerle al mdico cualquier  pregunta que tenga. Document Released: 12/15/2015 Document Revised: 12/15/2015 Document Reviewed: 09/18/2014 Elsevier Interactive Patient Education  2019 [employer].

## 2018-10-17 NOTE — Progress Notes (Signed)
Assessment & Plan:  [name] was seen today for follow-up.  Diagnoses and all orders for this visit:  Anxiety -     hydrOXYzine (ATARAX/VISTARIL) 50 MG tablet; Take 1 tablet (50 mg total) by mouth 3 (three) times daily as needed for anxiety.  Bilateral foot pain -     Ambulatory referral to Podiatry -     DG Foot Complete Right; Future -     DG Foot Complete Left; Future  Chronic migraine without aura without status migrainosus, not intractable -     butalbital-acetaminophen-caffeine (FIORICET, ESGIC) 50-325-40 MG tablet; Take 1-2 tablets by mouth every 6 (six) hours as needed for headache.    Patient has been counseled on age-appropriate routine health concerns for screening and prevention. These are reviewed and up-to-date. Referrals have been placed accordingly. Immunizations are up-to-date or declined.    Subjective:   Chief Complaint  Patient presents with  . Follow-up    Pt. is here to follow-up on anxiety and migraines. Pt. stated it has been fine.    HPI Amber Travis 38 y.o. female presents to office today for follow up of anxiety. VRI was used to communicate directly with patient for the entire encounter including providing detailed patient instructions. She has complaints of bilateral foot pain and numbness today.   Anxiety  Chronic and well controlled with hydralazine 50 mg TID prn.   Foot Problem When she removes her socks and shoes she endorses pain in the bilateral feet that radiates up to her upper legs. The pain is only present with weight bearing and with no shoes or socks on.  She describes the pain as a burning sensation throughout the feet. She takes tylenol and uses rubbing alcohol on her feet for her pain which provides some relief. The pain is located on the plantar side of both feet. She denies any discoloration, cyanosis injury or trauma to her feet.    Migraines Chronic and well controlled with prn use of Fioricet. She denies any visual  disturbances, nausea or vomiting.    Review of Systems  Constitutional: Negative for fever, malaise/fatigue and weight loss.  HENT: Negative.  Negative for nosebleeds.   Eyes: Negative.  Negative for blurred vision, double vision and photophobia.  Respiratory: Negative.  Negative for cough and shortness of breath.   Cardiovascular: Negative.  Negative for chest pain, palpitations and leg swelling.  Gastrointestinal: Negative.  Negative for heartburn, nausea and vomiting.  Musculoskeletal: Negative.  Negative for myalgias.  Neurological: Positive for tingling, sensory change and headaches. Negative for dizziness, focal weakness and seizures.  Psychiatric/Behavioral: Negative for suicidal ideas. The patient is nervous/anxious.     Past Medical History:  Diagnosis Date  . Carpal tunnel syndrome, right 06/2018  . Depression     Past Surgical History:  Procedure Laterality Date  . CARPAL TUNNEL RELEASE Right 06/28/2018   Procedure: RIGHT CARPAL TUNNEL RELEASE;  Surgeon: Tarry Kos, MD;  Location: [facility];  Service: Orthopedics;  Laterality: Right;  . LAPAROSCOPIC APPENDECTOMY N/A 05/10/2016   Procedure: APPENDECTOMY LAPAROSCOPIC;  Surgeon: Jimmye Norman, MD;  Location: [facility];  Service: General;  Laterality: N/A;    Family History  Problem Relation Age of Onset  . Diabetes Mother     Social History Reviewed with no changes to be made today.   Outpatient Medications Prior to Visit  Medication Sig Dispense Refill  . cetirizine (ZYRTEC) 10 MG tablet Take 1 tablet (10 mg total) by mouth daily.  30 tablet 11  . fluticasone (FLONASE) 50 MCG/ACT nasal spray Place 2 sprays into both nostrils daily. 16 g 6  . hydrOXYzine (ATARAX/VISTARIL) 50 MG tablet TAKE 1 TABLET (50 MG TOTAL) BY MOUTH 3 (THREE) TIMES DAILY AS NEEDED FOR ANXIETY. 60 tablet 1  . Multiple Vitamin (MULTIVITAMIN) tablet Take 1 tablet by mouth daily.     No facility-administered medications prior to visit.       No Known Allergies     Objective:    BP 116/81 (BP Location: Left Arm, Patient Position: Sitting, Cuff Size: Normal)   Pulse 81   Temp 98.7 F (37.1 C) (Oral)   Ht 4\' 11"  (1.499 m)   Wt 180 lb 3.2 oz (81.7 kg)   SpO2 96%   BMI 36.40 kg/m  Wt Readings from Last 3 Encounters:  10/17/18 180 lb 3.2 oz (81.7 kg)  10/17/18 162 lb (73.5 kg)  09/28/18 162 lb (73.5 kg)    Physical Exam Vitals signs and nursing note reviewed.  Constitutional:      Appearance: She is well-developed.  HENT:     Head: Normocephalic and atraumatic.  Neck:     Musculoskeletal: Normal range of motion.  Cardiovascular:     Rate and Rhythm: Normal rate and regular rhythm.     Heart sounds: Normal heart sounds. No murmur. No friction rub. No gallop.   Pulmonary:     Effort: Pulmonary effort is normal. No tachypnea or respiratory distress.     Breath sounds: Normal breath sounds. No decreased breath sounds, wheezing, rhonchi or rales.  Chest:     Chest wall: No tenderness.  Abdominal:     General: Bowel sounds are normal.     Palpations: Abdomen is soft.  Musculoskeletal: Normal range of motion.     Right foot: Normal range of motion.  Feet:     Right foot:     Protective Sensation: 10 sites tested. 10 sites sensed.     Skin integrity: Skin integrity normal.     Left foot:     Protective Sensation: 10 sites tested. 10 sites sensed.     Skin integrity: Skin integrity normal.  Skin:    General: Skin is warm and dry.  Neurological:     Mental Status: She is alert and oriented to person, place, and time.     Cranial Nerves: Cranial nerves are intact.     Sensory: Sensation is intact.     Motor: Motor function is intact.     Coordination: Coordination is intact. Coordination normal.     Gait: Gait is intact.  Psychiatric:        Behavior: Behavior normal. Behavior is cooperative.        Thought Content: Thought content normal.        Judgment: Judgment normal.          Patient has been  counseled extensively about nutrition and exercise as well as the importance of adherence with medications and regular follow-up. The patient was given clear instructions to go to ER or return to medical center if symptoms don't improve, worsen or new problems develop. The patient verbalized understanding.   Follow-up: Return in about 2 months (around 12/16/2018) for fasting labs/foot pain/anxiety.   Claiborne RiggZelda W Almadelia Looman, FNP-BC [facility] [place], [place] [phone]   10/17/2018, 6:21 PM

## 2018-10-17 NOTE — Progress Notes (Signed)
   Post-Op Visit Note   Patient: Amber Travis           Date of Birth: [birth year]           MRN: [id number] Visit Date: 10/17/2018 PCP: Claiborne Rigg, NP   Assessment & Plan:  Plan: Josselyn is s/p right carpal tunnel release on 06/28/2018. She follows up today for persistent numbness in median nerve distribution and pain around the incision. Incision is well healed. At last visit she received a steroid injection, this helped relieve her radiating pain and improve the numbness. She also feels some weakness in her hand. Overall she reports good improvement since the surgery. 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 She continues to wear her wrist brace at night. On exam her incision is well healed. She reports some decreased sensation to light touch in median nerve distribution. She has full ROM of the writs and fingers. She has 5/5 strength in the hand and is symmetric bilaterally. At this time, expect her to have gradual improvement in the numbness symptoms. She does report having scheduled OT in March to help her strength. Recommend wearing brace during work as needed. Follow up as needed.  Chief Complaint:  Chief Complaint  Patient presents with  . Right Wrist - Follow-up   Visit Diagnoses:  1. S/P carpal tunnel release   2. Right carpal tunnel syndrome      Follow-Up Instructions: Return if symptoms worsen or fail to improve.   Orders:  No orders of the defined types were placed in this encounter.  No orders of the defined types were placed in this encounter.   Imaging: No results found.  PMFS History: Patient Active Problem List   Diagnosis Date Noted  . S/P carpal tunnel release 08/10/2018  . Right carpal tunnel syndrome 06/28/2018  . Pelvic pain 06/13/2017  . 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 Breast lump in female 02/04/2015  . DENTAL CARIES 10/07/2010  . HYPERBILIRUBINEMIA 07/09/2010  . SKIN RASH 05/14/2010  . ANXIETY  DEPRESSION 05/04/2010  . FIBROCYSTIC BREAST DISEASE 05/04/2010  . UNSPECIFIED ADJUSTMENT REACTION 04/17/2009  . MIGRAINE HEADACHE 01/27/2009  . Depression 06/11/2008   Past Medical History:  Diagnosis Date  . Carpal tunnel syndrome, right 06/2018  . Depression     Family History  Problem Relation Age of Onset  . Diabetes Mother     Past Surgical History:  Procedure Laterality Date  . CARPAL TUNNEL RELEASE Right 06/28/2018   Procedure: RIGHT CARPAL TUNNEL RELEASE;  Surgeon: Tarry Kos, MD;  Location: [facility];  Service: Orthopedics;  Laterality: Right;  . LAPAROSCOPIC APPENDECTOMY N/A 05/10/2016   Procedure: APPENDECTOMY LAPAROSCOPIC;  Surgeon: Jimmye Norman, MD;  Location: [facility] OR;  Service: General;  Laterality: N/A;   Social History   Occupational History  . Not on file  Tobacco Use  . Smoking status: Never Smoker  . Smokeless tobacco: Never Used  Substance and Sexual Activity  . Alcohol use: No  . Drug use: No  . Sexual activity: Yes    Birth control/protection: Pill

## 2018-10-18 MED FILL — BUTALB-ACETAMIN-CAFF 50-325: 50-325-40 | 2 days supply | Qty: 20 | Fill #0

## 2018-11-06 ENCOUNTER — Ambulatory Visit (INDEPENDENT_AMBULATORY_CARE_PROVIDER_SITE_OTHER): Payer: No Typology Code available for payment source

## 2018-11-06 ENCOUNTER — Other Ambulatory Visit: Payer: Self-pay | Admitting: Podiatrist

## 2018-11-06 ENCOUNTER — Ambulatory Visit (INDEPENDENT_AMBULATORY_CARE_PROVIDER_SITE_OTHER): Payer: Self-pay | Admitting: Podiatrist

## 2018-11-06 ENCOUNTER — Encounter: Payer: Self-pay | Admitting: Podiatrist

## 2018-11-06 VITALS — BP 148/95 | HR 71

## 2018-11-06 DIAGNOSIS — M79671 Pain in right foot: Secondary | ICD-10-CM

## 2018-11-06 DIAGNOSIS — M7751 Other enthesopathy of right foot: Secondary | ICD-10-CM

## 2018-11-06 DIAGNOSIS — M7750 Other enthesopathy of unspecified foot: Secondary | ICD-10-CM

## 2018-11-06 DIAGNOSIS — M79672 Pain in left foot: Secondary | ICD-10-CM

## 2018-11-06 DIAGNOSIS — Q667 Congenital pes cavus, unspecified foot: Secondary | ICD-10-CM

## 2018-11-06 DIAGNOSIS — M7752 Other enthesopathy of left foot: Secondary | ICD-10-CM

## 2018-11-06 NOTE — Patient Instructions (Signed)
[employer]  Or [employer] [facility]  Are good places to get shoes  You need an arch support to hold up your foot.

## 2018-11-06 NOTE — Progress Notes (Signed)
  Chief Complaint  Patient presents with  . Foot Pain    Bilateral dorsal foot pain 2-66yr duration, pain is sharp and radiates into legs. Pt states pain is worst when walking. Pt states history of arthritis in family.     HPI: Patient is 38 y.o. female who presents today for foot pain along the medial arch and bottom of both feet which radiates up to the lateral ankle and leg.  She states she has tried shoes for flat feet and her pain continues.  She is seen today with the help of an interpreter.    Review of Systems  DATA OBTAINED: from patient  GENERAL: Feels well no fevers, no fatigue, no changes in appetite SKIN: No itching, no rashes, no open wounds EYES: No eye pain,no redness, no discharge EARS: No earache,no ringing of ears, NOSE: No congestion, no drainage, no bleeding  MOUTH/THROAT: No mouth pain, No sore throat, No difficulty chewing or swallowing  RESPIRATORY: No cough, no wheezing, no SOB CARDIAC: No chest pain,no heart palpitations, GI: No abdominal pain, No Nausea, no vomiting, no diarrhea, no heartburn or no reflux  GU: No dysuria, no increased frequency or urgency MUSCULOSKELETAL: No unrelieved bone/joint pain,  NEUROLOGIC: Awake, alert, appropriate to situation, No change in mental status. PSYCHIATRIC: No overt anxiety or sadness.No behavior issue.      Physical Exam  GENERAL APPEARANCE: Alert, conversant. Appropriately groomed. No acute distress.  VASCULAR: Pedal pulses palpable DP and PT bilateral.  Capillary refill time is immediate to all digits,  Proximal to distal cooling it warm to warm.  Digital hair growth is present bilateral  NEUROLOGIC: sensation is intact epicritically and protectively to 5.07 monofilament at 5/5 sites bilateral.  Light touch is intact bilateral, vibratory sensation intact bilateral, achilles tendon reflex is intact bilateral.  MUSCULOSKELETAL: acceptable muscle strength, tone and stability bilateral.  Intrinsic muscluature intact  bilateral.  Range of motion at ankle and first MPJ is normal bilateral.  High arched foot type is noted- non rigid.  No areas of discrete pain on palpation.   DERMATOLOGIC: skin is warm, supple, and dry.  No open lesions noted.  No interdigital maceration noted bilateral.    Radiographic exam:  Normal osseous mineralization.  No fracture or dislocation or acute osseous abnormalities present.  Joint spaces are normal.   Assessment   High arched foot type with associated tendonitis bilateral  Plan  Gave shoe change recommendations as this is likely the cause for her pain of choosing shoes in appropriate for her foot type and activity.  She will try running shoes such as brooks, nb, asiics, and will go to [employer] or [employer] for help with shoe selection

## 2018-11-13 ENCOUNTER — Other Ambulatory Visit: Payer: Self-pay | Admitting: Nurse Practitioner

## 2018-11-13 ENCOUNTER — Telehealth: Payer: Self-pay | Admitting: Nurse Practitioner

## 2018-11-13 MED ORDER — TOPIRAMATE 25 MG PO TABS: 25.0000 mg | ORAL_TABLET | Freq: Every day | ORAL | 1 refills | Status: DC

## 2018-11-13 NOTE — Telephone Encounter (Signed)
Will route to PCP 

## 2018-11-13 NOTE — Telephone Encounter (Signed)
New medication sent to pharmacy

## 2018-11-13 NOTE — Telephone Encounter (Signed)
Patient called because she is still having migraines and does not feel like the medication given to her is helping and would like to be prescribed something else. Please follow up.

## 2018-11-16 NOTE — Telephone Encounter (Signed)
CMA attempt to reach patient to inform new medication was sent for her headache.   No answer and left a VM.

## 2018-11-20 ENCOUNTER — Ambulatory Visit (INDEPENDENT_AMBULATORY_CARE_PROVIDER_SITE_OTHER): Payer: Self-pay | Admitting: Otolaryngology

## 2018-11-24 MED FILL — TOPIRAMATE 25 MG TABS: 25 | 30 days supply | Qty: 30 | Fill #0

## 2018-12-03 ENCOUNTER — Other Ambulatory Visit: Payer: Self-pay

## 2018-12-03 ENCOUNTER — Emergency Department (HOSPITAL_COMMUNITY)
Admission: EM | Admit: 2018-12-03 | Discharge: 2018-12-03 | Disposition: A | Discharge status: Home / Self Care | Payer: No Typology Code available for payment source | Attending: Emergency Medicine | Admitting: Emergency Medicine

## 2018-12-03 DIAGNOSIS — R51 Headache: Secondary | ICD-10-CM | POA: Insufficient documentation

## 2018-12-03 DIAGNOSIS — R519 Headache, unspecified: Secondary | ICD-10-CM

## 2018-12-03 DIAGNOSIS — R300 Dysuria: Secondary | ICD-10-CM

## 2018-12-03 DIAGNOSIS — Z79899 Other long term (current) drug therapy: Secondary | ICD-10-CM | POA: Insufficient documentation

## 2018-12-03 LAB — URINALYSIS, ROUTINE W REFLEX MICROSCOPIC
Bacteria, UA: NONE SEEN
Bilirubin Urine: NEGATIVE
Glucose, UA: NEGATIVE mg/dL
Ketones, ur: NEGATIVE mg/dL
Leukocytes,Ua: NEGATIVE
Nitrite: NEGATIVE
Protein, ur: NEGATIVE mg/dL
Specific Gravity, Urine: 1.018 (ref 1.005–1.030)
pH: 6 (ref 5.0–8.0)

## 2018-12-03 LAB — WET PREP, GENITAL
Clue Cells Wet Prep HPF POC: NONE SEEN
Sperm: NONE SEEN
Trich, Wet Prep: NONE SEEN
Yeast Wet Prep HPF POC: NONE SEEN

## 2018-12-03 LAB — I-STAT BETA HCG BLOOD, ED (MC, WL, AP ONLY): I-stat hCG, quantitative: <5 m[IU]/mL (ref <5)

## 2018-12-03 MED ORDER — ONDANSETRON 4 MG PO TBDP: 4.0000 mg | ORAL_TABLET | Freq: Three times a day (TID) | ORAL | 0 refills | Status: AC | PRN

## 2018-12-03 MED ORDER — KETOROLAC TROMETHAMINE 15 MG/ML IJ SOLN
15.0000 mg | Freq: Once | INTRAMUSCULAR | Status: AC
  Administered 2018-12-03: 15 mg via INTRAVENOUS
  Filled 2018-12-03: qty 1

## 2018-12-03 MED ORDER — SODIUM CHLORIDE 0.9 % IV BOLUS
1000.0000 mL | Freq: Once | INTRAVENOUS | Status: AC
  Administered 2018-12-03: 1000 mL via INTRAVENOUS

## 2018-12-03 MED ORDER — PROCHLORPERAZINE EDISYLATE 10 MG/2ML IJ SOLN
10.0000 mg | Freq: Once | INTRAMUSCULAR | Status: AC
  Administered 2018-12-03: 10 mg via INTRAVENOUS
  Filled 2018-12-03: qty 2

## 2018-12-03 MED ORDER — DIPHENHYDRAMINE HCL 50 MG/ML IJ SOLN
25.0000 mg | Freq: Once | INTRAMUSCULAR | Status: AC
  Administered 2018-12-03: 25 mg via INTRAVENOUS
  Filled 2018-12-03: qty 1

## 2018-12-03 NOTE — ED Triage Notes (Signed)
Patient has a hx of migraines. She is currently on medication for migraines but says it is no longer working. Got a new prescription on Thursday but medicine is not working. Patient has a burning sensation in her vaginal area as well. She endorses nausea, vomiting and dizziness.

## 2018-12-03 NOTE — ED Provider Notes (Signed)
[facility] EMERGENCY DEPARTMENT Provider Note   CSN: [id number] Arrival date & time: 12/03/18  2039    History   Chief Complaint Chief Complaint  Patient presents with  . Migraine    HPI Amber Travis is a 38 y.o. female with history of migraines, depression, anxiety presents for evaluation of migraine headache since yesterday as well as acute onset vaginal burning for 3 days.  She reports longstanding history of migraines for years and recently her medications were changed.  States that the new medication has not been helpful for her migraines.  She developed a migraine yesterday evening which has been constant, alternating from the left and right hemispheres associated with nausea and one episode of nonbloody nonbilious emesis.  Also notes phonophobia and photophobia.  Denies numbness or tingling of her extremities.  Has been taking ibuprofen as well as her home medicine without relief.  Chart review shows that she was recently prescribed Fioricet by her PCP.  No fever or head injury.  Reports that headache is consistent with her usual migraine headaches.  She is also complaining of a burning sensation to the "private area "for 3 days.  Note some dysuria.  Denies vaginal bleeding or abnormal discharge.  She is currently sexually active with one partner but does not always use protection.  She would like to be tested for STDs.  Denies abdominal pain.  Has not tried anything for her symptoms.  Patient primarily Spanish-speaking, a translator was used throughout the encounter.     The history is provided by the patient. The history is limited by a language barrier. A language interpreter was used.    Past Medical History:  Diagnosis Date  . Carpal tunnel syndrome, right 06/2018  . Depression     Patient Active Problem List   Diagnosis Date Noted  . S/P carpal tunnel release 08/10/2018  . Right carpal tunnel syndrome 06/28/2018  . Pelvic pain  06/13/2017  . 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 Breast lump in female 02/04/2015  . DENTAL CARIES 10/07/2010  . HYPERBILIRUBINEMIA 07/09/2010  . SKIN RASH 05/14/2010  . ANXIETY DEPRESSION 05/04/2010  . FIBROCYSTIC BREAST DISEASE 05/04/2010  . UNSPECIFIED ADJUSTMENT REACTION 04/17/2009  . MIGRAINE HEADACHE 01/27/2009  . Depression 06/11/2008    Past Surgical History:  Procedure Laterality Date  . CARPAL TUNNEL RELEASE Right 06/28/2018   Procedure: RIGHT CARPAL TUNNEL RELEASE;  Surgeon: Tarry Kos, MD;  Location: [facility];  Service: Orthopedics;  Laterality: Right;  . LAPAROSCOPIC APPENDECTOMY N/A 05/10/2016   Procedure: APPENDECTOMY LAPAROSCOPIC;  Surgeon: Jimmye Norman, MD;  Location: [facility] OR;  Service: General;  Laterality: N/A;     OB History    Gravida  5   Para  4   Term  4   Preterm  0   AB  1   Living  4     SAB  1   TAB  0   Ectopic  0   Multiple  0   Live Births  4            Home Medications    Prior to Admission medications   Medication Sig Start Date End Date Taking? Authorizing Provider  cetirizine (ZYRTEC) 10 MG tablet Take 1 tablet (10 mg total) by mouth daily. 05/29/18   Claiborne Rigg, NP  fluticasone (FLONASE) 50 MCG/ACT nasal spray Place 2 sprays into both nostrils daily. 06/07/18   Anders Simmonds, PA-C  hydrOXYzine (ATARAX/VISTARIL) 25 MG tablet  07/13/18   [provider]  hydrOXYzine (ATARAX/VISTARIL) 50 MG tablet Take 1 tablet (50 mg total) by mouth 3 (three) times daily as needed for anxiety. 10/17/18   Claiborne Rigg, NP  Multiple Vitamin (MULTIVITAMIN) tablet Take 1 tablet by mouth daily.    [provider]  ondansetron (ZOFRAN ODT) 4 MG disintegrating tablet Take 1 tablet (4 mg total) by mouth every 8 (eight) hours as needed for up to 3 days for nausea or vomiting. 12/03/18 12/06/18  Michela Pitcher A, PA-C  topiramate (TOPAMAX) 25 MG tablet Take 1 tablet (25 mg total) by mouth daily  for 30 days. 11/13/18 12/13/18  Claiborne Rigg, NP    Family History Family History  Problem Relation Age of Onset  . Diabetes Mother     Social History Social History   Tobacco Use  . Smoking status: Never Smoker  . Smokeless tobacco: Never Used  Substance Use Topics  . Alcohol use: No  . Drug use: No     Allergies   Patient has no known allergies.   Review of Systems Review of Systems  Constitutional: Negative for chills and fever.  Eyes: Positive for photophobia. Negative for visual disturbance.  Respiratory: Negative for shortness of breath.   Cardiovascular: Negative for chest pain.  Gastrointestinal: Positive for nausea and vomiting. Negative for abdominal pain.  Genitourinary: Positive for dysuria. Negative for vaginal bleeding and vaginal discharge.  Neurological: Positive for headaches. Negative for weakness and numbness.  All other systems reviewed and are negative.    Physical Exam Updated Vital Signs BP (!) 130/57 (BP Location: Right Arm)   Pulse 72   Temp 98.8 F (37.1 C) (Oral)   Resp 16   Ht 4\' 10"  (1.473 m)   Wt 74.8 kg   SpO2 98%   BMI 34.49 kg/m   Physical Exam Vitals signs and nursing note reviewed.  Constitutional:      General: She is not in acute distress.    Appearance: She is well-developed.  HENT:     Head: Normocephalic and atraumatic.  Eyes:     General:        Right eye: No discharge.        Left eye: No discharge.     Conjunctiva/sclera: Conjunctivae normal.  Neck:     Vascular: No JVD.     Trachea: No tracheal deviation.  Cardiovascular:     Rate and Rhythm: Normal rate.  Pulmonary:     Effort: Pulmonary effort is normal.  Abdominal:     General: Abdomen is flat.     Palpations: Abdomen is soft.     Tenderness: There is no abdominal tenderness. There is no guarding or rebound.  Genitourinary:    Comments: Examination performed in the presence of a chaperone.  No masses or lesions to the external genitalia.Moderate  amount of brown-red discharge in the vaginal vault. No cervical motion tenderness or adnexal tenderness.  Skin:    General: Skin is warm and dry.     Findings: No erythema.  Neurological:     General: No focal deficit present.     Mental Status: She is alert and oriented to person, place, and time.     Comments: Mental Status:  Alert, thought content appropriate, able to give a coherent history. Speech fluent without evidence of aphasia. Able to follow 2 step commands without difficulty.  Cranial Nerves:  II:  Peripheral visual fields grossly normal, pupils equal, round,  reactive to light III,IV, VI: ptosis not present, extra-ocular motions intact bilaterally  V,VII: smile symmetric, facial light touch sensation equal VIII: hearing grossly normal to voice  X: uvula elevates symmetrically  XI: bilateral shoulder shrug symmetric and strong XII: midline tongue extension without fassiculations Motor:  Normal tone. 5/5 strength of BUE and BLE major muscle groups including strong and equal grip strength and dorsiflexion/plantar flexion, no pronator drift. Sensory: light touch normal in all extremities.    Psychiatric:        Behavior: Behavior normal.      ED Treatments / Results  Labs (all labs ordered are listed, but only abnormal results are displayed) Labs Reviewed  WET PREP, GENITAL - Abnormal; Notable for the following components:      Result Value   WBC, Wet Prep HPF POC MANY (*)    All other components within normal limits  URINALYSIS, ROUTINE W REFLEX MICROSCOPIC - Abnormal; Notable for the following components:   Hgb urine dipstick LARGE (*)    All other components within normal limits  URINE CULTURE  RPR  HIV ANTIBODY (ROUTINE TESTING W REFLEX)  I-STAT BETA HCG BLOOD, ED ([facility], [facility], [facility] ONLY)  GC/CHLAMYDIA PROBE AMP ([place]) NOT AT [facility]    EKG None  Radiology No results found.  Procedures Procedures (including critical care time)  Medications Ordered in ED  Medications  ketorolac (TORADOL) 15 MG/ML injection 15 mg (15 mg Intravenous Given 12/03/18 2233)  prochlorperazine (COMPAZINE) injection 10 mg (10 mg Intravenous Given 12/03/18 2227)  diphenhydrAMINE (BENADRYL) injection 25 mg (25 mg Intravenous Given 12/03/18 2223)  sodium chloride 0.9 % bolus 1,000 mL (1,000 mLs Intravenous New Bag/Given 12/03/18 2222)     Initial Impression / Assessment and Plan / ED Course  I have reviewed the triage vital signs and the nursing notes.  Pertinent labs & imaging results that were available during my care of the patient were reviewed by me and considered in my medical decision making (see chart for details).         Patient presenting for evaluation of migraine headache and some dysuria/genital itching.  She is afebrile, vital signs are stable.  She is nontoxic in appearance.  Normal neurologic examination, no focal neurologic deficits.  No red flag signs concerning for subarachnoid hemorrhage, intracranial hemorrhage, or CVA.  No fever or meningeal signs to suggest meningitis.  Reports that the headache is consistent with her usual migraine headaches.  She received migraine cocktail with significant improvement in her headaches.  On reevaluation she is resting comfortably reports she is feeling much better.  Pelvic examination does not suggest STD or PID.  Abdominal examination is benign.  UA does not suggest UTI or nephrolithiasis.  No genital lesions to suggest herpes outbreak.  Given she has minimal symptoms and UA is not concerning, we will culture.  She is pending HIV and syphilis testing.  She understands to inform all sexual partners if any of her tests come back positive.  Recommend follow-up with PCP or neurology for reevaluation of her headaches.  Discussed strict ED return precautions. Pt verbalized understanding of and agreement with plan and is safe for discharge home at this time.  Translator was used throughout the encounter.   Final Clinical  Impressions(s) / ED Diagnoses   Final diagnoses:  Bad headache  Dysuria    ED Discharge Orders         Ordered    ondansetron (ZOFRAN ODT) 4 MG disintegrating tablet  Every 8 hours  PRN     12/03/18 2350           Jeanie Sewer, PA-C 12/03/18 2356    Linwood Dibbles, MD 12/05/18 [phone]

## 2018-12-03 NOTE — Discharge Instructions (Addendum)
Continue taking your home medicines as prescribed.  Drink plenty water and get plenty of rest.  You can alternate 600 mg of ibuprofen and [phone] mg of Tylenol every 3-4 hours as needed for pain. Do not exceed 4000 mg of Tylenol daily.  Take ibuprofen with food to avoid upset stomach issues.  Take Zofran as needed for nausea.  Let this medicine dissolve under your tongue and wait around 10 to 15 minutes before having anything to eat or drink to give this medicine time to work.  Your urine test did not show any sign of infection.  Neither did your pelvic examination.  Make sure to keep the area clean and dry.  You may find it helpful to apply a topical antifungal such as Lotrimin cream to the area if burning  persists.  If you have any abnormal lab work pending, you will receive a phone call.  Return to the emergency department if any concerning signs or symptoms develop such as high fevers, persistent vomiting, or severe abdominal pains.  Contine tomando sus medicamentos caseros segn lo prescrito. Beba mucha agua y descanse lo suficiente. Puede alternar 600 mg de ibuprofeno y [phone] mg de Tylenol cada 3-4 horas segn sea necesario para el dolor. No exceda 4000 mg de Tylenol diariamente. Tome ibuprofeno con alimentos para Community education officer.  Tome Zofran segn sea necesario para las nuseas. Deje que este medicamento se disuelva debajo de la lengua y espere alrededor de 10 a 15 minutos antes de comer o beber algo para que este medicamento funcione.  Su anlisis de [place] no mostr ningn signo de infeccin. Tampoco tu examen plvico. Asegrese de mantener el rea limpia y seca. Puede ser til aplicar un antimictico tpico como la crema Lotrimin en el rea si la quema persiste. Si tiene algn trabajo de laboratorio anormal pendiente, recibir una llamada telefnica.  Regrese al departamento de emergencias si se presentan signos o sntomas relacionados, como fiebre alta, vmitos  persistentes o dolores abdominales severos.

## 2018-12-04 LAB — HIV ANTIBODY (ROUTINE TESTING W REFLEX): HIV Screen 4th Generation wRfx: NONREACTIVE

## 2018-12-04 LAB — RPR: RPR Ser Ql: NONREACTIVE

## 2018-12-05 LAB — URINE CULTURE: Culture: 50000 — AB

## 2018-12-05 LAB — GC/CHLAMYDIA PROBE AMP (~~LOC~~) NOT AT ARMC
Chlamydia: NEGATIVE
Neisseria Gonorrhea: NEGATIVE

## 2018-12-06 ENCOUNTER — Telehealth: Payer: Self-pay

## 2018-12-06 NOTE — Telephone Encounter (Signed)
No treatment for [facility] ED 12/03/2018 per [facility]

## 2018-12-13 ENCOUNTER — Other Ambulatory Visit: Payer: Self-pay

## 2018-12-13 ENCOUNTER — Ambulatory Visit: Payer: Self-pay | Attending: Family Medicine | Admitting: Family Medicine

## 2018-12-13 ENCOUNTER — Encounter: Payer: Self-pay | Admitting: Family Medicine

## 2018-12-13 DIAGNOSIS — N949 Unspecified condition associated with female genital organs and menstrual cycle: Secondary | ICD-10-CM

## 2018-12-13 DIAGNOSIS — N9489 Other specified conditions associated with female genital organs and menstrual cycle: Secondary | ICD-10-CM

## 2018-12-13 DIAGNOSIS — F419 Anxiety disorder, unspecified: Secondary | ICD-10-CM

## 2018-12-13 DIAGNOSIS — G44209 Tension-type headache, unspecified, not intractable: Secondary | ICD-10-CM

## 2018-12-13 MED ORDER — BUTALBITAL-APAP-CAFFEINE 50-325-40 MG PO TABS: 1.0000 | ORAL_TABLET | Freq: Four times a day (QID) | ORAL | 1 refills | Status: DC | PRN

## 2018-12-13 MED ORDER — FLUCONAZOLE 150 MG PO TABS: 150.0000 mg | ORAL_TABLET | Freq: Once | ORAL | 0 refills | Status: AC

## 2018-12-13 NOTE — Progress Notes (Signed)
Virtual Visit via Telephone Note  I connected with Amber Travis on 12/13/18 at  8:50 AM EDT by telephone and verified that I am speaking with the correct person using two identifiers.   I discussed the limitations, risks, security and privacy concerns of performing an evaluation and management service by telephone and the availability of in person appointments. I also discussed with the patient that there may be a patient responsible charge related to this service. The patient expressed understanding and agreed to proceed.   History of Present Illness: Amber Travis is a 38 year old female who is seen with the aid of a Spanish interpreter today. She was seen at the ED on 12/03/2018 for headaches and vaginal burning.  She was treated with antiemetics and Toradol.  UA was negative for UTI, urine culture was negative, GC chlamydia negative, RPR was negative  She informs me since her ED visit she has not had any additional headache episodes and attributes episodes of headaches to periods when she is stressed and occur in the frontal region with associated photophobia and episodes occur once per week.  Previously prescribed Topamax by her PCP which she states was not effective. She denies insomnia. She complains of anxiety which is uncontrolled.  On further questioning she has been on 25 mg of hydroxyzine however her PCP had increased hydroxyzine to 50 mg. She has no urinary symptoms at this time but complains of vaginal burning with no itching, no discharge, no vaginal sores.    Observations/Objective: Awake, alert, oriented x3 Not in acute distress  Assessment and Plan: 1. Vaginal burning Urine studies from ED by means of urine culture, urine ancillary tests were negative Consider Herpes testing if persistent. - fluconazole (DIFLUCAN) 150 MG tablet; Take 1 tablet (150 mg total) by mouth once for 1 dose.  Dispense: 1 tablet; Refill: 0  2. Anxiety Uncontrolled Prescribed 50 mg  of hydroxyzine however she has been taking 25 mg Advised to increase to 50 mg which was prescribed by PCP If symptoms persist he may benefit from addition of an SSRI  3. Tension-type headache, not intractable, unspecified chronicity pattern Seems to be stress-induced Placed on Fioricet as she states Topamax has been ineffective If frequency of headache increases and will consider increasing dose of Topamax for prophylaxis - butalbital-acetaminophen-caffeine (FIORICET, ESGIC) 50-325-40 MG tablet; Take 1 tablet by mouth every 6 (six) hours as needed for headache.  Dispense: 40 tablet; Refill: 1   Follow Up Instructions:    I discussed the assessment and treatment plan with the patient. The patient was provided an opportunity to ask questions and all were answered. The patient agreed with the plan and demonstrated an understanding of the instructions.   The patient was advised to call back or seek an in-person evaluation if the symptoms worsen or if the condition fails to improve as anticipated.  I provided 15 minutes of non-face-to-face time during this encounter.   Hoy Register, MD

## 2018-12-13 NOTE — Progress Notes (Signed)
Patient has been called and DOB has been verified. Patient has been screened and transferred to PCP to start phone visit.  C/C: HFU  Refills: hydroxyzine

## 2018-12-14 MED FILL — BUTALB-ACETAMIN-CAFF 50-325: 50-325-40 | 10 days supply | Qty: 40 | Fill #0

## 2018-12-14 MED FILL — FLUCONAZOLE 150 MG TABS: 150 | 1 days supply | Qty: 1 | Fill #0

## 2018-12-27 ENCOUNTER — Telehealth: Payer: Self-pay | Admitting: Nurse Practitioner

## 2018-12-27 NOTE — Telephone Encounter (Signed)
Patient called stating the vaginal medication is not working for her and would like to know what else she can do. Please follow up.

## 2018-12-27 NOTE — Telephone Encounter (Signed)
Advised patient and scheduled an appt.

## 2018-12-27 NOTE — Telephone Encounter (Signed)
Please have patient to come in for lab and drop off urine for cytology.

## 2018-12-28 ENCOUNTER — Other Ambulatory Visit: Payer: Self-pay

## 2018-12-28 ENCOUNTER — Ambulatory Visit: Payer: Self-pay | Attending: Nurse Practitioner

## 2019-01-03 ENCOUNTER — Ambulatory Visit: Payer: Self-pay | Admitting: Nurse Practitioner

## 2019-01-04 ENCOUNTER — Ambulatory Visit: Payer: Self-pay | Attending: Family Medicine | Admitting: Physician Assistant

## 2019-01-04 ENCOUNTER — Other Ambulatory Visit: Payer: Self-pay

## 2019-01-04 DIAGNOSIS — J069 Acute upper respiratory infection, unspecified: Secondary | ICD-10-CM

## 2019-01-04 DIAGNOSIS — G44209 Tension-type headache, unspecified, not intractable: Secondary | ICD-10-CM

## 2019-01-04 DIAGNOSIS — Z789 Other specified health status: Secondary | ICD-10-CM

## 2019-01-04 MED ORDER — BENZONATATE 100 MG PO CAPS: 200.0000 mg | ORAL_CAPSULE | Freq: Three times a day (TID) | ORAL | 0 refills | Status: DC | PRN

## 2019-01-04 MED ORDER — FLUTICASONE PROPIONATE 50 MCG/ACT NA SUSP: 2.0000 | Freq: Every day | NASAL | 6 refills | Status: DC

## 2019-01-04 MED ORDER — IBUPROFEN 600 MG PO TABS: 600.0000 mg | ORAL_TABLET | Freq: Three times a day (TID) | ORAL | 0 refills | Status: DC | PRN

## 2019-01-04 MED FILL — FLUTICASONE PROP 50 MCG SPR: 50 | 30 days supply | Qty: 16 | Fill #0

## 2019-01-04 MED FILL — ?IBUPROFEN 600 MG TABS: 600 | 15 days supply | Qty: 45 | Fill #0

## 2019-01-04 MED FILL — BENZONATATE 100 MG CAPS: 100 | 10 days supply | Qty: 60 | Fill #0

## 2019-01-04 NOTE — Progress Notes (Signed)
Virtual Visit via Telephone Note  I connected with Armida Sans on 01/04/19 at 10:50 AM EDT by telephone and verified that I am speaking with the correct person using two identifiers.   I discussed the limitations, risks, security and privacy concerns of performing an evaluation and management service by telephone and the availability of in person appointments. I also discussed with the patient that there may be a patient responsible charge related to this service. The patient expressed understanding and agreed to proceed.  Patient location:  home My Location:  [facility] office Persons on the call:  Myself, the patient, Juan(interpreter)  History of Present Illness: ST, sneezing, runny nose, chills then feels hot,  Symptoms for about 8 days.  No actual fever. Cough is non-productive.  Feels similar to allergy.  No travel.  No exposure to anyone with Covid-19. No difficulty breathing.  Continues to have HA.  No changes to HA.  fioricet not helping. No vision changes or red flags.  No N/V.      Observations/Objective:  A&Ox3.  tp linear.  Speech clear   Assessment and Plan: 1. Language barrier pacific interpreters used and additional time performing visit was required.   2. Tension-type headache, not intractable, unspecified chronicity pattern unchanged - Ambulatory referral to Neurology - ibuprofen (ADVIL) 600 MG tablet; Take 1 tablet (600 mg total) by mouth every 8 (eight) hours as needed. Prn body aches or HA  Dispense: 45 tablet; Refill: 0  3. Viral upper respiratory tract infection Continue Covid quarantine precautions. Fluids, rest, respiratory care.   - benzonatate (TESSALON) 100 MG capsule; Take 2 capsules (200 mg total) by mouth 3 (three) times daily as needed for cough.  Dispense: 60 capsule; Refill: 0 - fluticasone (FLONASE) 50 MCG/ACT nasal spray; Place 2 sprays into both nostrils daily.  Dispense: 16 g; Refill: 6    Follow Up Instructions: 1 month with PCP.   To ED if worsens   I discussed the assessment and treatment plan with the patient. The patient was provided an opportunity to ask questions and all were answered. The patient agreed with the plan and demonstrated an understanding of the instructions.   The patient was advised to call back or seek an in-person evaluation if the symptoms worsen or if the condition fails to improve as anticipated.  I provided 19 minutes of non-face-to-face time during this encounter.   Georgian Co, PA-C  Patient ID: Amber Travis, female   DOB: 09-Mar-1981, 38 y.o.   MRN: [id number]

## 2019-01-04 NOTE — Progress Notes (Signed)
Called patient to initiate their telephone visit with provider Georgian Co, PA. Used [employer](Juan ID # [id number]). Verified date of birth. Patient has c/o headache for the past 8 days. States the Fioricet isn't working to control the headaches. Patient also has c/o sore throat, loss of appetite, chills, dry cough x 8 days. Has taken Nyquil at night. No known sick contact. KWalker, CMA.

## 2019-01-08 ENCOUNTER — Telehealth: Payer: Self-pay | Admitting: Nurse Practitioner

## 2019-01-08 NOTE — Telephone Encounter (Signed)
Please ask patient to drop off urine today if can please.

## 2019-01-08 NOTE — Telephone Encounter (Signed)
Patient called because she would like to kow her urine results. Patient states she has not had her cycle and is worried. Please follow up.

## 2019-01-10 ENCOUNTER — Ambulatory Visit: Payer: No Typology Code available for payment source

## 2019-01-12 NOTE — Progress Notes (Deleted)
Virtual Visit via Video Note The purpose of this virtual visit is to provide medical care while limiting exposure to the novel coronavirus.    Consent was obtained for video visit:  Yes Answered questions that patient had about telehealth interaction:  Yes I discussed the limitations, risks, security and privacy concerns of performing an evaluation and management service by telemedicine. I also discussed with the patient that there may be a patient responsible charge related to this service. The patient expressed understanding and agreed to proceed.  Pt location: Home Physician Location: office Name of referring provider:  Anders SimmondsMcClung, Angela M, PA-C I connected with Amber Travis at patients initiation/request on 01/15/2019 at 10:30 AM EDT by video enabled telemedicine application and verified that I am speaking with the correct person using two identifiers. Pt MRN:  [id number] Pt DOB:  07-Dec-1980 Video Participants:  Amber Travis;  ***   History of Present Illness:  Amber Travis NovemberCarcia Travis is a 38 year old Spanish-speaking woman with anxiety who presents for headache.  History supplemented by ED and referring provider notes.  Onset:  ***.  She subsequently was seen in the ED on 12/03/18 for headache and received a headache cocktail of toradol 15mg , Compazine 10mg , and Benadryl 25mg . *** Location:  *** Quality:  *** Intensity:  ***.  *** denies new headache, thunderclap headache or severe headache that wakes *** from sleep. Aura:  *** Associated symptoms:  Nausea, vomiting, photophobia, phonophobia.  She denies associated unilateral numbness or weakness. Duration:  *** Frequency:  *** Frequency of abortive medication: *** Triggers:  *** Relieving factors:  *** Activity:  Aggravates.  Current NSAIDS:  ibuprofen Current analgesics:  Fioricet Current triptans:  *** Current ergotamine:  *** Current anti-emetic:  *** Current muscle relaxants:  *** Current  anti-anxiolytic:  *** Current sleep aide:  *** Current Antihypertensive medications:  *** Current Antidepressant medications:  *** Current Anticonvulsant medications:  *** Current anti-CGRP:  *** Current Vitamins/Herbal/Supplements:  *** Current Antihistamines/Decongestants:  Flonase Other therapy:  *** Hormone/birth control:  *** Other medications:  ***  Past NSAIDS:  naproxen Past analgesics:  tramadol Past abortive triptans:  *** Past abortive ergotamine:  *** Past muscle relaxants:  Robaxin Past anti-emetic:  Zofran 8mg  Past antihypertensive medications:  *** Past antidepressant medications:  Sertraline 200mg  Past anticonvulsant medications:  topiramate 25mg  Past anti-CGRP:  *** Past vitamins/Herbal/Supplements:  *** Past antihistamines/decongestants:  *** Other past therapies:  ***  Caffeine:  *** Alcohol:  *** Smoker:  *** Diet:  *** Exercise:  *** Depression:  ***; Anxiety:  *** Other pain:  *** Sleep hygiene:  *** Family history of headache:  ***  CMP from 04/03/18 was unremarkable except for elevated ALT of 56.  Past Medical History: Past Medical History:  Diagnosis Date  . Carpal tunnel syndrome, right 06/2018  . Depression     Medications: Outpatient Encounter Medications as of 01/15/2019  Medication Sig  . benzonatate (TESSALON) 100 MG capsule Take 2 capsules (200 mg total) by mouth 3 (three) times daily as needed for cough.  . butalbital-acetaminophen-caffeine (FIORICET, ESGIC) 50-325-40 MG tablet Take 1 tablet by mouth every 6 (six) hours as needed for headache.  . fluticasone (FLONASE) 50 MCG/ACT nasal spray Place 2 sprays into both nostrils daily.  Marland Kitchen. ibuprofen (ADVIL) 600 MG tablet Take 1 tablet (600 mg total) by mouth every 8 (eight) hours as needed. Prn body aches or HA   No facility-administered encounter medications on file as of 01/15/2019.  Allergies: No Known Allergies  Family History: Family History  Problem Relation Age of Onset   . Diabetes Mother     Social History: Social History   Socioeconomic History  . Marital status: Single    Spouse name: Not on file  . Number of children: Not on file  . Years of education: Not on file  . Highest education level: 9th grade  Occupational History  . Not on file  Social Needs  . Financial resource strain: Not on file  . Food insecurity:    Worry: Not on file    Inability: Not on file  . Transportation needs:    Medical: No    Non-medical: No  Tobacco Use  . Smoking status: Never Smoker  . Smokeless tobacco: Never Used  Substance and Sexual Activity  . Alcohol use: No  . Drug use: No  . Sexual activity: Yes    Birth control/protection: Pill  Lifestyle  . Physical activity:    Days per week: Not on file    Minutes per session: Not on file  . Stress: Not on file  Relationships  . Social connections:    Talks on phone: Not on file    Gets together: Not on file    Attends religious service: Not on file    Active member of club or organization: Not on file    Attends meetings of clubs or organizations: Not on file    Relationship status: Not on file  . Intimate partner violence:    Fear of current or ex partner: Not on file    Emotionally abused: Not on file    Physically abused: Not on file    Forced sexual activity: Not on file  Other Topics Concern  . Not on file  Social History Narrative  . Not on file    Review Of Systems: ***    Observations/Objective:   There were no vitals filed for this visit.    Assessment and Plan:     Follow Up Instructions:    -I discussed the assessment and treatment plan with the patient. The patient was provided an opportunity to ask questions and all were answered. The patient agreed with the plan and demonstrated an understanding of the instructions.   The patient was advised to call back or seek an in-person evaluation if the symptoms worsen or if the condition fails to improve as anticipated.     Total Time spent in visit with the patient was:  ***, of which more than 50% of the time was spent in counseling and/or coordinating care on ***.   Pt understands and agrees with the plan of care outlined.     Cira Servant, DO

## 2019-01-15 ENCOUNTER — Telehealth: Payer: Self-pay | Admitting: Neurology

## 2019-01-15 ENCOUNTER — Other Ambulatory Visit: Payer: Self-pay

## 2019-01-15 ENCOUNTER — Ambulatory Visit: Payer: Self-pay | Admitting: Nurse Practitioner

## 2019-01-15 ENCOUNTER — Telehealth: Payer: Self-pay

## 2019-01-15 NOTE — Telephone Encounter (Signed)
Called the interpreter line and spoke with [facility] ID# [phone]. She was unable to get the Pt on the phone. She left a message for the Pt to call back.

## 2019-01-17 NOTE — Progress Notes (Addendum)
Virtual Visit via Video Note The purpose of this virtual visit is to provide medical care while limiting exposure to the novel coronavirus.    Consent was obtained for video visit:  Yes Answered questions that patient had about telehealth interaction:  Yes I discussed the limitations, risks, security and privacy concerns of performing an evaluation and management service by telemedicine. I also discussed with the patient that there may be a patient responsible charge related to this service. The patient expressed understanding and agreed to proceed. Interpreter Angie used via [email or web address]  Pt location: Home Physician Location: Home Name of referring provider:  Claiborne Rigg, NP I connected with Armida Sans at patients initiation/request on 01/18/2019 at 10:00 AM EDT by video enabled telemedicine application and verified that I am speaking with the correct person using two identifiers. Pt MRN:  [id number] Pt DOB:  [birth year] Video Participants:  Armida Sans   History of Present Illness:  Amber Travis is a 38 year old Spanish-speaking woman with anxiety who presents for headache.  History supplemented by ED and referring provider notes.  Onset:  In her 75s.  They have been frequent since December 2019.  She reports increased worrying that probably has increased her headaches.  She subsequently was seen in the ED on 12/03/18 for headache and received a headache cocktail of toradol 15mg , Compazine 10mg , and Benadryl 25mg . It was ineffective Location:  Bilateral temples and across forehead Quality:  throbbing Intensity:  severe.  She denies new headache, thunderclap headache Aura:  no Associated symptoms:  Photophobia, phonophobia, nausea, vomiting. Blurred vision.  She denies associated unilateral numbness or weakness. Duration: 3 days Frequency:  6 days a week Triggers:  Emotional stress Relieving factors:  Rubbing alcohol and Vick's vapor rub on  head and tie scarf around her head Activity:  aggravates  Current NSAIDS:  ibuprofen Current analgesics:  none Current triptans:  none Current ergotamine:  none Current anti-emetic:  none Current muscle relaxants:  none Current anti-anxiolytic:  none Current sleep aide:  none Current Antihypertensive medications:  none Current Antidepressant medications:  none Current Anticonvulsant medications:  none Current anti-CGRP:  none Current Vitamins/Herbal/Supplements:  none Current Antihistamines/Decongestants:  Flonase Other therapy:  none Hormone/birth control:  Desogestrel/ethinyl estradiol, on it since around January (after headaches increased)  Past NSAIDS:  naproxen Past analgesics:  tramadol, Fioricet Past abortive triptans:  none Past abortive ergotamine:  none Past muscle relaxants:  Robaxin Past anti-emetic:  Zofran 8mg  Past antihypertensive medications:  none Past antidepressant medications:  Sertraline 200mg  Past anticonvulsant medications:  topiramate 50mg  Past anti-CGRP:  none Other past therapies:  none  Caffeine:  1 cup of coffee daily Depression/Anxiety:  Anxiety Sleep hygiene:  Poor due to worrying  Family history of headache:  Grandmother (migraines)  CMP from 04/03/18 was unremarkable except for elevated ALT of 56.  Past Medical History: Past Medical History:  Diagnosis Date  . Carpal tunnel syndrome, right 06/2018  . Depression     Medications: Outpatient Encounter Medications as of 01/18/2019  Medication Sig  . benzonatate (TESSALON) 100 MG capsule Take 2 capsules (200 mg total) by mouth 3 (three) times daily as needed for cough.  . butalbital-acetaminophen-caffeine (FIORICET, ESGIC) 50-325-40 MG tablet Take 1 tablet by mouth every 6 (six) hours as needed for headache.  . fluticasone (FLONASE) 50 MCG/ACT nasal spray Place 2 sprays into both nostrils daily.  Marland Kitchen ibuprofen (ADVIL) 600 MG tablet Take 1 tablet (600 mg total) by mouth every  8 (eight) hours  as needed. Prn body aches or HA   No facility-administered encounter medications on file as of 01/18/2019.     Allergies: No Known Allergies  Family History: Family History  Problem Relation Age of Onset  . Diabetes Mother     Social History: Social History   Socioeconomic History  . Marital status: Single    Spouse name: Not on file  . Number of children: Not on file  . Years of education: Not on file  . Highest education level: 9th grade  Occupational History  . Not on file  Social Needs  . Financial resource strain: Not on file  . Food insecurity:    Worry: Not on file    Inability: Not on file  . Transportation needs:    Medical: No    Non-medical: No  Tobacco Use  . Smoking status: Never Smoker  . Smokeless tobacco: Never Used  Substance and Sexual Activity  . Alcohol use: No  . Drug use: No  . Sexual activity: Yes    Birth control/protection: Pill  Lifestyle  . Physical activity:    Days per week: Not on file    Minutes per session: Not on file  . Stress: Not on file  Relationships  . Social connections:    Talks on phone: Not on file    Gets together: Not on file    Attends religious service: Not on file    Active member of club or organization: Not on file    Attends meetings of clubs or organizations: Not on file    Relationship status: Not on file  . Intimate partner violence:    Fear of current or ex partner: Not on file    Emotionally abused: Not on file    Physically abused: Not on file    Forced sexual activity: Not on file  Other Topics Concern  . Not on file  Social History Narrative  . Not on file    Observations/Objective:   Height 4\' 10"  (1.473 m), weight 170 lb (77.1 kg). No acute distress.  Alert and oriented.  Speech fluent and not dysarthric.  Language intact.  Eyes orthophoric on primary gaze.  Face symmetric.  Assessment and Plan:   1.  Chronic migraine without aura, without status migrainosus, intractable. 2.  Anxiety   1.  For preventative management, start nortriptyline 25mg  at bedtime.  We can increase dose to 50mg  at bedtime in 4 weeks if needed. 2.  For abortive therapy, sumatriptan 100mg  3.  Limit use of pain relievers to no more than 2 days out of week to prevent risk of rebound or medication-overuse headache. 4.  Keep headache diary 5.  Exercise, hydration, caffeine cessation, sleep hygiene, monitor for and avoid triggers 6.  Consider:  magnesium citrate 400mg  daily, riboflavin 400mg  daily, and coenzyme Q10 100mg  three times daily 7. Always keep in mind that currently taking a hormone or birth control may be a possible trigger or aggravating factor for migraine. 8. Follow up in 4 months   Follow Up Instructions:    -I discussed the assessment and treatment plan with the patient. The patient was provided an opportunity to ask questions and all were answered. The patient agreed with the plan and demonstrated an understanding of the instructions.   The patient was advised to call back or seek an in-person evaluation if the symptoms worsen or if the condition fails to improve as anticipated.   Cira ServantAdam Robert Vasiliki Smaldone, DO

## 2019-01-18 ENCOUNTER — Other Ambulatory Visit: Payer: Self-pay

## 2019-01-18 ENCOUNTER — Encounter: Payer: Self-pay | Admitting: Neurology

## 2019-01-18 ENCOUNTER — Telehealth (INDEPENDENT_AMBULATORY_CARE_PROVIDER_SITE_OTHER): Payer: Self-pay | Admitting: Neurology

## 2019-01-18 VITALS — Ht <= 58 in | Wt 170.0 lb

## 2019-01-18 DIAGNOSIS — F419 Anxiety disorder, unspecified: Secondary | ICD-10-CM

## 2019-01-18 DIAGNOSIS — G43719 Chronic migraine without aura, intractable, without status migrainosus: Secondary | ICD-10-CM

## 2019-01-18 MED ORDER — SUMATRIPTAN SUCCINATE 100 MG PO TABS: ORAL_TABLET | ORAL | 3 refills | Status: DC

## 2019-01-18 MED ORDER — NORTRIPTYLINE HCL 25 MG PO CAPS: 25.0000 mg | ORAL_CAPSULE | Freq: Every day | ORAL | 3 refills | Status: DC

## 2019-01-18 MED FILL — NORTRIPTYLINE HCL 25 MG CAP: 25 | 30 days supply | Qty: 30 | Fill #0

## 2019-01-18 MED FILL — ?SUMATRIPTAN SUCC 50MG TAB: 50 | 30 days supply | Qty: 18 | Fill #0

## 2019-01-31 ENCOUNTER — Other Ambulatory Visit: Payer: Self-pay

## 2019-01-31 ENCOUNTER — Encounter: Payer: Self-pay | Admitting: Nurse Practitioner

## 2019-01-31 ENCOUNTER — Telehealth: Payer: Self-pay | Admitting: Neurology

## 2019-01-31 ENCOUNTER — Ambulatory Visit: Payer: Self-pay | Attending: Nurse Practitioner | Admitting: Nurse Practitioner

## 2019-01-31 DIAGNOSIS — F32A Depression, unspecified: Secondary | ICD-10-CM

## 2019-01-31 DIAGNOSIS — F329 Major depressive disorder, single episode, unspecified: Secondary | ICD-10-CM

## 2019-01-31 DIAGNOSIS — N939 Abnormal uterine and vaginal bleeding, unspecified: Secondary | ICD-10-CM

## 2019-01-31 DIAGNOSIS — F419 Anxiety disorder, unspecified: Secondary | ICD-10-CM

## 2019-01-31 MED ORDER — ESCITALOPRAM OXALATE 10 MG PO TABS: 10.0000 mg | ORAL_TABLET | Freq: Every day | ORAL | 1 refills | Status: DC

## 2019-01-31 MED ORDER — HYDROXYZINE HCL 50 MG PO TABS: 50.0000 mg | ORAL_TABLET | Freq: Three times a day (TID) | ORAL | 1 refills | Status: DC | PRN

## 2019-01-31 MED FILL — hydrOXYzine HCL 50 MG TABS: 50 | 30 days supply | Qty: 90 | Fill #0

## 2019-01-31 MED FILL — ESCITALOPRAM 10 MG TABLET: 10 | 30 days supply | Qty: 30 | Fill #0

## 2019-01-31 NOTE — Telephone Encounter (Signed)
She has failed antidepressants and antiepileptic medications.  I don't want to start a beta blocker due to depression.  I would therefore like to start an anti-CGRP (whatever is covered).  For abortive therapy, try rizatriptan 10mg .

## 2019-01-31 NOTE — Telephone Encounter (Signed)
Called the interpreter line, spoke with Viviann Spare ID# [phone].  He contacted the Pt. I confirmed it was nortriptyline she was taking QHS, not sumatriptan. The nortriptyline is causing her not to sleep and she feels more depressed, though she did just receive news her grandmother passed away in her country. The sumatriptan causes her neck to feel like it is pulled to the side and stuck in one place.

## 2019-01-31 NOTE — Telephone Encounter (Signed)
Left message with after hour service on 01-30-19@4 :50  Caller states that she is having numbness of head and neck and nose itches after takin medication which she is taking for migraine. She is taking nortiplyline 25 mg and sumatritan 50 mg was taking both twice a day but stopped the nortiplyine taking the sumatritan at bed time  Unable to sleep   Was told to follow up with PCP in three days and to call back if constant weakness or numbness in arm and legs * you become worse  Care given per neurologic deficit adult guidelines

## 2019-01-31 NOTE — Progress Notes (Signed)
Virtual Visit via Telephone Note Due to national recommendations of social distancing due to COVID 19, telehealth visit is felt to be most appropriate for this patient at this time.  I discussed the limitations, risks, security and privacy concerns of performing an evaluation and management service by telephone and the availability of in person appointments. I also discussed with the patient that there may be a patient responsible charge related to this service. The patient expressed understanding and agreed to proceed.    I connected with Amber Travis on 01/31/19  at  10:47 AM EDT  EDT by telephone and verified that I am speaking with the correct person using two identifiers.   Consent I discussed the limitations, risks, security and privacy concerns of performing an evaluation and management service by telephone and the availability of in person appointments. I also discussed with the patient that there may be a patient responsible charge related to this service. The patient expressed understanding and agreed to proceed.   Location of Patient: Private Residence   Location of Provider: [facility]    Persons participating in Telemedicine visit: Bertram Denver FNP-BC [place] CMA Oreta E Junius Finner  Spanish Interpreter [id number]   History of Present Illness: Telemedicine visit for:  Follow up to anxiety and depression. She also has complaints of increased stress and breakthrough bleeding this month despite taking OCPs.   Anxiety and Depression Anxiety well controlled with hydroxyzine  TID however depression screening remains positive. She is agreeable to SSRI. Denies any thoughts of self harm.  Depression screen [facility] 2/9 01/31/2019 10/17/2018 08/18/2018 06/07/2018 05/29/2018  Decreased Interest Down, Depressed, Hopeless 3 0 PHQ - 2 Score Altered sleeping Tired, decreased energy Change  in appetite Feeling bad or failure about yourself  0 Trouble concentrating 0 Moving slowly or fidgety/restless 0 Suicidal thoughts 0 0 0 0 0  PHQ-9 Score Some recent data might be hidden   GAD 7 : Generalized Anxiety Score 01/31/2019 08/18/2018 06/07/2018 05/29/2018  Nervous, Anxious, on Edge Control/stop worrying Worry too much - different things Trouble relaxing Restless Easily annoyed or irritable 0 Afraid - awful might happen 1 - 1 -  Total GAD 7 Score 16 - 15 -    Abnormal Uterine Bleeding  Patient complains of breakthrough bleeding despite compliance with her OCPs. She had a 3 day cycle and then started spotting a few days after the first cycle stopped. She does endorse increased stress worrying about her aunt who has health issues.   Patient denies and GU symptoms. Evaluation to date includes none. Treatment to date includes nothing. The patient is sexually active.   Past Medical History:  Diagnosis Date  . Carpal tunnel syndrome, right 06/2018  . Depression     Past Surgical History:  Procedure Laterality Date  . CARPAL TUNNEL RELEASE Right 06/28/2018   Procedure: RIGHT CARPAL TUNNEL RELEASE;  Surgeon: Tarry Kos, MD;  Location: [facility];  Service: Orthopedics;  Laterality: Right;  .  LAPAROSCOPIC APPENDECTOMY N/A 05/10/2016   Procedure: APPENDECTOMY LAPAROSCOPIC;  Surgeon: Jimmye NormanJames Wyatt, MD;  [facility];  Service: General;  Laterality: N/A;    Family History  Problem Relation Age of Onset  . Diabetes Mother     Social History   Socioeconomic History  . Marital status: Single    Spouse name: Not on file  . Number of children: 4  . Years of education: Not on file  . Highest education level: 9th grade  Occupational History  . Not on file  Social Needs  . Financial resource strain: Not on file  . Food insecurity:    Worry: Not on file     Inability: Not on file  . Transportation needs:    Medical: No    Non-medical: No  Tobacco Use  . Smoking status: Never Smoker  . Smokeless tobacco: Never Used  Substance and Sexual Activity  . Alcohol use: No  . Drug use: No  . Sexual activity: Yes    Birth control/protection: Pill  Lifestyle  . Physical activity:    Days per week: Not on file    Minutes per session: Not on file  . Stress: Not on file  Relationships  . Social connections:    Talks on phone: Not on file    Gets together: Not on file    Attends religious service: Not on file    Active member of club or organization: Not on file    Attends meetings of clubs or organizations: Not on file    Relationship status: Not on file  Other Topics Concern  . Not on file  Social History Narrative   Patient is right-handed. She lives with her children in a one level home. She drinks one cup of coffee a day. She has not been exercising lately.     Observations/Objective: Awake, alert and oriented x 3   Review of Systems  Constitutional: Negative for fever, malaise/fatigue and weight loss.  HENT: Negative.  Negative for nosebleeds.   Eyes: Negative.  Negative for blurred vision, double vision and photophobia.  Respiratory: Negative.  Negative for cough and shortness of breath.   Cardiovascular: Negative.  Negative for chest pain, palpitations and leg swelling.  Gastrointestinal: Negative.  Negative for heartburn, nausea and vomiting.  Genitourinary:       SEE HPI  Musculoskeletal: Negative.  Negative for myalgias.  Neurological: Negative.  Negative for dizziness, focal weakness, seizures and headaches.  Psychiatric/Behavioral: Positive for depression. Negative for memory loss and suicidal ideas. The patient is nervous/anxious. The patient does not have insomnia.     Assessment and Plan: Gardiner RhymeMarisol was evaluated today for follow-up.  Diagnoses and all orders for this visit:  Anxiety and depression -     hydrOXYzine  (ATARAX/VISTARIL) 50 MG tablet; Take 1 tablet (50 mg total) by mouth 3 (three) times daily as needed. -     escitalopram (LEXAPRO) 10 MG tablet; Take 1 tablet (10 mg total) by mouth daily for 30 days.  Abnormal uterine bleeding (AUB) Patient declines office UPT. Will continue to monitor. Instructed to take OCPs same time everyday.    Follow Up Instructions Return in about 3 weeks (around 02/21/2019) for Depression and Anxiety.     I discussed the assessment and treatment plan with the patient. The patient was provided an opportunity to ask questions and all were answered. The patient agreed with the plan and demonstrated an understanding of the instructions.   The patient was advised to call back  or seek an in-person evaluation if the symptoms worsen or if the condition fails to improve as anticipated.  I provided 26 minutes of non-face-to-face time during this encounter including median intraservice time, reviewing previous notes, labs, imaging, medications and explaining diagnosis and management.  Claiborne Rigg, FNP-BC

## 2019-02-01 MED ORDER — ERENUMAB-AOOE 70 MG/ML ~~LOC~~ SOAJ: 70.0000 mg | SUBCUTANEOUS | 11 refills | Status: DC

## 2019-02-01 MED ORDER — RIZATRIPTAN BENZOATE 10 MG PO TABS: 10.0000 mg | ORAL_TABLET | ORAL | 3 refills | Status: DC | PRN

## 2019-02-01 MED FILL — ?RIZATRIPTAN 10 MG TABLET: 10 | 15 days supply | Qty: 6 | Fill #0

## 2019-02-01 NOTE — Telephone Encounter (Signed)
Called interpreter line, spoke with Marlane Hatcher ID#  [phone]. He contact Pt. Pt does not have insurance, she has orange card and gets meds through [facility]. She is aware they do not carry the CGRP's there and that she will not be able to use the co-pay card. I gave her the information for the [employer] safety net to see if she qualifys for assitance. The Rx for Aimovig is to go to [facility] on [address]. Rx for rizatriptan to [facility].

## 2019-02-02 ENCOUNTER — Telehealth: Payer: Self-pay | Admitting: Neurology

## 2019-02-02 NOTE — Telephone Encounter (Signed)
Left with answering service on 02-01-19 @ 4:42    Caller states she was given a a RX that cost 400.00+ per month and needs an alternative

## 2019-02-02 NOTE — Telephone Encounter (Signed)
Patient needs to talk to someone about medication costing so much

## 2019-02-02 NOTE — Telephone Encounter (Signed)
Pt was provided the [employer] safety net foundation number yesterday during the call with the interpreter. She was advised how expensive Aimovig would be since she does not have insurance and is unable to use the co-pay card. The Pt verbalized her understanding and said she would call to see if she qualifies for assistance with the cost.  Called the interpreter line, spoke with interpreter Johnny Bridge QB#[id number], she contacted the Pt. Pt was upset that the safety net told her they would call her back, she has not rcvd a call back. She was expecting it the same day. I explained it can take sometimes several days before they are able to get the information together to start the process. The Pt said she understands.

## 2019-02-02 NOTE — Telephone Encounter (Signed)
Patient left another msg with after hours about medication cost. Please call her back. Thanks!

## 2019-02-05 ENCOUNTER — Telehealth: Payer: Self-pay | Admitting: Neurology

## 2019-02-05 NOTE — Telephone Encounter (Signed)
Pt called to inform that injection erenumab is $721 out-of-pocket. Pt wants to know if there is an alternative injection that is more affordable as pt does not have insurance.

## 2019-02-28 ENCOUNTER — Other Ambulatory Visit: Payer: Self-pay | Admitting: Nurse Practitioner

## 2019-03-06 ENCOUNTER — Ambulatory Visit: Payer: Self-pay | Attending: Nurse Practitioner | Admitting: Nurse Practitioner

## 2019-03-06 ENCOUNTER — Other Ambulatory Visit: Payer: Self-pay

## 2019-03-06 ENCOUNTER — Encounter: Payer: Self-pay | Admitting: Nurse Practitioner

## 2019-03-06 DIAGNOSIS — Z6841 Body Mass Index (BMI) 40.0 and over, adult: Secondary | ICD-10-CM

## 2019-03-06 DIAGNOSIS — G43719 Chronic migraine without aura, intractable, without status migrainosus: Secondary | ICD-10-CM

## 2019-03-06 NOTE — Progress Notes (Signed)
Virtual Visit via Telephone Note Due to national recommendations of social distancing due to [place] 19, telehealth visit is felt to be most appropriate for this patient at this time.  I discussed the limitations, risks, security and privacy concerns of performing an evaluation and management service by telephone and the availability of in person appointments. I also discussed with the patient that there may be a patient responsible charge related to this service. The patient expressed understanding and agreed to proceed.    I connected with Amber Travis on 03/06/19  at  11:10 AM EDT  EDT by telephone and verified that I am speaking with the correct person using two identifiers.   Consent I discussed the limitations, risks, security and privacy concerns of performing an evaluation and management service by telephone and the availability of in person appointments. I also discussed with the patient that there may be a patient responsible charge related to this service. The patient expressed understanding and agreed to proceed.   Location of Patient: Private residence   Location of Provider: [place] and [place] participating in Telemedicine visit: [name] FNP-BC [place]  Spanish Interpreter ID# [id number]    History of Present Illness: Telemedicine visit for: Migraines  Currently being treated for her migraines by Dr. Tomi Likens. I initially tried to manage her migraines however was unsuccessful so she was referred to Neurology. She has tried to increase her nortriptyline from 25 mg QD to 50 mg QD but states she felt her face starting to get numb so she could not continue with the increase. She has not tried to incorporate the following recommended medications:   magnesium citrate 400mg  daily, riboflavin 400mg  daily, and coenzyme Q10 100mg  three times daily.  Can not afford the Aimovig injections however I am not sure  if she has spoken with Dr. Georgie Chard office to let them know. She is waiting to hear back from their office. I have instructed her to contact Dr. Georgie Chard office regarding her concerns.  Depression screen [facility] 2/9 03/06/2019 01/31/2019 10/17/2018 08/18/2018 06/07/2018  Decreased Interest 0 1 1 1 1   Down, Depressed, Hopeless 0 3 0 1 3  PHQ - 2 Score 0 4 1 2 4   Altered sleeping 0 3 2 2 1   Tired, decreased energy 3 3 1 3 3   Change in appetite 0 2 3 3 3   Feeling bad or failure about yourself  0 0 1 1 1   Trouble concentrating 0 0 1 1 1   Moving slowly or fidgety/restless 0 0 1 1 1   Suicidal thoughts 0 0 0 0 0  PHQ-9 Score 3 12 10 13 14   Some recent data might be hidden      GAD 7 : Generalized Anxiety Score 03/06/2019 01/31/2019 08/18/2018 06/07/2018  Nervous, Anxious, on Edge 0 3 2 3   Control/stop worrying 0 3 1 3   Worry too much - different things 2 3 3 1   Trouble relaxing 2 3 2 3   Restless 0 3 1 1   Easily annoyed or irritable 2 0 3 3  Afraid - awful might happen 0 1 - 1  Total GAD 7 Score 6 16 - 15      Past Medical History:  Diagnosis Date  . Carpal tunnel syndrome, right 06/2018  . Depression     Past Surgical History:  Procedure Laterality Date  . CARPAL TUNNEL RELEASE Right 06/28/2018   Procedure: RIGHT CARPAL TUNNEL RELEASE;  Surgeon: Tarry KosXu, Naiping M, MD;  Location: [facility];  Service: Orthopedics;  Laterality: Right;  . LAPAROSCOPIC APPENDECTOMY N/A 05/10/2016   Procedure: APPENDECTOMY LAPAROSCOPIC;  Surgeon: Jimmye NormanJames Wyatt, MD;  [facility];  Service: General;  Laterality: N/A;    Family History  Problem Relation Age of Onset  . Diabetes Mother     Social History   Socioeconomic History  . Marital status: Single    Spouse name: Not on file  . Number of children: 4  . Years of education: Not on file  . Highest education level: 9th grade  Occupational History  . Not on file  Social Needs  . Financial resource strain: Not on file  . Food insecurity     Worry: Not on file    Inability: Not on file  . Transportation needs    Medical: No    Non-medical: No  Tobacco Use  . Smoking status: Never Smoker  . Smokeless tobacco: Never Used  Substance and Sexual Activity  . Alcohol use: No  . Drug use: No  . Sexual activity: Yes    Birth control/protection: Pill  Lifestyle  . Physical activity    Days per week: Not on file    Minutes per session: Not on file  . Stress: Not on file  Relationships  . Social Musicianconnections    Talks on phone: Not on file    Gets together: Not on file    Attends religious service: Not on file    Active member of club or organization: Not on file    Attends meetings of clubs or organizations: Not on file    Relationship status: Not on file  Other Topics Concern  . Not on file  Social History Narrative   Patient is right-handed. She lives with her children in a one level home. She drinks one cup of coffee a day. She has not been exercising lately.     Observations/Objective: Awake, alert and oriented x 3   Review of Systems  Constitutional: Negative for fever, malaise/fatigue and weight loss.       Feels hot sometimes and then feels cold  HENT: Negative.  Negative for nosebleeds.   Eyes: Positive for blurred vision and photophobia. Negative for double vision.  Respiratory: Negative.  Negative for cough and shortness of breath.   Cardiovascular: Negative.  Negative for chest pain, palpitations and leg swelling.  Gastrointestinal: Positive for nausea. Negative for heartburn and vomiting.  Genitourinary:       Metrorrhagia related to stress  Musculoskeletal: Negative.  Negative for myalgias.  Neurological: Positive for headaches. Negative for dizziness, tingling, tremors, sensory change, speech change, focal weakness, seizures, loss of consciousness and weakness.  Psychiatric/Behavioral: Negative.  Negative for suicidal ideas.    Assessment and Plan: Amber Travis was evaluated today for  fatigue.   Diagnoses and all orders for this visit:  Intractable chronic migraine without aura and without status migrainosus -     CBC; Future  Morbid obesity with BMI of 40.0-44.9, adult (HCC) -     Basic metabolic panel; Future -     Lipid panel; Future -     TSH; Future    Follow Up Instructions Return if symptoms worsen or fail to improve.    I discussed the assessment and treatment plan with the patient. The patient was provided an opportunity to ask questions and all were answered. The patient agreed with the plan and demonstrated an understanding of the instructions.   The  patient was advised to call back or seek an in-person evaluation if the symptoms worsen or if the condition fails to improve as anticipated.  I provided 18 minutes of non-face-to-face time during this encounter including median intraservice time, reviewing previous notes, labs, imaging, medications and explaining diagnosis and management.  Gildardo Pounds, FNP-BC

## 2019-03-12 ENCOUNTER — Ambulatory Visit: Payer: Self-pay | Attending: Nurse Practitioner

## 2019-03-12 ENCOUNTER — Other Ambulatory Visit: Payer: Self-pay

## 2019-03-12 DIAGNOSIS — Z6841 Body Mass Index (BMI) 40.0 and over, adult: Secondary | ICD-10-CM

## 2019-03-12 DIAGNOSIS — G43719 Chronic migraine without aura, intractable, without status migrainosus: Secondary | ICD-10-CM

## 2019-03-13 LAB — CBC
Hematocrit: 43.1 % (ref 34.0–46.6)
Hemoglobin: 14.5 g/dL (ref 11.1–15.9)
MCH: 31.9 pg (ref 26.6–33.0)
MCHC: 33.6 g/dL (ref 31.5–35.7)
MCV: 95 fL (ref 79–97)
Platelets: 284 10*3/uL (ref 150–450)
RBC: 4.55 x10E6/uL (ref 3.77–5.28)
RDW: 12.6 % (ref 11.7–15.4)
WBC: 8.5 10*3/uL (ref 3.4–10.8)

## 2019-03-13 LAB — BASIC METABOLIC PANEL
BUN/Creatinine Ratio: 9 (ref 9–23)
BUN: 6 mg/dL (ref 6–20)
CO2: 23 mmol/L (ref 20–29)
Calcium: 9.5 mg/dL (ref 8.7–10.2)
Chloride: 102 mmol/L (ref 96–106)
Creatinine, Ser: 0.64 mg/dL (ref 0.57–1.00)
GFR calc Af Amer: 131 mL/min/{1.73_m2} (ref >59)
GFR calc non Af Amer: 114 mL/min/{1.73_m2} (ref >59)
Glucose: 93 mg/dL (ref 65–99)
Potassium: 4.4 mmol/L (ref 3.5–5.2)
Sodium: 141 mmol/L (ref 134–144)

## 2019-03-13 LAB — LIPID PANEL
Chol/HDL Ratio: 4.1 ratio (ref 0.0–4.4)
Cholesterol, Total: 199 mg/dL (ref 100–199)
HDL: 49 mg/dL (ref >39)
LDL Calculated: 114 mg/dL — ABNORMAL HIGH (ref 0–99)
Triglycerides: 182 mg/dL — ABNORMAL HIGH (ref 0–149)
VLDL Cholesterol Cal: 36 mg/dL (ref 5–40)

## 2019-03-13 LAB — TSH: TSH: 2.31 u[IU]/mL (ref 0.450–4.500)

## 2019-03-28 ENCOUNTER — Telehealth: Payer: Self-pay | Admitting: Nurse Practitioner

## 2019-03-28 NOTE — Telephone Encounter (Signed)
Pt saw lab results on my chart, but would to be called back to speak about the results and how she should go about that..please follow up

## 2019-03-29 NOTE — Telephone Encounter (Signed)
CMA spoke to patient with spanish interpreter assist with the call for patient regarding her lab results.  Patient was inform to start eating healthy, watch her carbs intake, and start exercising for to lower cholesterol levels.

## 2019-04-06 ENCOUNTER — Other Ambulatory Visit: Payer: Self-pay

## 2019-04-06 ENCOUNTER — Ambulatory Visit: Payer: Self-pay | Attending: Family Medicine

## 2019-05-21 NOTE — Progress Notes (Signed)
NEUROLOGY FOLLOW UP OFFICE NOTE  Amber Travis [id number]  HISTORY OF PRESENT ILLNESS: Amber Travis is a 38 year old Spanish-speaking woman with anxiety who follows up for migraines.  Communication made via interpreter on phone [phone]).  UPDATE: Tried nortriptyline, but it caused her insomnia and aggravated depression.  We started Aimovig.  She is doing well.  Sumatriptan caused dizziness.  Unfortunately she has no insurance and Aimovig costs over $700 and she does not qualify for the copay card.  Intensity:  mild Duration:  40 minutes (took Tylenol) Frequency:  1 mild headache, no migraines Frequency of abortive medication: once Current NSAIDS:none Current analgesics:none Current triptans: none Current ergotamine: none Current anti-emetic: none Current muscle relaxants: none Current anti-anxiolytic: none Current sleep aide: none Current Antihypertensive medications: none Current Antidepressant medications: none Current Anticonvulsant medications: none Current anti-CGRP: Aimovig 70mg  Current Vitamins/Herbal/Supplements: none Current Antihistamines/Decongestants:Flonase Other therapy: none Hormone/birth control: Desogestrel/ethinyl estradiol, on it since around January (after headaches increased)  Caffeine:  1 cup of coffee daily Depression/Anxiety:  Anxiety Sleep hygiene:  Poor due to worrying   HISTORY: Onset:  In her 78s.  They have been frequent since December 2019.  She reports increased worrying that probably has increased her headaches.  She subsequently was seen in the ED on 12/03/18 for headache and received a headache cocktail of toradol 15mg , Compazine 10mg , and Benadryl 25mg . It was ineffective Location:  Bilateral temples and across forehead Quality:  throbbing Initial intensity:  severe.  She denies new headache, thunderclap headache Aura:  no Associated symptoms:  Photophobia, phonophobia, nausea, vomiting.  Blurred vision.  She denies associated unilateral numbness or weakness. Initial Duration: 3 days Initial Frequency:  6 days a week Triggers:  Emotional stress Relieving factors:  Rubbing alcohol and Vick's vapor rub on head and tie scarf around her head Activity:  aggravates  Past NSAIDS:naproxen Past analgesics:tramadol, Fioricet Past abortive triptans: sumatriptan 100mg  (dizziness) Past abortive ergotamine: none Past muscle relaxants:Robaxin Past anti-emetic:Zofran 8mg  Past antihypertensive medications: none Past antidepressant medications:Sertraline 200mg , nortriptyline 25mg  Past anticonvulsant medications:topiramate 50mg  Past anti-CGRP: none Other past therapies: none  Family history of headache:  Grandmother (migraines)  PAST MEDICAL HISTORY: Past Medical History:  Diagnosis Date  . Carpal tunnel syndrome, right 06/2018  . Depression     MEDICATIONS: Current Outpatient Medications on File Prior to Visit  Medication Sig Dispense Refill  . butalbital-acetaminophen-caffeine (FIORICET, ESGIC) 50-325-40 MG tablet Take 1 tablet by mouth every 6 (six) hours as needed for headache. 40 tablet 1  . Erenumab-aooe (AIMOVIG) 70 MG/ML SOAJ Inject 70 mg into the skin every 30 (thirty) days. (Patient not taking: Reported on 03/06/2019) 1 pen 11  . escitalopram (LEXAPRO) 10 MG tablet Take 1 tablet (10 mg total) by mouth daily for 30 days. 30 tablet 1  . fluticasone (FLONASE) 50 MCG/ACT nasal spray Place 2 sprays into both nostrils daily. 16 g 6  . hydrOXYzine (ATARAX/VISTARIL) 50 MG tablet Take 1 tablet (50 mg total) by mouth 3 (three) times daily as needed. 90 tablet 1  . ibuprofen (ADVIL) 600 MG tablet Take 1 tablet (600 mg total) by mouth every 8 (eight) hours as needed. Prn body aches or HA 45 tablet 0  . nortriptyline (PAMELOR) 25 MG capsule Take 1 capsule (25 mg total) by mouth at bedtime. 30 capsule 3  . rizatriptan (MAXALT) 10 MG tablet Take 1 tablet (10 mg total)  by mouth as needed for migraine. May repeat in 2 hours if needed 12 tablet  3  . SUMAtriptan (IMITREX) 100 MG tablet Take 1 tablet earliest onset of headache.  May repeat in 2 hours if headache persists or recurs.  Maximum 2 tablets in 24 hours (Patient not taking: Reported on 01/31/2019) 10 tablet 3  . UNABLE TO FIND Med Name: birth control pills    . [DISCONTINUED] ISIBLOOM 0.15-30 MG-MCG tablet TAKE 1 TABLET BY MOUTH EVERY DAY 28 tablet 2   No current facility-administered medications on file prior to visit.     ALLERGIES: No Known Allergies  FAMILY HISTORY: Family History  Problem Relation Age of Onset  . Diabetes Mother     SOCIAL HISTORY: Social History   Socioeconomic History  . Marital status: Single    Spouse name: Not on file  . Number of children: 4  . Years of education: Not on file  . Highest education level: 9th grade  Occupational History  . Not on file  Social Needs  . Financial resource strain: Not on file  . Food insecurity    Worry: Not on file    Inability: Not on file  . Transportation needs    Medical: No    Non-medical: No  Tobacco Use  . Smoking status: Never Smoker  . Smokeless tobacco: Never Used  Substance and Sexual Activity  . Alcohol use: No  . Drug use: No  . Sexual activity: Yes    Birth control/protection: Pill  Lifestyle  . Physical activity    Days per week: Not on file    Minutes per session: Not on file  . Stress: Not on file  Relationships  . Social Herbalist on phone: Not on file    Gets together: Not on file    Attends religious service: Not on file    Active member of club or organization: Not on file    Attends meetings of clubs or organizations: Not on file    Relationship status: Not on file  . Intimate partner violence    Fear of current or ex partner: Not on file    Emotionally abused: Not on file    Physically abused: Not on file    Forced sexual activity: Not on file  Other Topics Concern  . Not on  file  Social History Narrative   Patient is right-handed. She lives with her children in a one level home. She drinks one cup of coffee a day. She has not been exercising lately.    REVIEW OF SYSTEMS: Constitutional: No fevers, chills, or sweats, no generalized fatigue, change in appetite Eyes: No visual changes, double vision, eye pain Ear, nose and throat: No hearing loss, ear pain, nasal congestion, sore throat Cardiovascular: No chest pain, palpitations Respiratory:  No shortness of breath at rest or with exertion, wheezes GastrointestinaI: No nausea, vomiting, diarrhea, abdominal pain, fecal incontinence Genitourinary:  No dysuria, urinary retention or frequency Musculoskeletal:  No neck pain, back pain Integumentary: No rash, pruritus, skin lesions Neurological: as above Psychiatric: No depression, insomnia, anxiety Endocrine: No palpitations, fatigue, diaphoresis, mood swings, change in appetite, change in weight, increased thirst Hematologic/Lymphatic:  No purpura, petechiae. Allergic/Immunologic: no itchy/runny eyes, nasal congestion, recent allergic reactions, rashes  PHYSICAL EXAM: Blood pressure 100/66, pulse (!) 59, temperature 98.6 F (37 C), height 5' (1.524 m), weight 172 lb (78 kg), SpO2 99 %. General: No acute distress.  Patient appears well-groomed.   Head:  Normocephalic/atraumatic Eyes:  Fundi examined but not visualized Neck: supple, no paraspinal tenderness, full range of  motion Heart:  Regular rate and rhythm Lungs:  Clear to auscultation bilaterally Back: No paraspinal tenderness Neurological Exam: alert and oriented to person, place, and time. Attention span and concentration intact, recent and remote memory intact, fund of knowledge intact.  Speech fluent and not dysarthric, language intact.  CN II-XII intact. Bulk and tone normal, muscle strength 5/5 throughout.  Sensation to light touch  intact.  Deep tendon reflexes 2+ throughout, toes downgoing.  Finger  to nose testing intact.  Gait normal, Romberg negative.  IMPRESSION: Migraine without aura, without status migrainosus, not intractable.  Significant improvement on Aimovig.  Unfortunately, she is unable to afford it and does not qualify for any of the assistance programs.  PLAN: 1.  We will try venlafaxine XR 37.5mg  daily.  We can increase dose to 75mg  daily in 4 weeks if needed (she was advised to contact us with update) 2.  Limit use of pain relievers to no more than 2 days out of week to prevent risk of rebound or medication-overuse headache. 3.  Follow up in 4 months.  Shon MilletAdam Cailan Antonucci, DO  CC: Bertram DenverZelda Fleming, NP

## 2019-05-22 ENCOUNTER — Encounter: Payer: Self-pay | Admitting: Neurology

## 2019-05-22 ENCOUNTER — Ambulatory Visit (INDEPENDENT_AMBULATORY_CARE_PROVIDER_SITE_OTHER): Payer: Self-pay | Admitting: Neurology

## 2019-05-22 ENCOUNTER — Other Ambulatory Visit: Payer: Self-pay

## 2019-05-22 VITALS — BP 100/66 | HR 59 | Temp 98.6°F | Ht 60.0 in | Wt 172.0 lb

## 2019-05-22 DIAGNOSIS — G43009 Migraine without aura, not intractable, without status migrainosus: Secondary | ICD-10-CM

## 2019-05-22 MED ORDER — VENLAFAXINE HCL ER 37.5 MG PO CP24: 37.5000 mg | ORAL_CAPSULE | Freq: Every day | ORAL | 3 refills | Status: DC

## 2019-05-22 MED FILL — VENLAFAXINE HCL ER 37.5 MG: 37.5 | 30 days supply | Qty: 30 | Fill #0

## 2019-05-22 NOTE — Patient Instructions (Signed)
Start venlafaxine XR 37.5mg  daily.  If headaches not improved in 4 weeks, she should contact us and I can increase dose.  Follow up in 4 months.

## 2019-05-31 MED FILL — ?RIZATRIPTAN 10 MG TABLET: 10 | 15 days supply | Qty: 6 | Fill #1

## 2019-06-01 ENCOUNTER — Other Ambulatory Visit: Payer: Self-pay | Admitting: Neurology

## 2019-06-01 ENCOUNTER — Telehealth: Payer: Self-pay | Admitting: Neurology

## 2019-06-01 MED ORDER — VENLAFAXINE HCL ER 37.5 MG PO CP24: 37.5000 mg | ORAL_CAPSULE | Freq: Every day | ORAL | 3 refills | Status: DC

## 2019-06-01 MED ORDER — RIZATRIPTAN BENZOATE 10 MG PO TABS: 10.0000 mg | ORAL_TABLET | ORAL | 3 refills | Status: DC | PRN

## 2019-06-01 NOTE — Telephone Encounter (Signed)
She may be referring to Maxalt, which I refilled and sent to [facility], which is the pharmacy on file.

## 2019-06-01 NOTE — Telephone Encounter (Signed)
Patient is needing Pills for her headaches- to the pharm on file. She is unsure of name of medication. She was doing the injections but she said when she get a bad headache usually there is a pill that is called in to help her. That is what she is wanting. Thanks!

## 2019-06-12 ENCOUNTER — Other Ambulatory Visit: Payer: Self-pay

## 2019-06-12 ENCOUNTER — Encounter: Payer: Self-pay | Admitting: Nurse Practitioner

## 2019-06-12 ENCOUNTER — Ambulatory Visit: Payer: Self-pay | Attending: Nurse Practitioner | Admitting: Nurse Practitioner

## 2019-06-12 DIAGNOSIS — Z308 Encounter for other contraceptive management: Secondary | ICD-10-CM

## 2019-06-12 NOTE — Progress Notes (Signed)
Virtual Visit via Telephone Note Due to national recommendations of social distancing due to [place] 19, telehealth visit is felt to be most appropriate for this patient at this time.  I discussed the limitations, risks, security and privacy concerns of performing an evaluation and management service by telephone and the availability of in person appointments. I also discussed with the patient that there may be a patient responsible charge related to this service. The patient expressed understanding and agreed to proceed.    I connected with Amber Travis on 06/12/19  at   3:50 PM EDT  EDT by telephone and verified that I am speaking with the correct person using two identifiers.   Consent I discussed the limitations, risks, security and privacy concerns of performing an evaluation and management service by telephone and the availability of in person appointments. I also discussed with the patient that there may be a patient responsible charge related to this service. The patient expressed understanding and agreed to proceed.   Location of Patient: Private Residence   Location of Provider: [place] and [place] participating in Telemedicine visit: [name] FNP-BC [place] INTERPRETER [place] ID # [id number]   History of Present Illness: Telemedicine visit for: Contraception  She does not want to take birth control pills any more.  Interested in contraception that does not require daily administration. We discussed different birth control options and she decided on Depo provera. We will schedule her for a nurse visit. I discussed a few side effects today regarding Depo such as abnormal uterine bleeding, headaches and delayed pregnancy after cessation.   Past Medical History:  Diagnosis Date  . Carpal tunnel syndrome, right 06/2018  . Depression     Past Surgical History:  Procedure Laterality Date  .  CARPAL TUNNEL RELEASE Right 06/28/2018   Procedure: RIGHT CARPAL TUNNEL RELEASE;  Surgeon: Leandrew Koyanagi, MD;  Location: [place];  Service: Orthopedics;  Laterality: Right;  . LAPAROSCOPIC APPENDECTOMY N/A 05/10/2016   Procedure: APPENDECTOMY LAPAROSCOPIC;  Surgeon: Judeth Horn, MD;  Location: [facility];  Service: General;  Laterality: N/A;    Family History  Problem Relation Age of Onset  . Diabetes Mother     Social History   Socioeconomic History  . Marital status: Single    Spouse name: Not on file  . Number of children: 4  . Years of education: Not on file  . Highest education level: 9th grade  Occupational History  . Not on file  Social Needs  . Financial resource strain: Not on file  . Food insecurity    Worry: Not on file    Inability: Not on file  . Transportation needs    Medical: No    Non-medical: No  Tobacco Use  . Smoking status: Never Smoker  . Smokeless tobacco: Never Used  Substance and Sexual Activity  . Alcohol use: No  . Drug use: No  . Sexual activity: Yes    Birth control/protection: Pill  Lifestyle  . Physical activity    Days per week: Not on file    Minutes per session: Not on file  . Stress: Not on file  Relationships  . Social Herbalist on phone: Not on file    Gets together: Not on file    Attends religious service: Not on file    Active member of club or organization: Not on file  Attends meetings of clubs or organizations: Not on file    Relationship status: Not on file  Other Topics Concern  . Not on file  Social History Narrative   Patient is right-handed. She lives with her children in a one level home. She drinks one cup of coffee a day. She has not been exercising lately. Speaks spanish .     Observations/Objective: Awake, alert and oriented x 3   Review of Systems  Constitutional: Negative for fever, malaise/fatigue and weight loss.  HENT: Negative.  Negative for nosebleeds.   Eyes: Negative.   Negative for blurred vision, double vision and photophobia.  Respiratory: Negative.  Negative for cough and shortness of breath.   Cardiovascular: Negative.  Negative for chest pain, palpitations and leg swelling.  Gastrointestinal: Negative.  Negative for heartburn, nausea and vomiting.  Musculoskeletal: Negative for joint pain and myalgias.  Neurological: Negative.  Negative for dizziness, focal weakness, seizures and headaches.  Psychiatric/Behavioral: Negative.  Negative for suicidal ideas.    Assessment and Plan:  Diagnoses and all orders for this visit:  Encounter for other contraceptive management Return for schedule DEPO.     I discussed the assessment and treatment plan with the patient. The patient was provided an opportunity to ask questions and all were answered. The patient agreed with the plan and demonstrated an understanding of the instructions.   The patient was advised to call back or seek an in-person evaluation if the symptoms worsen or if the condition fails to improve as anticipated.  I provided 14 minutes of non-face-to-face time during this encounter including median intraservice time, reviewing previous notes, labs, imaging, medications and explaining diagnosis and management.  Claiborne Rigg, FNP-BC

## 2019-06-13 ENCOUNTER — Ambulatory Visit: Payer: Self-pay | Attending: Family Medicine

## 2019-06-13 ENCOUNTER — Other Ambulatory Visit: Payer: Self-pay

## 2019-06-13 ENCOUNTER — Ambulatory Visit (HOSPITAL_BASED_OUTPATIENT_CLINIC_OR_DEPARTMENT_OTHER): Payer: Self-pay | Admitting: Pharmacist

## 2019-06-13 DIAGNOSIS — Z23 Encounter for immunization: Secondary | ICD-10-CM

## 2019-06-13 DIAGNOSIS — Z308 Encounter for other contraceptive management: Secondary | ICD-10-CM

## 2019-06-13 LAB — POCT URINE PREGNANCY: Preg Test, Ur: NEGATIVE

## 2019-06-13 MED ORDER — MEDROXYPROGESTERONE ACETATE 150 MG/ML IM SUSP
150.0000 mg | Freq: Once | INTRAMUSCULAR | Status: AC
  Administered 2019-06-13: 11:00:00 150 mg via INTRAMUSCULAR

## 2019-06-13 NOTE — Progress Notes (Signed)
Pt. Came into the clinic for Depo Shot.   Pt. Depo injection was given on the right Ventrogluteal.  Pt. Tolerated well. Pt. Was inform to schedule the next depo shot.   Pt. Understood.   Spanish pac[place] interpreter assist with the call.

## 2019-06-14 NOTE — Progress Notes (Signed)
Patient presents for vaccination against influenza per orders of Zelda. Consent given. Counseling provided. No contraindications exists. Vaccine administered without incident.   

## 2019-06-20 ENCOUNTER — Ambulatory Visit: Payer: Self-pay

## 2019-07-02 ENCOUNTER — Telehealth: Payer: Self-pay | Admitting: Nurse Practitioner

## 2019-07-02 NOTE — Telephone Encounter (Signed)
Midol over the counter

## 2019-07-02 NOTE — Telephone Encounter (Signed)
Patient called stating her menstrual cycle 2 days ago and

## 2019-07-02 NOTE — Telephone Encounter (Signed)
Patient called stating her menstrual cycle started two days ago and her lower abdomen his hurting. Patient would like to know what she can take for pain. Please f/u

## 2019-07-04 NOTE — Telephone Encounter (Signed)
Attempt to reach patient regarding her concerns. No answer and LVM for a call back.

## 2019-07-05 ENCOUNTER — Ambulatory Visit: Payer: Self-pay

## 2019-07-12 ENCOUNTER — Telehealth: Payer: Self-pay | Admitting: Nurse Practitioner

## 2019-07-12 NOTE — Telephone Encounter (Signed)
Patient called wanting to know if she could be referred to an OBGYN office states she has been having spotting and bleeding like she is having a hemorrhage along with abdominal pain. Please follow up.

## 2019-07-13 ENCOUNTER — Other Ambulatory Visit: Payer: Self-pay | Admitting: Nurse Practitioner

## 2019-07-13 DIAGNOSIS — N939 Abnormal uterine and vaginal bleeding, unspecified: Secondary | ICD-10-CM

## 2019-07-13 NOTE — Telephone Encounter (Signed)
Referral has been placed to women's health. However if she is having new uncontrollable vaginal bleeding she needs to go to the Emergency room

## 2019-08-21 ENCOUNTER — Encounter: Payer: Self-pay | Admitting: Nurse Practitioner

## 2019-08-21 ENCOUNTER — Ambulatory Visit: Payer: Self-pay | Attending: Nurse Practitioner | Admitting: Nurse Practitioner

## 2019-08-21 ENCOUNTER — Other Ambulatory Visit: Payer: Self-pay

## 2019-08-21 DIAGNOSIS — Z131 Encounter for screening for diabetes mellitus: Secondary | ICD-10-CM

## 2019-08-21 DIAGNOSIS — N939 Abnormal uterine and vaginal bleeding, unspecified: Secondary | ICD-10-CM

## 2019-08-21 DIAGNOSIS — N393 Stress incontinence (female) (male): Secondary | ICD-10-CM

## 2019-08-21 DIAGNOSIS — Z130 Encounter for screening for diseases of the blood and blood-forming organs and certain disorders involving the immune mechanism: Secondary | ICD-10-CM

## 2019-08-21 DIAGNOSIS — N938 Other specified abnormal uterine and vaginal bleeding: Secondary | ICD-10-CM

## 2019-08-21 NOTE — Patient Instructions (Signed)
Incontinencia urinaria Urinary Incontinence  La incontinencia urinaria es una afeccin en la cual una persona no puede controlar dnde y Financial risk analyst. Ardelia Mems persona con esta afeccin orinar en un momento y Engineer, civil (consulting) no deseados (involuntariamente). Cules son las causas? Esta afeccin puede ser causada por lo siguiente:  Medicamentos.  Infecciones.  Estreimiento.  Msculos de la vejiga hiperactivos.  Msculos de la vejiga dbiles.  Msculos del suelo de la pelvis dbiles. Estos msculos proporcionan apoyo a la vejiga, el intestino y el tero, en el caso de las [place].  Aumento del tamao de la prstata en los hombres. La prstata es una glndula cerca de la vejiga. Cuando se [place], puede comprimir la [place]. Si la uretra est obstruida, la vejiga puede debilitarse y perder la capacidad para vaciarse correctamente.  Ciruga.  Factores emocionales, como la ansiedad, el estrs o el trastorno de estrs postraumtico (TEP).  Prolapso de los rganos de la pelvis. Esto ocurre en las mujeres, cuando los rganos se salen de Environmental consultant, hacia la vagina. Este cambio de lugar puede evitar que la vejiga y la uretra funcionen correctamente. Qu incrementa el riesgo? Los siguientes factores pueden hacer que usted sea ms propenso a Best boy esta afeccin:  Edad avanzada.  Obesidad e inactividad fsica.  Embarazo y [place].  Menopausia.  Las enfermedades que [employer] nervios o la mdula espinal (enfermedades neurolgicas).  Tos de larga duracin (crnica). Esto puede aumentar la presin en los msculos de la vejiga y del suelo plvico. Cules son los signos o los sntomas? Los sntomas pueden variar, segn el tipo de incontinencia urinaria que tenga. Estos incluyen los siguientes:  Una necesidad repentina de Garment/textile technologist, y orinar involuntariamente antes de llegar al bao (incontinencia imperiosa).  Orinar repentinamente al realizar una actividad que provoque la miccin, como toser, rer,  Field seismologist ejercicio o estornudar (incontinencia urinaria de esfuerzo).  Tener ganas de orinar con frecuencia, y Garment/textile technologist en pequeas cantidades o por goteo (incontinencia por rebosamiento).  Orinar porque no puede llegar al bao a tiempo a causa de una discapacidad fsica, como la artritis o una lesin, o de afecciones comunicativas o cognitivas, como la enfermedad de Alzheimer (incontinencia funcional). Cmo se diagnostica? Esta afeccin se puede diagnosticar en funcin de lo siguiente:  Sus antecedentes mdicos.  Un examen fsico.  Estudios, como por ejemplo: ? [place] de [place]. ? Radiografas del rin y de la vejiga. ? Ecografa. ? Exploracin por tomografa computarizada (TC). ? Cistoscopia. En este procedimiento, el mdico inserta un tubo con Ardelia Mems luz y Carlota Raspberry (cistoscopio) por la uretra hacia la vejiga para Education officer, environmental. ? Estudios urodinmicos. Estos estudios evalan la capacidad de la vejiga, de la uretra y del esfnter para Financial controller y Radio broadcast assistant orina. Hay distintos tipos de estudios urodinmicos, que pueden variar segn lo que Geophysical data processor. Para ayudar a diagnosticar la afeccin, el mdico puede recomendarle que mantenga un registro de la frecuencia con la que [place] y de la cantidad de [place]. Cmo se trata? El tratamiento de esta afeccin depende del tipo de incontinencia que tenga y de su causa. El tratamiento puede incluir lo siguiente:  Cambios en el estilo de vida, como por ejemplo: ? Dejar de fumar. ? Mantener un peso saludable. ? Mantenerse activo. Trate de hacer 156mnutos de ejercicio de intensidad moderada cada semana. Pregntele al mdico qu actividades son seguras para usted. ? Consumir una dieta saludable.  Evite los alimentos con alto contenido de grasa, como los alimentos fritos.  Reduzca el consumo de carbohidratos refinados,  como el pan blanco y el arroz blanco.  Limite la cantidad de cafena que consume.  Aumente el consumo de [place].  Los alimentos como frutas y verduras frescas, los frijoles y los cereales integrales son fuentes saludables de [place].  Ejercicios para el msculo del piso plvico.  Ejercitacin de la vejiga, como prolongar la cantidad de [employer] las idas al bao o ir al bao en intervalos regulares.  Aplicar tcnicas para reducir las urgencias de la vejiga. Esto puede incluir tcnicas de distraccin o ejercicios de respiracin controlada.  Medicamentos para Media planner los msculos de la vejiga y Product/process development scientist los espasmos de la vejiga.  Medicamentos para disminuir o Geographical information systems officer de la prstata, en el caso de los hombres.  Inyecciones de btox. Esto puede ayudar a [employer] de la vejiga.  Usar pulsos elctricos para alterar los reflejos de la vejiga (estimulacin nerviosa elctrica).  En el caso de las [place] un dispositivo mdico para evitar prdidas de [place]. Este es un dispositivo pequeo y Insurance risk surveyor, parecido a un tampn, que se inserta en la uretra.  Inyectar colgeno o perlas de carbono (espesantes) en el esfnter urinario. Esto puede ayudar a espesar el tejido y cerrar la abertura de la vejiga.  Ciruga. Siga estas indicaciones en su casa: Estilo de vida  Limite el consumo de alcohol y cafena. Estos pueden llenar la vejiga rpidamente e irritarla.  Mantenga su higiene personal para evitar malos olores y lesiones cutneas. Pregntele al mdico acerca de cremas y productos para la piel que puedan proteger la piel de la [place].  Considere usar compresas o paales para adultos. Asegrese de cambiarlos con regularidad y siempre cmbielos tras un episodio de incontinencia. Instrucciones generales  [employer] de venta libre y los recetados solamente como se lo haya indicado el mdico.  Trate de ir al bao cada 3 o 4 horas, aunque no sienta la necesidad de Garment/textile technologist. Tmese el tiempo para vaciar la vejiga completamente cada vez que orine. Despus de orinar, espere un  minuto. Luego trate de orinar nuevamente.  Adopte una posicin cmoda para orinar.  Si la causa de su incontinencia son problemas nerviosos, lleve un registro de los medicamentos que toma y de las veces que vaya al bao.  Concurra a todas las visitas de seguimiento como se lo haya indicado el mdico. Esto es importante. Comunquese con un mdico si:  Siente un dolor que empeora.  La incontinencia empeora. Solicite ayuda de inmediato si:  Tiene fiebre o siente escalofros.  No puede orinar.  Tiene enrojecimiento en la zona de la ingle o en las piernas. Resumen  La incontinencia urinaria es una afeccin en la cual una persona no puede controlar dnde y Financial risk analyst.  Esta afeccin puede ser causada por medicamentos, infecciones, debilidad en los msculos de la vejiga, debilidad en los msculos del suelo plvico, agrandamiento de la prstata (en los hombres) o [place].  Los siguientes factores aumentan el riesgo de tener esta afeccin: Salena Saner, obesidad, [place], menopausia, enfermedades neurolgicas y tos crnica.  Hay muchos tipos de incontinencia urinaria. Estos incluyen incontinencia imperiosa, incontinencia urinaria de esfuerzo, incontinencia por rebosamiento e incontinencia funcional.  Por lo general, esta afeccin se trata primero con cambios en el estilo de vida y el comportamiento, como dejar de fumar, adoptar una alimentacin saludable y Field seismologist ejercicios para el suelo plvico con regularidad. Otras opciones de tratamiento incluyen medicamentos, espesantes, dispositivos mdicos, neuroestimulacin elctrica o ciruga. Esta informacin no tiene Marine scientist  el consejo del mdico. Asegrese de hacerle al mdico cualquier pregunta que tenga. Document Released: 08/23/2005 Document Revised: 09/02/2017 Document Reviewed: 03/29/2017 Elsevier Patient Education  [place].

## 2019-08-21 NOTE — Progress Notes (Signed)
Virtual Visit via Telephone Note Due to national recommendations of social distancing due to [place] 19, telehealth visit is felt to be most appropriate for this patient at this time.  I discussed the limitations, risks, security and privacy concerns of performing an evaluation and management service by telephone and the availability of in person appointments. I also discussed with the patient that there may be a patient responsible charge related to this service. The patient expressed understanding and agreed to proceed.    I connected with Amber Travis on 09/02/19  at   9:50 AM EST  EDT by telephone and verified that I am speaking with the correct person using two identifiers.   Consent I discussed the limitations, risks, security and privacy concerns of performing an evaluation and management service by telephone and the availability of in person appointments. I also discussed with the patient that there may be a patient responsible charge related to this service. The patient expressed understanding and agreed to proceed.   Location of Patient: Private Residence   Location of Provider: [place] [facility] [place] Office    Persons participating in Telemedicine visit: [name] FNP-BC [place] ID [id number]   History of Present Illness: Telemedicine visit for: Stress incontinence  Urinary Incontinence Patient complains of urinary incontinence. This has been present for several months. She leaks urine with bending, standing, exercise. Patient describes the symptoms as  urine leakage with coughing/heavy physical activity and urine leaking unpredictably.  Factors associated with symptoms include none known. Evaluation to date includes none. She has a history of AUB. Reports spotting in between menstrual cycles.      Past Medical History:  Diagnosis Date  . Carpal tunnel syndrome, right 06/2018  . Depression      Past Surgical History:  Procedure Laterality Date  . CARPAL TUNNEL RELEASE Right 06/28/2018   Procedure: RIGHT CARPAL TUNNEL RELEASE;  Surgeon: Leandrew Koyanagi, MD;  Location: [place];  Service: Orthopedics;  Laterality: Right;  . LAPAROSCOPIC APPENDECTOMY N/A 05/10/2016   Procedure: APPENDECTOMY LAPAROSCOPIC;  Surgeon: Judeth Horn, MD;  [facility];  Service: General;  Laterality: N/A;    Family History  Problem Relation Age of Onset  . Diabetes Mother     Social History   Socioeconomic History  . Marital status: Single    Spouse name: Not on file  . Number of children: 4  . Years of education: Not on file  . Highest education level: 9th grade  Occupational History  . Not on file  Tobacco Use  . Smoking status: Never Smoker  . Smokeless tobacco: Never Used  Substance and Sexual Activity  . Alcohol use: No  . Drug use: No  . Sexual activity: Yes    Birth control/protection: Pill  Other Topics Concern  . Not on file  Social History Narrative   Patient is right-handed. She lives with her children in a one level home. She drinks one cup of coffee a day. She has not been exercising lately. Speaks spanish .   Social Determinants of Health   Financial Resource Strain:   . Difficulty of Paying Living Expenses: Not on file  Food Insecurity:   . Worried About Charity fundraiser in the Last Year: Not on file  . Ran Out of Food in the Last Year: Not on file  Transportation Needs: No Transportation Needs  . Lack of Transportation (Medical): No  . Lack  of Transportation (Non-Medical): No  Physical Activity:   . Days of Exercise per Week: Not on file  . Minutes of Exercise per Session: Not on file  Stress:   . Feeling of Stress : Not on file  Social Connections:   . Frequency of Communication with Friends and Family: Not on file  . Frequency of Social Gatherings with Friends and Family: Not on file  . Attends Religious Services: Not on file  . Active Member  of Clubs or Organizations: Not on file  . Attends Archivist Meetings: Not on file  . Marital Status: Not on file     Observations/Objective: Awake, alert and oriented x 3   Review of Systems  Constitutional: Negative for fever, malaise/fatigue and weight loss.  HENT: Negative.  Negative for nosebleeds.   Eyes: Negative.  Negative for blurred vision, double vision and photophobia.  Respiratory: Negative.  Negative for cough and shortness of breath.   Cardiovascular: Negative.  Negative for chest pain, palpitations and leg swelling.  Gastrointestinal: Negative.  Negative for heartburn, nausea and vomiting.  Genitourinary: Negative for dysuria, flank pain, frequency, hematuria and urgency.       SEE HPI  Musculoskeletal: Negative.  Negative for myalgias.  Neurological: Negative.  Negative for dizziness, focal weakness, seizures and headaches.  Psychiatric/Behavioral: Positive for depression. Negative for suicidal ideas.    Assessment and Plan: Amber Travis was seen today for urinary frequency.  Diagnoses and all orders for this visit:  Stress incontinence -     Urinalysis, Complete -     TSH -     CMP14+EGFR -     Urine Culture -     Microscopic Examination Work on weight loss Avoid caffeine Practice Kegel exercises daily as discussed If labs normal will need to schedule PELVIC US   Abnormal uterine bleeding (AUB) -     hCG, quantitative, pregnancy -     Beta hCG quant (ref lab) -     Beta hCG quant (ref lab)  Screening for deficiency anemia -     CBC  Encounter for screening for diabetes mellitus -     A1c     Follow Up Instructions Return if symptoms worsen or fail to improve.     I discussed the assessment and treatment plan with the patient. The patient was provided an opportunity to ask questions and all were answered. The patient agreed with the plan and demonstrated an understanding of the instructions.   The patient was advised to call back or seek  an in-person evaluation if the symptoms worsen or if the condition fails to improve as anticipated.  I provided 19 minutes of non-face-to-face time during this encounter including median intraservice time, reviewing previous notes, labs, imaging, medications and explaining diagnosis and management.  Gildardo Pounds, FNP-BC

## 2019-08-22 ENCOUNTER — Telehealth: Payer: Self-pay | Admitting: Nurse Practitioner

## 2019-08-22 LAB — HEMOGLOBIN A1C
Est. average glucose Bld gHb Est-mCnc: 103 mg/dL
Hgb A1c MFr Bld: 5.2 % (ref 4.8–5.6)

## 2019-08-22 LAB — CMP14+EGFR
ALT: 20 IU/L (ref 0–32)
AST: 22 IU/L (ref 0–40)
Albumin/Globulin Ratio: 1.7 (ref 1.2–2.2)
Albumin: 4.7 g/dL (ref 3.8–4.8)
Alkaline Phosphatase: 80 IU/L (ref 39–117)
BUN/Creatinine Ratio: 27 — ABNORMAL HIGH (ref 9–23)
BUN: 18 mg/dL (ref 6–20)
Bilirubin Total: 1.5 mg/dL — ABNORMAL HIGH (ref 0.0–1.2)
CO2: 23 mmol/L (ref 20–29)
Calcium: 9.9 mg/dL (ref 8.7–10.2)
Chloride: 99 mmol/L (ref 96–106)
Creatinine, Ser: 0.66 mg/dL (ref 0.57–1.00)
GFR calc Af Amer: 130 mL/min/{1.73_m2} (ref >59)
GFR calc non Af Amer: 112 mL/min/{1.73_m2} (ref >59)
Globulin, Total: 2.7 g/dL (ref 1.5–4.5)
Glucose: 86 mg/dL (ref 65–99)
Potassium: 3.8 mmol/L (ref 3.5–5.2)
Sodium: 136 mmol/L (ref 134–144)
Total Protein: 7.4 g/dL (ref 6.0–8.5)

## 2019-08-22 LAB — CBC
Hematocrit: 43.2 % (ref 34.0–46.6)
Hemoglobin: 15 g/dL (ref 11.1–15.9)
MCH: 33 pg (ref 26.6–33.0)
MCHC: 34.7 g/dL (ref 31.5–35.7)
MCV: 95 fL (ref 79–97)
Platelets: 234 10*3/uL (ref 150–450)
RBC: 4.54 x10E6/uL (ref 3.77–5.28)
RDW: 12.3 % (ref 11.7–15.4)
WBC: 8.5 10*3/uL (ref 3.4–10.8)

## 2019-08-22 LAB — MICROSCOPIC EXAMINATION: Casts: NONE SEEN /lpf

## 2019-08-22 LAB — URINALYSIS, COMPLETE
Bilirubin, UA: NEGATIVE
Glucose, UA: NEGATIVE
Ketones, UA: NEGATIVE
Leukocytes,UA: NEGATIVE
Nitrite, UA: NEGATIVE
Specific Gravity, UA: 1.015 (ref 1.005–1.030)
Urobilinogen, Ur: 0.2 mg/dL (ref 0.2–1.0)
pH, UA: 5.5 (ref 5.0–7.5)

## 2019-08-22 LAB — BETA HCG QUANT (REF LAB): hCG Quant: <1 m[IU]/mL

## 2019-08-22 LAB — TSH: TSH: 1.29 u[IU]/mL (ref 0.450–4.500)

## 2019-08-22 NOTE — Telephone Encounter (Signed)
Patient called saying she received her results through [place]. Patient states she does not understand them and requested to speak with her PCP. Told patient that her PCP has not reviewed her results and would call her once she has reviewed them. Please f/u

## 2019-08-23 LAB — URINE CULTURE

## 2019-08-23 NOTE — Telephone Encounter (Signed)
Will route to PCP regarding pt. Called regarding her lab results.

## 2019-08-26 NOTE — Telephone Encounter (Signed)
Notes sent via mychart

## 2019-08-27 ENCOUNTER — Other Ambulatory Visit: Payer: Self-pay

## 2019-08-27 ENCOUNTER — Ambulatory Visit (INDEPENDENT_AMBULATORY_CARE_PROVIDER_SITE_OTHER): Payer: Self-pay | Admitting: Obstetrics and Gynecology

## 2019-08-27 ENCOUNTER — Encounter: Payer: Self-pay | Admitting: Obstetrics and Gynecology

## 2019-08-27 VITALS — BP 108/65 | HR 60 | Wt 166.2 lb

## 2019-08-27 DIAGNOSIS — F4323 Adjustment disorder with mixed anxiety and depressed mood: Secondary | ICD-10-CM

## 2019-08-27 DIAGNOSIS — Z23 Encounter for immunization: Secondary | ICD-10-CM

## 2019-08-27 DIAGNOSIS — N393 Stress incontinence (female) (male): Secondary | ICD-10-CM

## 2019-08-27 DIAGNOSIS — Z789 Other specified health status: Secondary | ICD-10-CM

## 2019-08-27 DIAGNOSIS — N921 Excessive and frequent menstruation with irregular cycle: Secondary | ICD-10-CM

## 2019-08-27 DIAGNOSIS — F32A Depression, unspecified: Secondary | ICD-10-CM

## 2019-08-27 DIAGNOSIS — Z3202 Encounter for pregnancy test, result negative: Secondary | ICD-10-CM

## 2019-08-27 DIAGNOSIS — F329 Major depressive disorder, single episode, unspecified: Secondary | ICD-10-CM

## 2019-08-27 LAB — POCT PREGNANCY, URINE: Preg Test, Ur: NEGATIVE

## 2019-08-27 NOTE — Progress Notes (Signed)
Obstetrics and Gynecology New Patient Evaluation  Appointment Date: 08/27/2019  OBGYN Clinic: [facility]  Primary Care Provider: Claiborne Rigg  Referring Provider: Claiborne Rigg, NP  Chief Complaint:  Chief Complaint  Patient presents with  . Abnormal Uterine Bleeding    History of Present Illness: Amber Travis is a 38 y.o. Hispanic [id number] (Patient's last menstrual period was 07/30/2019.), seen for the above chief complaint. Her past medical history for psych issues  Patient referred by PCP for AUB. Patient started on depo provera on 06/13/2019 by PCP and patient states she's had AUB since then. Prior OCP use that caused mood issues, no prior depo use. No bleeding currently, pain  Patient states she has some SUI s/s, no OAB s/s.    Review of Systems: Please see the history of present illness. All other systems reviewed and are negative    Patient Active Problem List   Diagnosis Date Noted  . Language barrier 09/03/2019  . Breakthrough bleeding on depo provera 09/03/2019  . Morbid obesity with BMI of 40.0-44.9, adult (HCC) 03/06/2019  . S/P carpal tunnel release 08/10/2018  . Right carpal tunnel syndrome 06/28/2018  . Pelvic pain 06/13/2017  . 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 Breast lump in female 02/04/2015  . DENTAL CARIES 10/07/2010  . HYPERBILIRUBINEMIA 07/09/2010  . SKIN RASH 05/14/2010  . ANXIETY DEPRESSION 05/04/2010  . FIBROCYSTIC BREAST DISEASE 05/04/2010  . UNSPECIFIED ADJUSTMENT REACTION 04/17/2009  . MIGRAINE HEADACHE 01/27/2009  . Depression 06/11/2008    Past Medical History:  Past Medical History:  Diagnosis Date  . Carpal tunnel syndrome, right 06/2018  . Depression     Past Surgical History:  Past Surgical History:  Procedure Laterality Date  . CARPAL TUNNEL RELEASE Right 06/28/2018   Procedure: RIGHT CARPAL TUNNEL RELEASE;  Surgeon: Tarry Kos, MD;  [facility];  Service: Orthopedics;  Laterality: Right;  . LAPAROSCOPIC APPENDECTOMY N/A 05/10/2016   Procedure: APPENDECTOMY LAPAROSCOPIC;  Surgeon: Jimmye Norman, MD;  Location[facility];  Service: General;  Laterality: N/A;    Past Obstetrical History:  OB History  Gravida Para Term Preterm AB Living  5 4 4  0 1 4  SAB TAB Ectopic Multiple Live Births  1 0 0 0 4    # Outcome Date GA Lbr Len/2nd Weight Sex Delivery Anes PTL Lv  5 Term 05/23/13 [redacted]w[redacted]d 13:07 / 00:11 7 lb 7.2 oz (3.38 kg) M Vag-Spont EPI  LIV  4 SAB           3 Term      Vag-Spont   LIV  2 Term      Vag-Spont  N LIV  1 Term      Vag-Spont  N LIV    Past Gynecological History: As per HPI. History of Pap Smear(s): Yes.   Last pap 2019, which was neg She is currently using bilateral tubal ligation for contraception.   Social History:  Social History   Socioeconomic History  . Marital status: Single    Spouse name: Not on file  . Number of children: 4  . Years of education: Not on file  . Highest education level: 9th grade  Occupational History  . Not on file  Tobacco Use  . Smoking status: Never Smoker  . Smokeless tobacco: Never Used  Substance and Sexual Activity  . Alcohol use: No  . Drug use: No  . Sexual activity: Yes  Birth control/protection: Pill  Other Topics Concern  . Not on file  Social History Narrative   Patient is right-handed. She lives with her children in a one level home. She drinks one cup of coffee a day. She has not been exercising lately. Speaks spanish .   Social Determinants of Health   Financial Resource Strain:   . Difficulty of Paying Living Expenses: Not on file  Food Insecurity:   . Worried About Charity fundraiser in the Last Year: Not on file  . Ran Out of Food in the Last Year: Not on file  Transportation Needs: No Transportation Needs  . Lack of Transportation (Medical): No  . Lack of Transportation (Non-Medical): No  Physical Activity:   . Days of Exercise per  Week: Not on file  . Minutes of Exercise per Session: Not on file  Stress:   . Feeling of Stress : Not on file  Social Connections:   . Frequency of Communication with Friends and Family: Not on file  . Frequency of Social Gatherings with Friends and Family: Not on file  . Attends Religious Services: Not on file  . Active Member of Clubs or Organizations: Not on file  . Attends Archivist Meetings: Not on file  . Marital Status: Not on file  Intimate Partner Violence:   . Fear of Current or Ex-Partner: Not on file  . Emotionally Abused: Not on file  . Physically Abused: Not on file  . Sexually Abused: Not on file    Family History:  Family History  Problem Relation Age of Onset  . Diabetes Mother     Medications Dawnyel E. Olene Craven had no medications administered during this visit. Current Outpatient Medications  Medication Sig Dispense Refill  . ibuprofen (ADVIL) 600 MG tablet Take 1 tablet (600 mg total) by mouth every 8 (eight) hours as needed. Prn body aches or HA 45 tablet 0  . Erenumab-aooe (AIMOVIG) 70 MG/ML SOAJ Inject 70 mg into the skin every 30 (thirty) days. 1 pen 11   No current facility-administered medications for this visit.    Allergies Patient has no known allergies.   Physical Exam:  BP 108/65   Pulse 60   Wt 166 lb 3.2 oz (75.4 kg)   LMP 07/30/2019   BMI 32.46 kg/m  Body mass index is 32.46 kg/m. General appearance: Well nourished, well developed female in no acute distress.  Cardiovascular: normal s1 and s2.  No murmurs, rubs or gallops. Respiratory:  Clear to auscultation bilateral. Normal respiratory effort Abdomen: positive bowel sounds and no masses, hernias; diffusely non tender to palpation, non distended Neuro/Psych:  Normal mood and affect.  Skin:  Warm and dry.  Lymphatic:  No inguinal lymphadenopathy.   Pelvic exam: is not limited by body habitus EGBUS: within normal limits, Vagina: within normal limits and with  no blood or discharge in the vault, very mild cystocele. Cervix: normal appearing cervix without tenderness, discharge or lesions. Uterus:  nonenlarged and non tender and Adnexa:  normal adnexa and no mass, fullness, tenderness Rectovaginal: deferred  Laboratory:  UPT neg 12/15: UCx neg 11/2018: neg wet prep, gc/ct  Radiology: no new imaging from 06/2017 done for pelvic pain  CLINICAL DATA:  Chronic pelvic pain in female.  EXAM: TRANSABDOMINAL AND TRANSVAGINAL ULTRASOUND OF PELVIS  TECHNIQUE: Both transabdominal and transvaginal ultrasound examinations of the pelvis were performed. Transabdominal technique was performed for global imaging of the pelvis including uterus, ovaries, adnexal regions, and pelvic cul-de-sac.  It was necessary to proceed with endovaginal exam following the transabdominal exam to visualize the endometrium and ovaries.  COMPARISON:  CT on 05/09/2016  FINDINGS: Uterus  Measurements: 8.9 x 4.1 x 4.4 cm. No fibroids or other mass visualized.  Endometrium  Thickness: 6 mm.  No focal abnormality visualized.  Right ovary  Measurements: 2.5 x 1.3 x 2.0 cm. Normal appearance/no adnexal mass.  Left ovary  Measurements: 2.4 x 1.3 x 1.1 cm. Normal appearance/no adnexal mass.  Other findings  No abnormal free fluid.  IMPRESSION: Normal appearance of uterus and ovaries. No pelvic mass or other significant abnormality identified.   Electronically Signed   By: Myles RosenthalJohn  Stahl M.D.   On: 06/08/2017 13:58  Assessment: pt stable  Plan: 1. Depression, unspecified depression type - Ambulatory referral to Integrated Behavioral Health  2. Adjustment disorder with mixed anxiety and depressed mood - Ambulatory referral to Integrated Behavioral Health  3. Language barrier Interpreter used  4. Stress incontinence of urine Recommend behavioral interventions like kegel and scheduled voiding to keep bladder empty.  5. Breakthrough bleeding  on depo provera S/s began after depo provera which I told her is very common but no s/s currently. I also told her that starting any new BC method and try to give any new method 3-6 months to see how your body will do with it. She states she has the orange card and I told her I would ask financial if a BTL is covered. I also brought up with her re: paragard IUD and even a Mirena in terms of other methods that have none to little systemic hormones.   Orders Placed This Encounter  Procedures  . Ambulatory referral to Integrated Behavioral Health  . Pregnancy, urine POC    RTC PRN  Cornelia Copaharlie Ritter Helsley, Jr MD Attending [facility](Faculty Practice)

## 2019-08-28 NOTE — BH Specialist Note (Signed)
Integrated Behavioral Health via Telemedicine Video Visit  08/28/2019 [name] [id number]  Number of [place] visits: 1(2 total) Session Start time: 2:16  Session End time: 2:31 Total time: 15  Referring Provider: Aletha Halim, MD Type of Visit: Video Patient/Family location: Home [facility] Provider location: [facility] All persons participating in visit: Patient Amber Travis and [place]    Confirmed patient's address: Yes  Confirmed patient's phone number: Yes  Any changes to demographics: No   Confirmed patient's insurance: Yes  Any changes to patient's insurance: No   Discussed confidentiality: Yes   I connected with Amber Travis  by a video enabled telemedicine application and verified that I am speaking with the correct person using two identifiers.     I discussed the limitations of evaluation and management by telemedicine and the availability of in person appointments.  I discussed that the purpose of this visit is to provide behavioral health care while limiting exposure to the novel coronavirus.   Discussed there is a possibility of technology failure and discussed alternative modes of communication if that failure occurs.  I discussed that engaging in this video visit, they consent to the provision of behavioral healthcare and the services will be billed under their insurance.  Patient and/or legal guardian expressed understanding and consented to video visit: Yes   PRESENTING CONCERNS: Patient and/or family reports the following symptoms/concerns: Pt states her primary concern today is frustration over not having approval yet for "surgery to not have babies" due to being uninsured. Pt also does not know why she has an appointment with neurology. Pt's goal is to gain approval for surgery, and attributes all symptoms of anxiety/depression to not having approval yet.  Duration of problem: Undetermined;  Severity of problem: moderate  STRENGTHS (Protective Factors/Coping Skills): Strong family support  GOALS ADDRESSED: Patient will: 1.  Reduce symptoms of: stress  2.  Increase knowledge and/or ability of: stress reduction  3.  Demonstrate ability to: Increase healthy adjustment to current life circumstances  INTERVENTIONS: Interventions utilized:  Supportive Counseling Standardized Assessments completed: Not Needed  ASSESSMENT: Patient currently experiencing Uninsured and Language barrier   Patient may benefit from discussion with OB regarding permanent birth control options .  PLAN: 1. Follow up with behavioral health clinician on : As needed 2. Behavioral recommendations:  -Bring all questions about surgery and non-surgical options for birth control to medical provider on 09/14/19 -Call [facility]rology (referred for tension headaches) as needed to cancel or reschedule appointment 3. Referral(s): [place] (In Clinic)  I discussed the assessment and treatment plan with the patient and/or parent/guardian. They were provided an opportunity to ask questions and all were answered. They agreed with the plan and demonstrated an understanding of the instructions.   They were advised to call back or seek an in-person evaluation if the symptoms worsen or if the condition fails to improve as anticipated.  Amber Travis Amber Travis  Depression screen [facility] 2/9 08/27/2019 06/12/2019 03/06/2019 01/31/2019 10/17/2018  Decreased Interest 1 0 0 1 1  Down, Depressed, Hopeless 1 0 0 3 0  PHQ - 2 Score 2 0 0 4 1  Altered sleeping 3 0 0 3 2  Tired, decreased energy 1 0 3 3 1   Change in appetite 3 0 0 2 3  Feeling bad or failure about yourself  1 0 0 0 1  Trouble concentrating 1 0 0 0 1  Moving slowly or fidgety/restless 1 0 0  0 1  Suicidal thoughts 0 0 0 0 0  PHQ-9 Score 12 0 3 12 10   Some recent data might be hidden   GAD 7 : Generalized Anxiety Score 08/27/2019 06/12/2019  03/06/2019 01/31/2019  Nervous, Anxious, on Edge 1 0 0 3  Control/stop worrying 3 0 0 3  Worry too much - different things 3 0 2 3  Trouble relaxing 3 0 2 3  Restless 2 0 0 3  Easily annoyed or irritable 1 0 2 0  Afraid - awful might happen 3 0 0 1  Total GAD 7 Score 16 0 6 16

## 2019-08-29 ENCOUNTER — Ambulatory Visit: Payer: Self-pay

## 2019-09-02 ENCOUNTER — Encounter: Payer: Self-pay | Admitting: Nurse Practitioner

## 2019-09-03 ENCOUNTER — Encounter: Payer: Self-pay | Admitting: Obstetrics and Gynecology

## 2019-09-03 DIAGNOSIS — Z789 Other specified health status: Secondary | ICD-10-CM | POA: Insufficient documentation

## 2019-09-03 DIAGNOSIS — N921 Excessive and frequent menstruation with irregular cycle: Secondary | ICD-10-CM | POA: Insufficient documentation

## 2019-09-04 ENCOUNTER — Telehealth: Payer: Self-pay | Admitting: Nurse Practitioner

## 2019-09-04 NOTE — Telephone Encounter (Signed)
Patient called saying that her Gynecologist told her she might have problems with her bladder and patient would like to know what she needs to do. Patient does not know if she needs to see the Gynecologist for her bladder. Please f/u

## 2019-09-06 NOTE — Telephone Encounter (Signed)
Will route to PCP 

## 2019-09-09 NOTE — Telephone Encounter (Signed)
She does not need to see a urologist.   It was recommended that she practice the following. Which I have already instructed her as well previoulsy.  4. Stress incontinence of urine Recommend behavioral interventions like kegel and scheduled voiding to keep bladder empty.

## 2019-09-11 ENCOUNTER — Ambulatory Visit (INDEPENDENT_AMBULATORY_CARE_PROVIDER_SITE_OTHER): Payer: Self-pay | Admitting: Clinical

## 2019-09-11 DIAGNOSIS — Z5989 Other problems related to housing and economic circumstances: Secondary | ICD-10-CM

## 2019-09-11 DIAGNOSIS — Z789 Other specified health status: Secondary | ICD-10-CM

## 2019-09-11 DIAGNOSIS — Z598 Other problems related to housing and economic circumstances: Secondary | ICD-10-CM

## 2019-09-14 ENCOUNTER — Other Ambulatory Visit: Payer: Self-pay

## 2019-09-14 ENCOUNTER — Telehealth: Payer: Self-pay | Admitting: Nurse Practitioner

## 2019-09-14 ENCOUNTER — Encounter: Payer: Self-pay | Admitting: Obstetrics and Gynecology

## 2019-09-14 ENCOUNTER — Ambulatory Visit (INDEPENDENT_AMBULATORY_CARE_PROVIDER_SITE_OTHER): Payer: Self-pay | Admitting: Obstetrics and Gynecology

## 2019-09-14 VITALS — BP 106/54 | HR 68 | Wt 162.1 lb

## 2019-09-14 DIAGNOSIS — R5383 Other fatigue: Secondary | ICD-10-CM

## 2019-09-14 DIAGNOSIS — Z789 Other specified health status: Secondary | ICD-10-CM

## 2019-09-14 DIAGNOSIS — R300 Dysuria: Secondary | ICD-10-CM

## 2019-09-14 DIAGNOSIS — Z3009 Encounter for other general counseling and advice on contraception: Secondary | ICD-10-CM

## 2019-09-14 DIAGNOSIS — Z113 Encounter for screening for infections with a predominantly sexual mode of transmission: Secondary | ICD-10-CM

## 2019-09-14 DIAGNOSIS — N898 Other specified noninflammatory disorders of vagina: Secondary | ICD-10-CM

## 2019-09-14 DIAGNOSIS — N393 Stress incontinence (female) (male): Secondary | ICD-10-CM

## 2019-09-14 DIAGNOSIS — R1030 Lower abdominal pain, unspecified: Secondary | ICD-10-CM

## 2019-09-14 DIAGNOSIS — N949 Unspecified condition associated with female genital organs and menstrual cycle: Secondary | ICD-10-CM

## 2019-09-14 LAB — POCT URINALYSIS DIP (DEVICE)
Bilirubin Urine: NEGATIVE
Glucose, UA: NEGATIVE mg/dL
Ketones, ur: NEGATIVE mg/dL
Leukocytes,Ua: NEGATIVE
Nitrite: NEGATIVE
Protein, ur: NEGATIVE mg/dL
Specific Gravity, Urine: 1.025 (ref 1.005–1.030)
Urobilinogen, UA: 0.2 mg/dL (ref 0.0–1.0)
pH: 5.5 (ref 5.0–8.0)

## 2019-09-14 LAB — POCT PREGNANCY, URINE: Preg Test, Ur: NEGATIVE

## 2019-09-14 MED ORDER — DOXYCYCLINE HYCLATE 100 MG PO CAPS: 100.0000 mg | ORAL_CAPSULE | Freq: Two times a day (BID) | ORAL | 0 refills | Status: DC

## 2019-09-14 MED ORDER — METRONIDAZOLE 500 MG PO TABS: 500.0000 mg | ORAL_TABLET | Freq: Two times a day (BID) | ORAL | 0 refills | Status: DC

## 2019-09-14 MED ORDER — CEFTRIAXONE SODIUM 250 MG IJ SOLR
250.0000 mg | Freq: Once | INTRAMUSCULAR | Status: AC
  Administered 2019-09-14: 11:00:00 250 mg via INTRAMUSCULAR

## 2019-09-14 MED FILL — ?METRONIDAZOLE 500 MG TABS: 500 | 7 days supply | Qty: 14 | Fill #0

## 2019-09-14 MED FILL — ?DOXYCYCLINE HYCLATE 100MG: 100 | 7 days supply | Qty: 14 | Fill #0

## 2019-09-14 NOTE — Patient Instructions (Signed)
Your iron levels and thyroid levels are normal Contact your primary doctor regarding your tiredness

## 2019-09-14 NOTE — Telephone Encounter (Signed)
Patient came to the office requesting a cafa letter  . Please, mail to her  Thank you .

## 2019-09-14 NOTE — Progress Notes (Signed)
Right side pain  Taking tylenol for pain

## 2019-09-14 NOTE — Progress Notes (Signed)
Obstetrics and Gynecology Visit Return Patient Evaluation  Appointment Date: 09/14/2019  Primary Care Provider: Claiborne Rigg  [facility] Clinic: [facility]  Chief Complaint: follow up AUB, contraception  History of Present Illness:  Amber Travis is a 39 y.o. here for follow up. Patient initially seen by me via PCP referral on 08/27/2019.  At that time I dx the cause of her AUB as BTB due to just starting depo provera.  I told her that that is common and to try and continue with it for another injection as AUB when starting a new method is common.   Interval History: Amber Travis states that she has pain with voiding  GYN-she states that I saw a vaginal lump at her last visit and told her that was related to vaginal deliveries. She states that she was due for another depo provera shot but didn't get it b/c she doesn't want to continue with it. She states she is still have SUI s/s and would like a referral  She states that she has lower abdominal discomfort  She states that she has lethargy and would like this investigated  No Vaginal bleeding (AUB resolved), vaginal discharge, smell to urine. No fevers but she states she had some chills at home.   Review of Systems:  Pertinent items noted in HPI and remainder of comprehensive ROS otherwise negative.    Patient Active Problem List   Diagnosis Date Noted  . Stress incontinence of urine 09/14/2019  . Language barrier 09/03/2019  . Breakthrough bleeding on depo provera 09/03/2019  . Morbid obesity with BMI of 40.0-44.9, adult (HCC) 03/06/2019  . S/P carpal tunnel release 08/10/2018  . Right carpal tunnel syndrome 06/28/2018  . Pelvic pain 06/13/2017  . 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 Breast lump in female 02/04/2015  . DENTAL CARIES 10/07/2010  . HYPERBILIRUBINEMIA 07/09/2010  . SKIN RASH 05/14/2010  . ANXIETY DEPRESSION 05/04/2010  . FIBROCYSTIC BREAST DISEASE 05/04/2010   . UNSPECIFIED ADJUSTMENT REACTION 04/17/2009  . MIGRAINE HEADACHE 01/27/2009  . Depression 06/11/2008   Medications:  Armida Sans had no medications administered during this visit. Current Outpatient Medications  Medication Sig Dispense Refill  . doxycycline (VIBRAMYCIN) 100 MG capsule Take 1 capsule (100 mg total) by mouth 2 (two) times daily. 14 capsule 0  . Erenumab-aooe (AIMOVIG) 70 MG/ML SOAJ Inject 70 mg into the skin every 30 (thirty) days. 1 pen 11  . ibuprofen (ADVIL) 600 MG tablet Take 1 tablet (600 mg total) by mouth every 8 (eight) hours as needed. Prn body aches or HA 45 tablet 0  . metroNIDAZOLE (FLAGYL) 500 MG tablet Take 1 tablet (500 mg total) by mouth 2 (two) times daily. 14 tablet 0   Current Facility-Administered Medications  Medication Dose Route Frequency Provider Last Rate Last Admin  . cefTRIAXone (ROCEPHIN) injection 250 mg  250 mg Intramuscular Once [place] Bing, MD        Allergies: has No Known Allergies.  Physical Exam:  BP (!) 106/54   Pulse 68   Wt 162 lb 1.6 oz (73.5 kg)   BMI 31.66 kg/m  Body mass index is 31.66 kg/m. General appearance: Well nourished, well developed female in no acute distress.  Back: no CVAT Abdomen: soft, nd. Mildly ttp in suprapubic area, no peritoneal s/s. Neuro/Psych:  Normal mood and affect.    Pelvic exam:  EGBUS: normal Vagina: normal Cervix: mildly ttp. Pt states that when I palpate it this I  the lump that I told her was due to vaginal deliveries.  Uterus: nttp   Radiology:  No new imaging  Labs: UPT negative POC U/A +heme (trace)  Assessment: pt stable  Plan:  1. Dysuria UCx today. Will tx for PID. STI swab sent  2. Vaginal lump Nothing felt. ?small subcm nodule in right bartholin's area but exam not concerning.   3. Encounter for other general counseling or advice on contraception BTL not covered with orange card. D/w her to consider continuing on depo provera but she would  like to do least amount of hormones possible, which is very reasonable. Financial application for mirena given. Unfortunately [facility] doesn't have one.   4. Stress incontinence of urine D/w her will check UCx. She would like referral but would be out of pocket for her since not covered.   5. Cervical motion tenderness Will rx with rocephin today. Doxy, flagyl given. Will schedule RN visit in 3-4d to call her to see how her discomfort is - Cervicovaginal ancillary only( [place]) - Urine Culture-GYN  6. Lower abdominal pain  7. Lethargy Normal cbc, tfts. Recommended to patient she d/w her pcp  Interpreter used   RTC: PRN  Durene Romans MD Attending [facility])

## 2019-09-15 LAB — URINE CULTURE

## 2019-09-17 ENCOUNTER — Telehealth: Payer: Self-pay | Admitting: *Deleted

## 2019-09-17 ENCOUNTER — Ambulatory Visit: Payer: Self-pay

## 2019-09-17 LAB — CERVICOVAGINAL ANCILLARY ONLY
Bacterial Vaginitis (gardnerella): NEGATIVE
Candida Glabrata: NEGATIVE
Candida Vaginitis: NEGATIVE
Chlamydia: NEGATIVE
Comment: NEGATIVE
Comment: NEGATIVE
Comment: NEGATIVE
Comment: NEGATIVE
Comment: NEGATIVE
Comment: NORMAL
Neisseria Gonorrhea: NEGATIVE
Trichomonas: NEGATIVE

## 2019-09-17 NOTE — Telephone Encounter (Signed)
TC to pt using Grecia Nandin [facility] spanish interpreter to ck on pt regarding PID tx. Pt states she is having some dizziness at times. Reminded to stay well hydrated. States she is not having any pain. Some "burning" around vagina but states it is not painful. To complete antibiotics as prescribed and make appt if vaginal burning continues and becomes painful. RN called [facility] cytology and states cervicovaginal results will be resulted by end of today.

## 2019-09-18 NOTE — Telephone Encounter (Signed)
Pt was called and informed that she will be sent a copy of the CAFA letter by mail

## 2019-09-19 NOTE — Progress Notes (Signed)
Virtual Visit via Video Note The purpose of this virtual visit is to provide medical care while limiting exposure to the novel coronavirus.    Consent was obtained for video visit:  Yes.   Answered questions that patient had about telehealth interaction:  Yes.   I discussed the limitations, risks, security and privacy concerns of performing an evaluation and management service by telemedicine. I also discussed with the patient that there may be a patient responsible charge related to this service. The patient expressed understanding and agreed to proceed.  Pt location: Home Physician Location: office Name of referring provider:  Claiborne Rigg, NP I connected with Armida Sans at patients initiation/request on 09/21/2019 at 10:50 AM EST by video enabled telemedicine application and verified that I am speaking with the correct person using two identifiers. Pt MRN:  [id number] Pt DOB:  13-Jun-1981 Video Participants:  Armida Sans   History of Present Illness:  Amber Travis Ardyth Harps is a 39 year old Spanish-speaking woman with anxiety who follows up for migraines. Communication aided by interpreter Byrd Hesselbach, [id number].  UPDATE: She was started on venlafaxine ER and rizatriptan.  She never picked up the venlafaxine because her pharmacy [facility]) did not supply it.  Intensity:  mild Duration:  40 minutes  Frequency:  She has a very mild headache daily.  She has not had a severe headache in over a month Frequency of abortive medication: on average 3 to 4 days a week.  Rescue Therapy:  2 ibuprofen and a Coke Current NSAIDS:none Current analgesics:none Current triptans:Maxalt 10mg  Current ergotamine:none Current anti-emetic:none Current muscle relaxants:none Current anti-anxiolytic:none Current sleep aide:none Current Antihypertensive medications:none Current Antidepressant medications:venlafaxine XR 37.5mg  Current  Anticonvulsant medications:none Current anti-CGRP:none Current Vitamins/Herbal/Supplements:none Current Antihistamines/Decongestants:Flonase Other therapy:none Hormone/birth control:Desogestrel/ethinyl estradiol,on itsince around January (after headaches increased)  Caffeine:1 cup of coffee daily Depression/Anxiety:Anxiety Sleep hygiene:Poor due to worrying  HISTORY: Onset:  In her 47s. They have been frequent since December 2019. She reports increased worrying that probably has increased her headaches. She subsequently was seen in the ED on 12/03/18 for headache and received a headache cocktail of toradol 15mg , Compazine 10mg , and Benadryl 25mg .It was ineffective Location:Bilateral temples and across forehead Quality:throbbing Initial intensity:severe.Shedenies new headache, thunderclap headache Aura:no Associated symptoms:Photophobia, phonophobia, nausea, vomiting. Blurred vision.Shedenies associated unilateral numbness or weakness. Initial Duration:3 days Initial Frequency:6 days a week Triggers:  Emotional stress Relieving factors:Rubbing alcohol and Vick's vapor rub on head and tie scarf around her head Activity:aggravates  Past NSAIDS:naproxen Past analgesics:tramadol, Fioricet Past abortive triptans:sumatriptan 100mg  (dizziness) Past abortive ergotamine:none Past muscle relaxants:Robaxin Past anti-emetic:Zofran 8mg  Past antihypertensive medications:none Past antidepressant medications:Sertraline 200mg , nortriptyline 25mg  (insomnia, aggravated depression) Past anticonvulsant medications:topiramate50mg  Past anti-CGRP:none Other past therapies:none  Family history of headache:Grandmother (migraines)  Past Medical History: Past Medical History:  Diagnosis Date  . Carpal tunnel syndrome, right 06/2018  . Depression     Medications: Outpatient Encounter Medications as of 09/21/2019  Medication  Sig  . doxycycline (VIBRAMYCIN) 100 MG capsule Take 1 capsule (100 mg total) by mouth 2 (two) times daily.  (AIMOVIG) 70 MG/ML SOAJ Inject 70 mg into the skin every 30 (thirty) days.  ibuprofen (ADVIL) 600 MG tablet Take 1 tablet (600 mg total) by mouth every 8 (eight) hours as needed. Prn body aches or HA  . metroNIDAZOLE (FLAGYL) 500 MG tablet Take 1 tablet (500 mg total) by mouth 2 (two) times daily.  . [DISCONTINUED] ISIBLOOM 0.15-30 MG-MCG tablet TAKE 1  TABLET BY MOUTH EVERY DAY   No facility-administered encounter medications on file as of 09/21/2019.    Allergies: No Known Allergies  Family History: Family History  Problem Relation Age of Onset  . Diabetes Mother     Social History: Social History   Socioeconomic History  . Marital status: Single    Spouse name: Not on file  . Number of children: 4  . Years of education: Not on file  . Highest education level: 9th grade  Occupational History  . Not on file  Tobacco Use  . Smoking status: Never Smoker  . Smokeless tobacco: Never Used  Substance and Sexual Activity  . Alcohol use: No  . Drug use: No  . Sexual activity: Yes    Birth control/protection: Pill  Other Topics Concern  . Not on file  Social History Narrative   Patient is right-handed. She lives with her children in a one level home. She drinks one cup of coffee a day. She has not been exercising lately. Speaks spanish .   Social Determinants of Health   Financial Resource Strain:   . Difficulty of Paying Living Expenses: Not on file  Food Insecurity:   . Worried About Charity fundraiser in the Last Year: Not on file  . Ran Out of Food in the Last Year: Not on file  Transportation Needs: No Transportation Needs  . Lack of Transportation (Medical): No  . Lack of Transportation (Non-Medical): No  Physical Activity:   . Days of Exercise per Week: Not on file  . Minutes of Exercise per Session: Not on file  Stress:   . Feeling of  Stress : Not on file  Social Connections:   . Frequency of Communication with Friends and Family: Not on file  . Frequency of Social Gatherings with Friends and Family: Not on file  . Attends Religious Services: Not on file  . Active Member of Clubs or Organizations: Not on file  . Attends Archivist Meetings: Not on file  . Marital Status: Not on file  Intimate Partner Violence:   . Fear of Current or Ex-Partner: Not on file  . Emotionally Abused: Not on file  . Physically Abused: Not on file  . Sexually Abused: Not on file    Observations/Objective:   There were no vitals taken for this visit. No acute distress.  Alert and oriented.  Speech fluent and not dysarthric.  Language intact.  Eyes orthophoric on primary gaze.  Face symmetric.  Assessment and Plan:   Migraine without aura, without status migrainosus, not intractable, suspect complicated by medication overuse.  I suspect that her pharmacy doesn't carry the ER form of venlafaxine. I will prescribe her the IR form. 1.  For preventative management, start venlafaxine IR 37.5mg  at bedtime for one week, then increase to twice daily. 2.  For abortive therapy, ibuprofen 3.  Limit use of pain relievers to no more than 2 days out of week to prevent risk of rebound or medication-overuse headache. 4.  Keep headache diary 5.  Exercise, hydration, caffeine cessation, sleep hygiene, monitor for and avoid triggers 6. Follow up 5 months.   Follow Up Instructions:    -I discussed the assessment and treatment plan with the patient. The patient was provided an opportunity to ask questions and all were answered. The patient agreed with the plan and demonstrated an understanding of the instructions.   The patient was advised to call back or seek an in-person evaluation if the  symptoms worsen or if the condition fails to improve as anticipated.   Dudley Major, DO

## 2019-09-21 ENCOUNTER — Other Ambulatory Visit: Payer: Self-pay

## 2019-09-21 ENCOUNTER — Telehealth (INDEPENDENT_AMBULATORY_CARE_PROVIDER_SITE_OTHER): Payer: Self-pay | Admitting: Neurology

## 2019-09-21 DIAGNOSIS — G43009 Migraine without aura, not intractable, without status migrainosus: Secondary | ICD-10-CM

## 2019-09-21 DIAGNOSIS — G4440 Drug-induced headache, not elsewhere classified, not intractable: Secondary | ICD-10-CM

## 2019-09-21 MED ORDER — VENLAFAXINE HCL 37.5 MG PO TABS: ORAL_TABLET | ORAL | 0 refills | Status: DC

## 2019-09-21 MED FILL — VENLAFAXINE HCL 37.5 MG TAB: 37.5 | 33 days supply | Qty: 60 | Fill #0

## 2019-10-22 ENCOUNTER — Ambulatory Visit: Payer: Self-pay

## 2020-02-18 NOTE — Progress Notes (Signed)
Patient not seen.  She did not respond to phone call or email with link for televisit.

## 2020-02-19 ENCOUNTER — Telehealth (INDEPENDENT_AMBULATORY_CARE_PROVIDER_SITE_OTHER): Payer: Self-pay | Admitting: Neurology

## 2020-02-19 ENCOUNTER — Other Ambulatory Visit: Payer: Self-pay

## 2020-02-19 DIAGNOSIS — G43009 Migraine without aura, not intractable, without status migrainosus: Secondary | ICD-10-CM

## 2020-06-23 ENCOUNTER — Ambulatory Visit: Payer: Self-pay | Attending: Family | Admitting: Family

## 2020-06-23 ENCOUNTER — Other Ambulatory Visit: Payer: Self-pay

## 2020-06-23 DIAGNOSIS — Z789 Other specified health status: Secondary | ICD-10-CM

## 2020-06-23 DIAGNOSIS — N939 Abnormal uterine and vaginal bleeding, unspecified: Secondary | ICD-10-CM

## 2020-06-23 NOTE — Progress Notes (Signed)
Virtual Visit via Telephone Note  I connected with Amber Travis, on 06/23/2020 at 10:15 AM by telephone due to the COVID-19 pandemic and verified that I am speaking with the correct person using two identifiers.  Due to current restrictions/limitations of in-office visits due to the COVID-19 pandemic, this scheduled clinical appointment was converted to a telehealth visit.   Consent: I discussed the limitations, risks, security and privacy concerns of performing an evaluation and management service by telephone and the availability of in person appointments. I also discussed with the patient that there may be a patient responsible charge related to this service. The patient expressed understanding and agreed to proceed.  Location of Patient: Home  Location of Provider: [facility]  Persons participating in Telemedicine visit: Amber Travis Amber Travis Amber Kief, NP Amber Travis, CMA [employer], Interpreter Name:Amber Travis, ID#: [id number] [employer], Interpreter Name: Amber Travis, ID#: [id number]  History of Present Illness: Amber Travis is a 39 year-old female with history of migraine headache, right carpal tunnel syndrome, anxiety depression, hyperbilirubinemia, herpes simplex type 1 antibody positive, breakthrough bleeding on Depo Provera, and stress incontinence of urine who presents for follow-up of chronic conditions.   1. PERIOD ISSUES: 07/12/2019: Encounter with nurse practitioner Meredeth Ide. Patient with abnormal uterine bleeding and referred to Gynecology.   09/14/2019: Visit with Obstetrics/Gynecology Dr. Vergie Living. Patient with dysuria and stress incontinence threfore urinalysis culture completed, treated for PID, and STI swab sent. Vaginal lump but not concerning. Counseling for contraception and financial application for Mirena given. Cervical motion tenderness and treatment with Rocephin, Doxycycline, and Flagyl and  scheduled a nurse visit. Lower abdominal pain. Lethargy with normal CBC, TFTs.  06/23/2020: Having period at least 3 times per month, light flow, and lasts 3 to 5 days. Using at least 4 pads daily. Having back and stomach pain from periods. Not using any birth control. Taking Ibuprofen which helps some. Reports at her last visit with Gynecology she was recommended a surgery to help with her symptoms, says surgery would prevent her from having any more children. Then says she was told that she was not eligible for the surgery because she is not a citizen and only has a working permit. Says she was also told that the bleeding may be related to hormone issues.  Past Medical History:  Diagnosis Date  . Carpal tunnel syndrome, right 06/2018  . Depression    No Known Allergies  Current Outpatient Medications on File Prior to Visit  Medication Sig Dispense Refill  . doxycycline (VIBRAMYCIN) 100 MG capsule Take 1 capsule (100 mg total) by mouth 2 (two) times daily. 14 capsule 0  . Erenumab-aooe (AIMOVIG) 70 MG/ML SOAJ Inject 70 mg into the skin every 30 (thirty) days. 1 pen 11  . ibuprofen (ADVIL) 600 MG tablet Take 1 tablet (600 mg total) by mouth every 8 (eight) hours as needed. Prn body aches or HA 45 tablet 0  . metroNIDAZOLE (FLAGYL) 500 MG tablet Take 1 tablet (500 mg total) by mouth 2 (two) times daily. 14 tablet 0  . venlafaxine (EFFEXOR) 37.5 MG tablet Take 1 tablet at bedtime for 1 week, then 1 tablet twice daily.  Contact office for refill. 60 tablet 0  . [DISCONTINUED] ISIBLOOM 0.15-30 MG-MCG tablet TAKE 1 TABLET BY MOUTH EVERY DAY 28 tablet 2   No current facility-administered medications on file prior to visit.    Observations/Objective: Alert and oriented x 3. Not in acute distress. Physical examination not completed  as this is a telemedicine visit.  Assessment and Plan: 1. Abnormal uterine bleeding (AUB): - Visit with primary provider on 07/12/2019 for abnormal uterine bleeding  and referred to Gynecology.  - Visit with Obstetrics/Gynceology on 09/14/2019 patient with dysuria and stress incontinence threfore urinalysis culture completed, treated for PID, and STI swab sent. Vaginal lump but not concerning. Counseling for contraception and financial application for Mirena given. Cervical motion tenderness and treatment with Rocephin, Doxycycline, and Flagyl and scheduled a nurse visit. Lower abdominal pain. Lethargy with normal CBC, TFTs. - Today concerns for having period at least 3 times per month, light flow, and lasts 3 to 5 days. Using at least 4 pads daily. Having back and stomach pain from periods. Not using any birth control. Taking Ibuprofen which helps some. Reports at her last visit with Gynecology she was recommended a surgery to help with her symptoms, says surgery would prevent her from having any more children. Then says she was told that she was not eligible for the surgery because she is not a citizen and only has a working permit. Says she was also told that the bleeding may be related to hormone issues. - Referral to Obstetrics/Gynecology for further evaluation and management.  - Follow-up with primary provider as needed. - Patient was given clear instructions to go to Emergency Department if symptoms don't improve, worsen, or new problems develop.The patient verbalized understanding. - Ambulatory referral to Obstetrics / Gynecology  2. Language barrier: Merchant navy officer participated during today's visit.  - Landscape architect, Interpreter Name:Amber Travis, ID#: [phone] - [employer], Interpreter Name: Terrial Rhodes: [id number]  Follow Up Instructions: Referral to Obstetrics/Gynecology.   Patient was given clear instructions to go to Emergency Department or return to medical center if symptoms don't improve, worsen, or new problems develop.The patient verbalized understanding.  I discussed the assessment and treatment plan with the patient. The patient  was provided an opportunity to ask questions and all were answered. The patient agreed with the plan and demonstrated an understanding of the instructions.   The patient was advised to call back or seek an in-person evaluation if the symptoms worsen or if the condition fails to improve as anticipated.   I provided 15 minutes total of non-face-to-face time during this encounter including median intraservice time, reviewing previous notes, labs, imaging, medications, management and patient verbalized understanding.    Rema Fendt, NP  [facility] [place], [place] [phone]   06/23/2020, 7:48 AM

## 2020-06-23 NOTE — Patient Instructions (Signed)
Sangrado uterino anormal Abnormal Uterine Bleeding El sangrado uterino anormal sucede cuando tiene un sangrado anormal del tero. Esto incluye lo siguiente:  Prdidas de [place] o [employer] perodos [place].  Sangrado luego de [employer]  Sangrado ms abundante que lo normal.  Perodos que duran ms de lo normal.  Sangrado luego de la menopausia. El sangrado uterino anormal puede Audiological scientist a las mujeres que estn en diversas etapas de la vida, desde adolescentes, mujeres frtiles y Probation officer, hasta mujeres que han llegado a la [place]. Las causas ms comunes de sangrado uterino anormal incluyen lo siguiente:  Psychiatrist.  Crecimiento de tejido (plipos).  Tumores no cancerosos (fibromas) en el tero.  Infeccin.  Cncer.  Desequilibrios hormonales. El mdico debe evaluar cualquier clase de sangrado anormal. Muchos casos son leves y simples de tratar, mientras que otros son ms graves. El tratamiento depender de la causa del sangrado. Siga estas indicaciones en su casa:  Controle su afeccin para ver si hay cambios.  No se haga duchas vaginales, no utilice tampones ni tenga relaciones sexuales si as se lo indic su mdico.  Cambie los apsitos con frecuencia.  Realcese los exmenes de rutina que incluyen exmenes plvicos y controles de cncer de cuello uterino.  Concurra a todas las visitas de control como se lo haya indicado el mdico. Esto es importante. Comunquese con un mdico si:  El sangrado dura ms de una semana.  Se siente mareada por momentos.  Siente nuseas o vomita. Solicite ayuda de inmediato si:  Se desmaya.  Sangrado abundante que implica cambiar el apsito cada hora.  Siente dolor abdominal.  Lance Muss.  Se siente dbil o presenta sudoracin.  Elimina cogulos de sangre grandes por la vagina. Resumen  El sangrado uterino anormal sucede cuando tiene un sangrado anormal del tero.  El mdico  debe evaluar cualquier clase de sangrado anormal. Muchos casos son leves y simples de tratar, mientras que otros son ms graves.  El tratamiento depender de la causa del sangrado. Esta informacin no tiene Theme park manager el consejo del mdico. Asegrese de hacerle al mdico cualquier pregunta que tenga. Document Revised: 03/14/2017 Document Reviewed: 03/14/2017 Elsevier Patient Education  2020 [employer].

## 2020-07-02 ENCOUNTER — Other Ambulatory Visit: Payer: Self-pay

## 2020-07-02 ENCOUNTER — Telehealth: Payer: Self-pay | Admitting: Nurse Practitioner

## 2020-07-02 ENCOUNTER — Ambulatory Visit: Payer: Self-pay | Attending: Nurse Practitioner

## 2020-07-02 NOTE — Telephone Encounter (Signed)
Pt having frequent cycles. Pt was told she'd hear from a specialist. If she didn't to check back with her PCP. Pt is checking to see next step since she still hasn't heard back from specialist.

## 2020-07-02 NOTE — Telephone Encounter (Signed)
Pt needs referral for a Mammogram since it's been about 2 yrs since she's gotten checked and she's been having pain. Please advise and thank you

## 2020-07-05 NOTE — Telephone Encounter (Signed)
I referred her to the breast clinic. Please also give her the numbers to call for the scholarship program

## 2020-07-05 NOTE — Telephone Encounter (Signed)
Can the patient call the Women's clinic to follow up on her referral? If so what is the number so Alesia Banda can give it to her? Thanks!

## 2020-07-09 NOTE — Progress Notes (Signed)
Patient not seen.  Did not answer phone or email link

## 2020-07-10 ENCOUNTER — Encounter (INDEPENDENT_AMBULATORY_CARE_PROVIDER_SITE_OTHER): Payer: Self-pay | Admitting: Neurology

## 2020-07-10 ENCOUNTER — Other Ambulatory Visit: Payer: Self-pay

## 2020-07-10 DIAGNOSIS — G43009 Migraine without aura, not intractable, without status migrainosus: Secondary | ICD-10-CM

## 2020-07-11 NOTE — Telephone Encounter (Signed)
Patient already schedule  07/24/20 @ 1:35pm  at the [facility] center

## 2020-07-15 NOTE — Telephone Encounter (Signed)
Spoke to patient. Pt. Understood and is aware. Spanish interpreter assist w/ the call.

## 2020-07-15 NOTE — Telephone Encounter (Signed)
Spoke to patient and gave the [facility] scholarship number. Spanish interpreter assist w. The call.

## 2020-07-24 ENCOUNTER — Other Ambulatory Visit: Payer: Self-pay | Admitting: Obstetrics and Gynecology

## 2020-07-24 ENCOUNTER — Ambulatory Visit (INDEPENDENT_AMBULATORY_CARE_PROVIDER_SITE_OTHER): Payer: Self-pay | Admitting: Obstetrics and Gynecology

## 2020-07-24 ENCOUNTER — Other Ambulatory Visit: Payer: Self-pay

## 2020-07-24 ENCOUNTER — Telehealth: Payer: Self-pay

## 2020-07-24 VITALS — BP 93/52 | HR 66 | Ht <= 58 in | Wt 144.3 lb

## 2020-07-24 DIAGNOSIS — N939 Abnormal uterine and vaginal bleeding, unspecified: Secondary | ICD-10-CM

## 2020-07-24 MED ORDER — MEGESTROL ACETATE 40 MG PO TABS: ORAL_TABLET | ORAL | 3 refills | Status: DC

## 2020-07-24 NOTE — Progress Notes (Signed)
Assessment and Plan: 1. Abnormal uterine bleeding (AUB): - Visit with primary provider on 07/12/2019 for abnormal uterine bleeding and referred to Gynecology.  - Visit with Obstetrics/Gynceology on 09/14/2019 patient with dysuria and stress incontinence threfore urinalysis culture completed, treated for PID, and STI swab sent. Vaginal lump but not concerning. Counseling for contraception and financial application for Mirena given. Cervical motion tenderness and treatment with Rocephin, Doxycycline, and Flagyl and scheduled a nurse visit. Lower abdominal pain. Lethargy with normal CBC, TFTs. - Today concerns for having period at least 3 times per month, light flow, and lasts 3 to 5 days. Using at least 4 pads daily. Having back and stomach pain from periods. Not using any birth control. Taking Ibuprofen which helps some. Reports at her last visit with Gynecology she was recommended a surgery to help with her symptoms, says surgery would prevent her from having any more children. Then says she was told that she was not eligible for the surgery because she is not a citizen and only has a working permit. Says she was also told that the bleeding may be related to hormone issues. - Referral to Obstetrics/Gynecology for further evaluation and management.  - Follow-up with primary provider as needed. - Patient was given clear instructions to go to Emergency Department if symptoms don't improve, worsen, or new problems develop.The patient verbalized understanding. - Ambulatory referral to Obstetrics / Gynecology

## 2020-07-24 NOTE — Telephone Encounter (Signed)
  Called pt to advise of [place] appointment on 08/07/20 at 3:45p, to arrive at 3:30p with full bladder. Pt verbalized understanding.

## 2020-07-24 NOTE — Progress Notes (Signed)
   History:  Amber Travis is a 39 y.o. [id number] who presents to clinic today for abnormal uterine bleeding.   Has been seen multiple times for similar issue.  Very tearful, says this is really impacting her life. Has had episodes or irregular bleeding on and off for several years. Was having normal cycles for most of the past year until about three months ago. Now have periods that last 10-14 days.  Goes one or two weeks in between episodes. Changes pad four or more times a day.  Last shot of depo was about a year ago.  Last child was born in 2014.  Reports intermittent hot flashes. Denies family history of early menopause, fibroids, uterine cancer.   The following portions of the patient's history were reviewed and updated as appropriate: allergies, current medications, family history, past medical history, social history, past surgical history and problem list.  Review of Systems:  Review of Systems  Constitutional: Negative for chills and fever.  Cardiovascular: Negative for chest pain.  Gastrointestinal: Negative for abdominal pain.  Musculoskeletal: Negative for back pain and myalgias.  Neurological: Negative for dizziness and headaches.      Objective:  Physical Exam BP (!) 93/52 (BP Location: Left Arm)   Pulse 66   Ht 4' (1.219 m)   Wt 144 lb 4.8 oz (65.5 kg)   BMI 44.03 kg/m  Physical Exam Vitals and nursing note reviewed.  Constitutional:      Appearance: Normal appearance.  Cardiovascular:     Rate and Rhythm: Normal rate and regular rhythm.  Pulmonary:     Effort: Pulmonary effort is normal.  Abdominal:     General: There is no distension.     Palpations: Abdomen is soft. There is no mass.     Tenderness: There is no abdominal tenderness.  Neurological:     Mental Status: She is alert.  Psychiatric:        Mood and Affect: Mood normal.        Behavior: Behavior normal.       Labs and Imaging No results found for this or any previous  visit (from the past 24 hour(s)).  No results found.   Assessment & Plan:   Abnormal Uterine Bleeding - possibly fibroid, polyp, endometrial hyperplasia. Does endorse some hot flashes, etc so could consider early menopause, but less likely. - obtain CBC, UPT - start Megace taper  - TVUS ordered  - filled out application for Liletta with assistance of in person interpreter, Eda. Will schedule for IUD insertion pending approval of application, US findings.   Gita Kudo, MD 07/24/2020  2:17 PM  Casper Harrison, MD OB Family Medicine Fellow, [facility] for [facility], [facility]

## 2020-07-25 LAB — CBC
Hematocrit: 41.5 % (ref 34.0–46.6)
Hemoglobin: 13.8 g/dL (ref 11.1–15.9)
MCH: 31.7 pg (ref 26.6–33.0)
MCHC: 33.3 g/dL (ref 31.5–35.7)
MCV: 95 fL (ref 79–97)
Platelets: 217 10*3/uL (ref 150–450)
RBC: 4.35 x10E6/uL (ref 3.77–5.28)
RDW: 11.6 % — ABNORMAL LOW (ref 11.7–15.4)
WBC: 5.4 10*3/uL (ref 3.4–10.8)

## 2020-07-25 MED FILL — MEGESTROL 40 MG TABLET: 40 | 30 days supply | Qty: 39 | Fill #0

## 2020-08-07 ENCOUNTER — Ambulatory Visit
Admission: RE | Admit: 2020-08-07 | Discharge: 2020-08-07 | Disposition: A | Discharge status: Home / Self Care | Payer: Self-pay | Source: Ambulatory Visit | Attending: Obstetrics and Gynecology | Admitting: Obstetrics and Gynecology

## 2020-08-07 ENCOUNTER — Other Ambulatory Visit: Payer: Self-pay

## 2020-08-07 DIAGNOSIS — N939 Abnormal uterine and vaginal bleeding, unspecified: Secondary | ICD-10-CM | POA: Insufficient documentation

## 2020-08-07 IMAGING — US US PELVIS COMPLETE WITH TRANSVAGINAL
1 series · 15 of 25 positions shown · non-contrast
Comparison: [DATE]

CLINICAL DATA: Abnormal uterine bleeding, irregular menses, unknown
LMP, G5P4

EXAM:
TRANSABDOMINAL AND TRANSVAGINAL ULTRASOUND OF PELVIS
TECHNIQUE: Both transabdominal and transvaginal ultrasound examinations of the
pelvis were performed. Transabdominal technique was performed for
global imaging of the pelvis including uterus, ovaries, adnexal
regions, and pelvic cul-de-sac. It was necessary to proceed with
endovaginal exam following the transabdominal exam to visualize the
endometrium and LEFT ovary.

[Series 1: us pelvis complete with transvaginal · 15 of 89 slices shown]
[im 1/89]
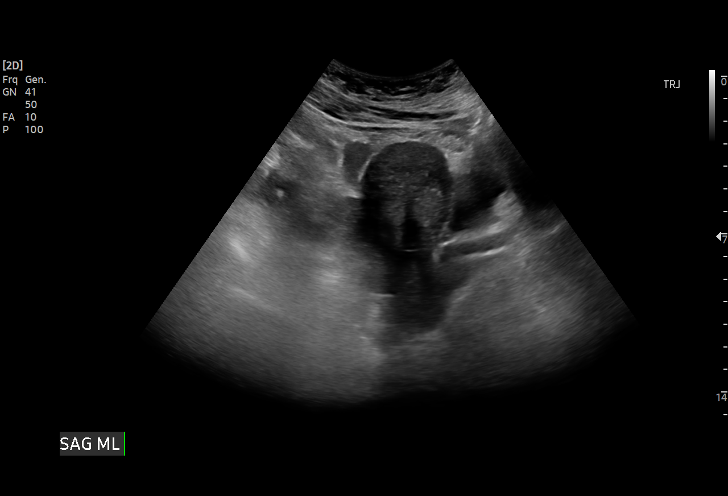
[im 8/89]
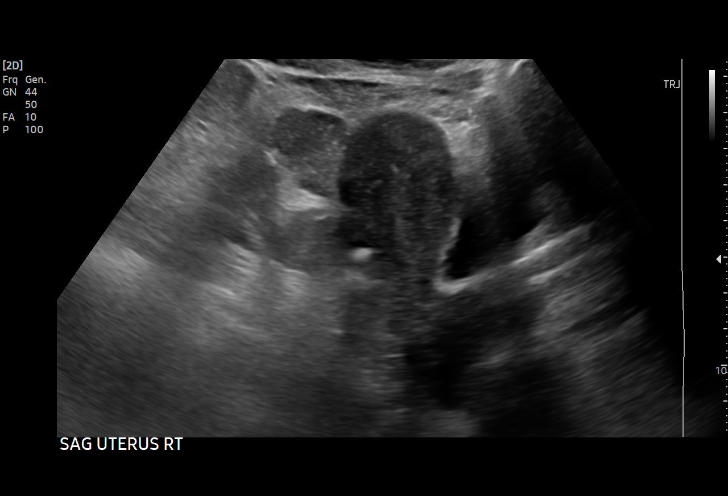
[im 15/89]
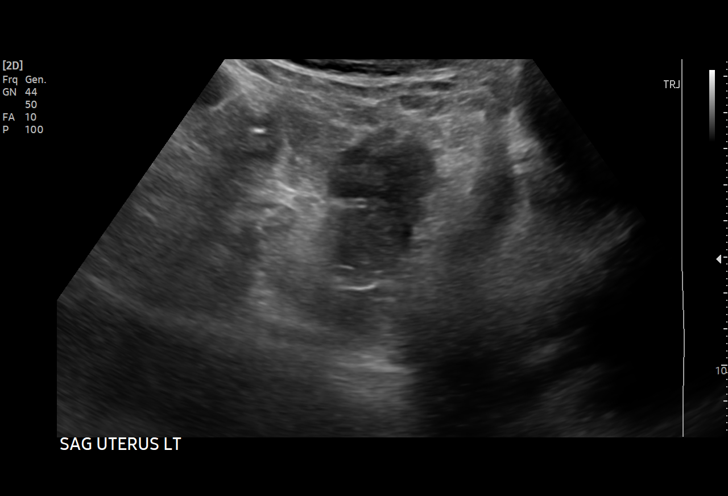
[im 19/89]
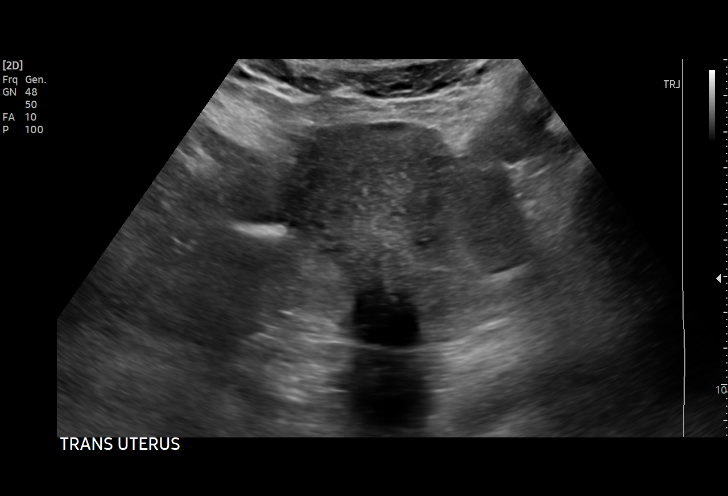
[im 26/89]
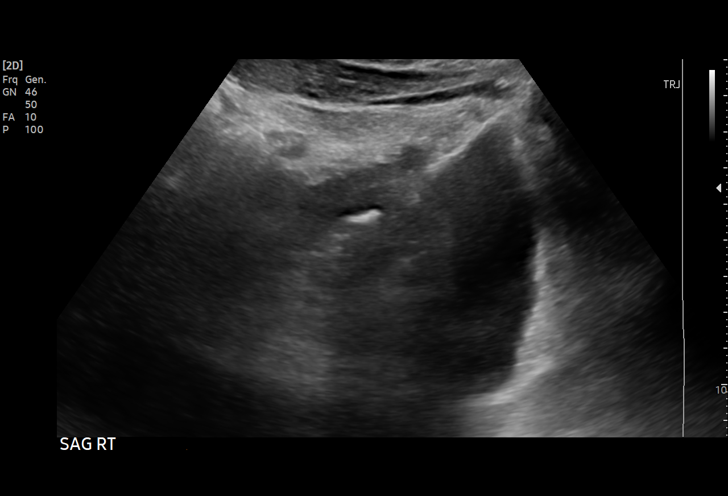
[im 34/89]
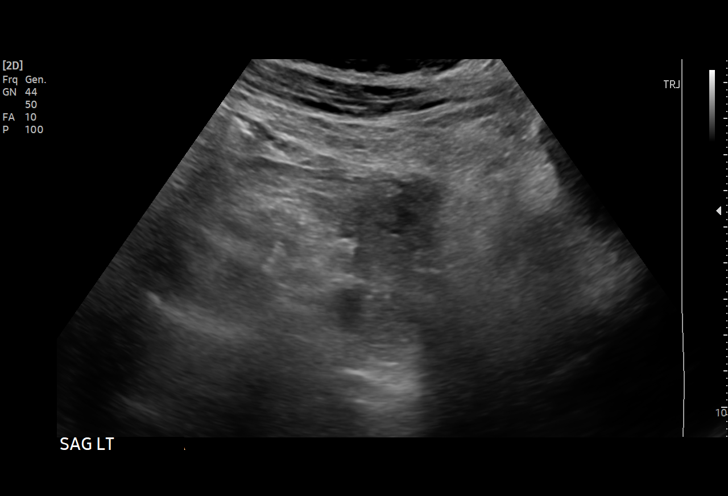
[im 37/89]
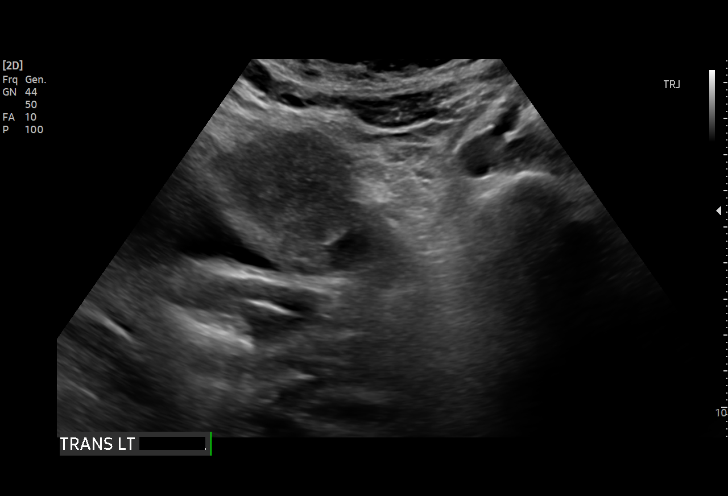
[im 45/89]
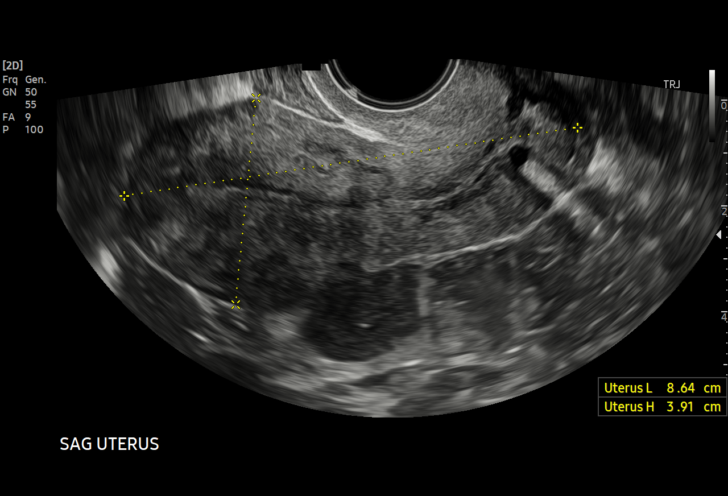
[im 52/89]
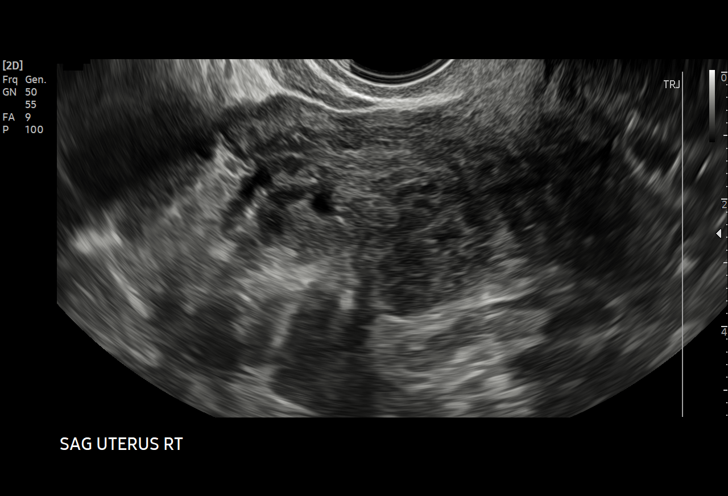
[im 56/89]
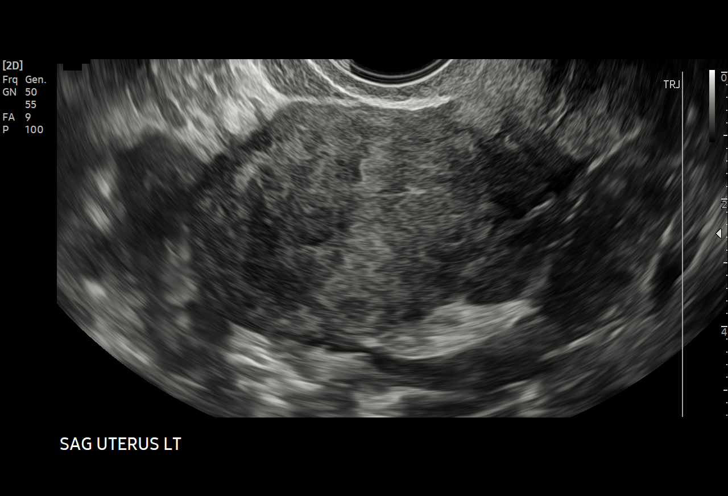
[im 63/89]
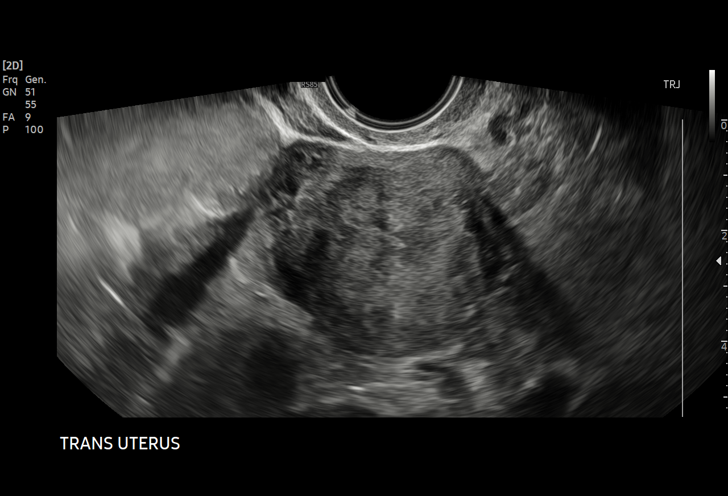
[im 70/89]
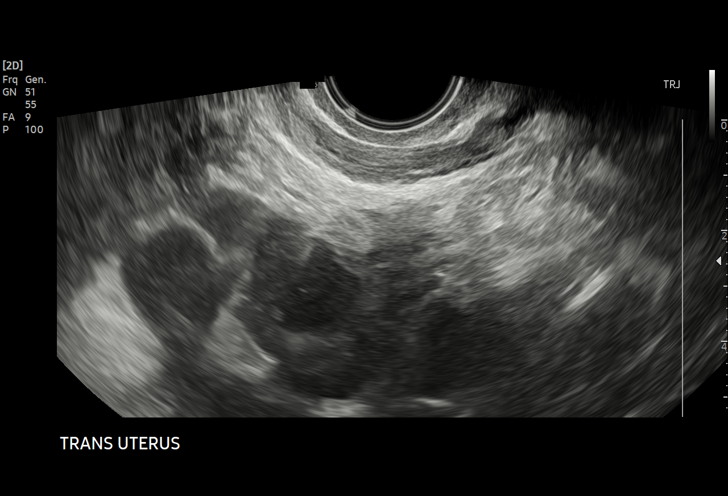
[im 74/89]
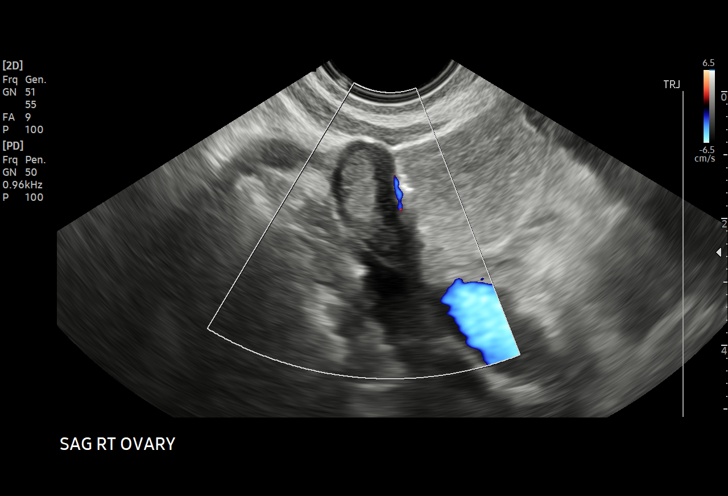
[im 81/89]
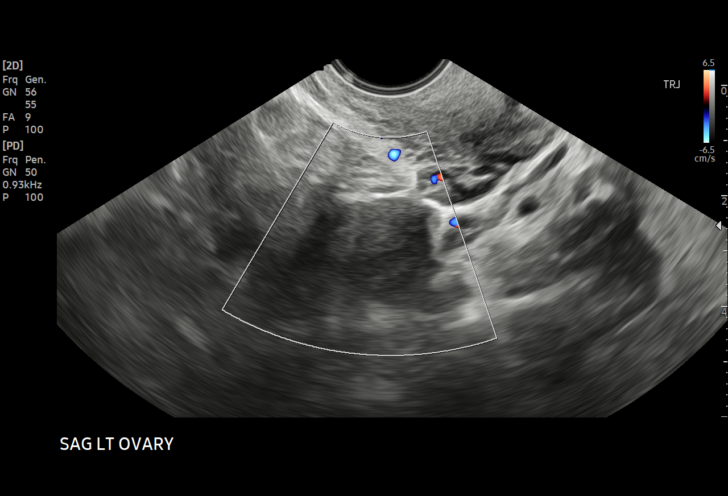
[im 89/89]
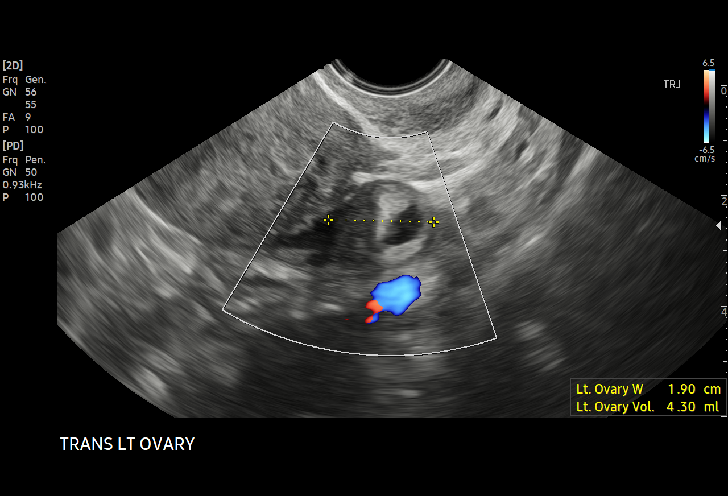

[15 of 25 positions shown; findings below may reference images not displayed]

FINDINGS: Uterus

Measurements: 8.7 x 4.1 x 4.5 cm = volume: 84 mL. Heterogeneous
myometrium. Few scattered linear areas of shadowing. Cannot exclude
adenomyosis with this appearance. No discrete mass.

Endometrium

Thickness: 2 mm.  No endometrial fluid or focal abnormality

Right ovary

Measurements: 3.4 x 1.4 x 2.2 cm = volume: 5.3 mL. Normal morphology
without mass

Left ovary

Measurements: 2.4 x 1.8 x 1.9 cm = volume: 4.3 mL. Normal morphology
without mass

Other findings

No free pelvic fluid.  No adnexal masses.
IMPRESSION: Unremarkable endometrial complex and ovaries.

Heterogeneous myometrium with scattered linear areas of shadowing,
question adenomyosis.

## 2020-08-27 ENCOUNTER — Ambulatory Visit (INDEPENDENT_AMBULATORY_CARE_PROVIDER_SITE_OTHER): Payer: Self-pay | Admitting: Obstetrics and Gynecology

## 2020-08-27 ENCOUNTER — Other Ambulatory Visit: Payer: Self-pay

## 2020-08-27 ENCOUNTER — Encounter: Payer: Self-pay | Admitting: Obstetrics and Gynecology

## 2020-08-27 VITALS — BP 139/85 | HR 80 | Ht <= 58 in | Wt 143.0 lb

## 2020-08-27 DIAGNOSIS — Z3202 Encounter for pregnancy test, result negative: Secondary | ICD-10-CM

## 2020-08-27 DIAGNOSIS — N939 Abnormal uterine and vaginal bleeding, unspecified: Secondary | ICD-10-CM

## 2020-08-27 LAB — POCT PREGNANCY, URINE: Preg Test, Ur: NEGATIVE

## 2020-08-27 MED ORDER — LEVONORGESTREL 19.5 MCG/DAY IU IUD
INTRAUTERINE_SYSTEM | Freq: Once | INTRAUTERINE | Status: AC
  Administered 2020-08-27: 12:00:00 1 via INTRAUTERINE

## 2020-08-27 NOTE — Progress Notes (Signed)
    IUD INSERTION PROCEDURE NOTE  Amber Travis is a 39 y.o. [id number] here for Liletta insertion. No GYN concerns.   She was counseled regarding the risks/benefits of IUD including insertion risk of infection, hemorrhage, damage to surrounding tissue and organs, uterine perforation. She was counseled regarding risks of IUD including implantation into uterine wall, malpositioning, misplacement out of the uterus, migration outside of uterus, possible need for hysteroscopic or laparoscopic removal, ovarian cysts, expulsion. She was advised that risk of pregnancy is low with negative UPT but is not zero and IUD insertion may cause miscarriage. Reviewed that she is also at slightly higher risk for ectopic pregnancy and she should take a pregnancy test if she believes she may be pregnant. She was advised to use backup method of protection for one week. She verbalized understanding of all of the above and consent signed.   Last intercourse was protected Last pap smear was on 03/2018 and was negative UPT today: negative  IUD Insertion  Patient identified and an adequate time out was performed. Speculum placed in the vagina. The cervix was cleaned with Betadine x 2 and grasped anteriorly with a single tooth tenaculum.  A uterine sound was used to sound the uterus to 7.5 cm;  the IUD was then placed per manufacturer's recommendations. Strings trimmed to 3 cm. Tenaculum was removed, good hemostasis noted with pressure and silver nitrate. Patient tolerated procedure well.   Patient was given post-procedure instructions.  She was reminded to have backup contraception for one week during this transition period between IUDs.  Patient was also asked to check IUD strings periodically and follow up in 4 weeks for IUD check.  Liletta IUD Exp: 08/2023 Lot: [id number]  K. Therese Sarah, M.D. Attending Center for [employer] Midwife)

## 2020-08-27 NOTE — Addendum Note (Signed)
Addended by: Kathee Delton on: 08/27/2020 11:37 AM   Modules accepted: Orders

## 2020-09-17 ENCOUNTER — Ambulatory Visit (INDEPENDENT_AMBULATORY_CARE_PROVIDER_SITE_OTHER): Payer: Self-pay | Admitting: Advanced Practice Midwife

## 2020-09-17 ENCOUNTER — Encounter: Payer: Self-pay | Admitting: Advanced Practice Midwife

## 2020-09-17 ENCOUNTER — Other Ambulatory Visit: Payer: Self-pay

## 2020-09-17 VITALS — BP 122/66 | HR 80 | Wt 148.2 lb

## 2020-09-17 DIAGNOSIS — Z30431 Encounter for routine checking of intrauterine contraceptive device: Secondary | ICD-10-CM

## 2020-09-17 NOTE — Progress Notes (Signed)
GYNECOLOGY OFFICE PROGRESS NOTE  History:  40 y.o. [id number] here today for today for IUD string check; Liletta IUD was placed  08/27/2020. No complaints about the IUD, no concerning side effects.  Patient states that x 1 week she has had abdominal pain and nausea. She denies any vomiting or fever. She denies any sick contacts as well.   Patient states that she has not had intercourse with IUD in place yet because she isn't sure if she can trust it for birth control.   The following portions of the patient's history were reviewed and updated as appropriate: allergies, current medications, past family history, past medical history, past social history, past surgical history and problem list. Last pap smear on 03/2018 was normal, negative HRHPV.  Review of Systems:  Pertinent items are noted in HPI.   Objective:  Physical Exam Blood pressure 122/66, pulse 80, weight 148 lb 3.2 oz (67.2 kg). CONSTITUTIONAL: Well-developed, well-nourished female in no acute distress.  HENT:  Normocephalic, atraumatic. External right and left ear normal. Oropharynx is clear and moist EYES: Conjunctivae and EOM are normal. Pupils are equal, round, and reactive to light. No scleral icterus.  NECK: Normal range of motion, supple, no masses CARDIOVASCULAR: Normal heart rate noted RESPIRATORY: Effort and breath sounds normal, no problems with respiration noted ABDOMEN: Soft, no distention noted.   PELVIC: Normal appearing external genitalia; normal appearing vaginal mucosa and cervix.  IUD strings visualized, about 3 cm in length outside cervix.   Assessment & Plan:  Normal IUD check. Patient to keep IUD in place for five years; can come in for removal if she desires pregnancy within the next five years. Routine preventative health maintenance measures emphasized.  Thressa Sheller 4:20 PM 09/17/20

## 2020-10-10 ENCOUNTER — Telehealth: Payer: Self-pay | Admitting: Family Medicine

## 2020-10-10 NOTE — Telephone Encounter (Signed)
Called pt with Spanish Interpreter Raquel M and confirmed with pt that she is having irregular bleeding. Pt reports that she is still have vaginal bleeding that is not heavy but she is having bleeding.  I explained to the pt that it is normal to have irregular bleeding after placement of IUD for up to six months.  I informed pt that if she starts saturating a pad an hour to please give the office a call.  Pt verbalized understanding.   Addison Naegeli, RN  10/10/20

## 2020-10-10 NOTE — Telephone Encounter (Signed)
Spanish---pt states she has been bleeding since 08-27-20 since IUD and needs to know what she should do. Pt request a call back

## 2020-10-21 ENCOUNTER — Ambulatory Visit: Payer: Self-pay | Admitting: *Deleted

## 2020-10-21 NOTE — Telephone Encounter (Signed)
Assisted by Shanda Bumps, Interpreter # [phone], pt called with complaints of "burning pain in mouth of stomach" since 10/18/20; she also has nausea and vomiting; she vomited 3 times after eating on 10/20/20; her pain is rated 4 out of 10; recommendations made per nurse triage protocol; the pt verbalized understanding and would like to be seen in the office; the pt is seen at [facility] but there is no availability in the office today; the pt can be contacted at [phone]; will route to office for final disposition.   Reason for Disposition . [1] Vomiting AND [2] abdomen looks much more swollen than usual  Answer Assessment - Initial Assessment Questions 1. LOCATION: "Where does it hurt?"      Middle of stomach 2. RADIATION: "Does the pain shoot anywhere else?" (e.g., chest, back)   Radiates to her back  3. ONSET: "When did the pain begin?" (e.g., minutes, hours or days ago)      10/18/20 4. SUDDEN: "Gradual or sudden onset?"     suddenly 5. PATTERN "Does the pain come and go, or is it constant?"    - If constant: "Is it getting better, staying the same, or worsening?"      (Note: Constant means the pain never goes away completely; most serious pain is constant and it progresses)     - If intermittent: "How long does it last?" "Do you have pain now?"     (Note: Intermittent means the pain goes away completely between bouts)    intermittent 6. SEVERITY: "How bad is the pain?"  (e.g., Scale 1-10; mild, moderate, or severe)   - MILD (1-3): doesn't interfere with normal activities, abdomen soft and not tender to touch    - MODERATE (4-7): interferes with normal activities or awakens from sleep, tender to touch    - SEVERE (8-10): excruciating pain, doubled over, unable to do any normal activities     4 out of 10 7. RECURRENT SYMPTOM: "Have you ever had this type of stomach pain before?" If Yes, ask: "When was the last time?" and "What happened that time?"      no 8. CAUSE: "What  do you think is causing the stomach pain?"    Not sure 9. RELIEVING/AGGRAVATING FACTORS: "What makes it better or worse?" (e.g., movement, antacids, bowel movement)     Eating makes worse 10. OTHER SYMPTOMS: "Has there been any vomiting, diarrhea, constipation, or urine problems?"      Vomiting x 3 on 10/20/20 11. PREGNANCY: "Is there any chance you are pregnant?" "When was your last menstrual period?" No IUD LMP 10/01/20  Protocols used: ABDOMINAL PAIN - [facility]

## 2020-10-21 NOTE — Telephone Encounter (Signed)
Using [employer] [phone] for Spanish I returned her call.  She is calling back because no one from [facility] has returned her call from this morning.   She spoke with one of our triage nurses this morning regarding this same issue.   A note was forwarded to the office for them to follow up with her.  I let her know the note was sent this morning.  I let her know someone may call later that the office closes at 5:00 and it's 3:30 now.  They make a lot of the calls in the afternoon after seeing pts.   She verbalized understanding and wanted to know if I could do or recommend something for the burning in her stomach.  I suggested Pepto Bismol or other OTC medications for her stomach may be helpful.  I also suggested she may want to go to an urgent care center for treatment due to her discomfort.     She thanked me for calling her back.  I forwarded my notes to [facility] for their disposition.  She can be reached at [phone].       Reason for Disposition . [1] MODERATE pain (e.g., interferes with normal activities) AND [2] pain comes and goes (cramps) AND [3] present > 24 hours  (Exception: pain with Vomiting or Diarrhea - see that Guideline)  Answer Assessment - Initial Assessment Questions 1. LOCATION: "Where does it hurt?"      (She talked with a triage nurse about this issue this morning).   She is c/o burning in her stomach since Saturday.   The office was supposed to call her back.   They haven't called me.    She was wanting to know if I could do something for her burning.  I recommended trying some Pepto Bismal.    2. RADIATION: "Does the pain shoot anywhere else?" (e.g., chest, back)     No burning in my stomach 3. ONSET: "When did the pain begin?" (e.g., minutes, hours or days ago)      Saturday.  Did not triage beyond here because she was triaged earlier today for the same issue and was wondering why no  one had called her yet. 4. SUDDEN: "Gradual or sudden onset?"     *No Answer* 5. PATTERN "Does the pain come and go, or is it constant?"    - If constant: "Is it getting better, staying the same, or worsening?"      (Note: Constant means the pain never goes away completely; most serious pain is constant and it progresses)     - If intermittent: "How long does it last?" "Do you have pain now?"     (Note: Intermittent means the pain goes away completely between bouts)     *No Answer* 6. SEVERITY: "How bad is the pain?"  (e.g., Scale 1-10; mild, moderate, or severe)   - MILD (1-3): doesn't interfere with normal activities, abdomen soft and not tender to touch    - MODERATE (4-7): interferes with normal activities or awakens from sleep, tender to touch    - SEVERE (8-10): excruciating pain, doubled over, unable to do any normal activities      *No Answer* 7. RECURRENT SYMPTOM: "Have you ever had this type of stomach pain before?" If Yes, ask: "When was the last time?" and "What happened that time?"      *No Answer* 8. CAUSE: "What do you think is causing the stomach  pain?"     *No Answer* 9. RELIEVING/AGGRAVATING FACTORS: "What makes it better or worse?" (e.g., movement, antacids, bowel movement)     *No Answer* 10. OTHER SYMPTOMS: "Has there been any vomiting, diarrhea, constipation, or urine problems?"       *No Answer* 11. PREGNANCY: "Is there any chance you are pregnant?" "When was your last menstrual period?"       *No Answer*  Protocols used: ABDOMINAL PAIN - [facility]

## 2020-10-22 ENCOUNTER — Telehealth: Payer: Self-pay | Admitting: Nurse Practitioner

## 2020-10-22 NOTE — Telephone Encounter (Signed)
Called patient to schedule an appointment. Patient describes that she is having burning in her stomach that comes and goes which is causing nausea and vomiting. I scheduled patient next available appointment and she asked that I route a message to the nurse asking for advice if there was anything she could take to help her pain/nausea. Please follow up.

## 2020-10-22 NOTE — Telephone Encounter (Signed)
Patient have an appt. On 11/13/20.

## 2020-10-22 NOTE — Telephone Encounter (Signed)
Patient have an upcoming appt. On 11/13/2020.

## 2020-10-23 ENCOUNTER — Telehealth: Payer: Self-pay | Admitting: Family Medicine

## 2020-10-23 NOTE — Telephone Encounter (Signed)
Spanish -Pt states she is having burning in stomach, nausea and vomiting. Pt states she has appt with [employer] [facility] but didn't know if she should come to GYN dr. Please call pt at her request

## 2020-10-23 NOTE — Telephone Encounter (Signed)
Called pt with interpreter Eda. Pt states vaginal bleeding has stopped and denies any pain in her pelvic area, nothing like menstrual cramping.   Reports nausea, vomiting, and burning sensation in her stomach for the past week. Encouraged pt to keep appt with [facility] on 3/10. Explained this does not sound GYN related. Encouraged pt to try OTC medications and to go to urgent care if she doesn't improve; options sent via MyChart. Encouraged pt to continue updating PCP.

## 2020-10-26 NOTE — Telephone Encounter (Signed)
Over the counter pepcid twice a day or pepto bismol or tums

## 2020-10-30 NOTE — Telephone Encounter (Signed)
Spoke to patient and informed on PCP advising. Patient was informed if she develop any pain she will need to be seen in urgent care or emergency department.

## 2020-11-13 ENCOUNTER — Other Ambulatory Visit: Payer: Self-pay

## 2020-11-13 ENCOUNTER — Other Ambulatory Visit: Payer: Self-pay | Admitting: Physician Assistant

## 2020-11-13 ENCOUNTER — Ambulatory Visit: Payer: Self-pay | Attending: Physician Assistant | Admitting: Physician Assistant

## 2020-11-13 DIAGNOSIS — F341 Dysthymic disorder: Secondary | ICD-10-CM

## 2020-11-13 DIAGNOSIS — K30 Functional dyspepsia: Secondary | ICD-10-CM

## 2020-11-13 DIAGNOSIS — R110 Nausea: Secondary | ICD-10-CM

## 2020-11-13 MED ORDER — ONDANSETRON HCL 4 MG PO TABS: 4.0000 mg | ORAL_TABLET | Freq: Three times a day (TID) | ORAL | 0 refills | Status: DC | PRN

## 2020-11-13 MED ORDER — OMEPRAZOLE 20 MG PO CPDR: 20.0000 mg | DELAYED_RELEASE_CAPSULE | Freq: Every day | ORAL | 3 refills | Status: DC

## 2020-11-13 MED FILL — OMEPRAZOLE 20 MG CAP: 20 | 30 days supply | Qty: 30 | Fill #0

## 2020-11-13 MED FILL — ONDANSETRON HCL 4 MG TABLET: 4 | 6 days supply | Qty: 20 | Fill #0

## 2020-11-13 NOTE — Progress Notes (Signed)
Virtual Visit via Telephone Note  I connected with Nyra Anspaugh on 11/13/20 at 10:10 AM EST by telephone and verified that I am speaking with the correct person using two identifiers.  Location: Patient: home Provider: [facility] office Mel[place] with [employer] interpreters   I discussed the limitations, risks, security and privacy concerns of performing an evaluation and management service by telephone and the availability of in person appointments. I also discussed with the patient that there may be a patient responsible charge related to this service. The patient expressed understanding and agreed to proceed.   History of Present Illness: stomach burning on and off for about 4-6 weeks.  Occasional midepigastric pain.  Some nausea and vomiting.  1-2 times per week. No fever.  No urinary s/sx.  Pain is mild to moderate.  Sharp.  Lasts 1-5 mins.  No diarrhea/constipation/melena/hematochezia  LMP last week;  Has IUD.  Depression and anxiety. She was on on medications previously.  She is trying to manage this without meds.  But, she says she is struggling a bit.  Denies SI/HI.    Observations/Objective: NAD.  A&Ox3.   Assessment and Plan: 1. Stomach burning - H. pylori breath test; Future - Comprehensive metabolic panel; Future - omeprazole (PRILOSEC) 20 MG capsule; Take 1 capsule (20 mg total) by mouth daily.  Dispense: 30 capsule; Refill: 3 - CBC with Differential/Platelet; Future  2. Nausea - omeprazole (PRILOSEC) 20 MG capsule; Take 1 capsule (20 mg total) by mouth daily.  Dispense: 30 capsule; Refill: 3 - ondansetron (ZOFRAN) 4 MG tablet; Take 1 tablet (4 mg total) by mouth every 8 (eight) hours as needed for nausea or vomiting.  Dispense: 20 tablet; Refill: 0 - CBC with Differential/Platelet; Future  3. ANXIETY DEPRESSION Can restart meds in the future, but for now she is declining meds - Ambulatory referral to Integrated Behavioral Health    Follow Up  Instructions: See PCP in 3 months;  Chronic issues   I discussed the assessment and treatment plan with the patient. The patient was provided an opportunity to ask questions and all were answered. The patient agreed with the plan and demonstrated an understanding of the instructions.   The patient was advised to call back or seek an in-person evaluation if the symptoms worsen or if the condition fails to improve as anticipated.  I provided 17 minutes of non-face-to-face time during this encounter.   Georgian Co, PA-C  Patient ID: Corena Tilson, female   DOB: May 22, 1981, 40 y.o.   MRN: [id number]

## 2020-11-14 ENCOUNTER — Telehealth: Payer: Self-pay | Admitting: Nurse Practitioner

## 2020-11-14 LAB — CBC WITH DIFFERENTIAL/PLATELET
Basophils Absolute: 0 10*3/uL (ref 0.0–0.2)
Basos: 1 %
EOS (ABSOLUTE): 0.2 10*3/uL (ref 0.0–0.4)
Eos: 2 %
Hematocrit: 40.9 % (ref 34.0–46.6)
Hemoglobin: 13.9 g/dL (ref 11.1–15.9)
Immature Grans (Abs): 0 10*3/uL (ref 0.0–0.1)
Immature Granulocytes: 0 %
Lymphocytes Absolute: 1.6 10*3/uL (ref 0.7–3.1)
Lymphs: 24 %
MCH: 32.6 pg (ref 26.6–33.0)
MCHC: 34 g/dL (ref 31.5–35.7)
MCV: 96 fL (ref 79–97)
Monocytes Absolute: 0.5 10*3/uL (ref 0.1–0.9)
Monocytes: 8 %
Neutrophils Absolute: 4.2 10*3/uL (ref 1.4–7.0)
Neutrophils: 65 %
Platelets: 237 10*3/uL (ref 150–450)
RBC: 4.27 x10E6/uL (ref 3.77–5.28)
RDW: 12.5 % (ref 11.7–15.4)
WBC: 6.5 10*3/uL (ref 3.4–10.8)

## 2020-11-14 LAB — COMPREHENSIVE METABOLIC PANEL
ALT: 18 IU/L (ref 0–32)
AST: 25 IU/L (ref 0–40)
Albumin/Globulin Ratio: 1.9 (ref 1.2–2.2)
Albumin: 4.8 g/dL (ref 3.8–4.8)
Alkaline Phosphatase: 69 IU/L (ref 44–121)
BUN/Creatinine Ratio: 14 (ref 9–23)
BUN: 12 mg/dL (ref 6–20)
Bilirubin Total: 1.4 mg/dL — ABNORMAL HIGH (ref 0.0–1.2)
CO2: 22 mmol/L (ref 20–29)
Calcium: 9.6 mg/dL (ref 8.7–10.2)
Chloride: 101 mmol/L (ref 96–106)
Creatinine, Ser: 0.85 mg/dL (ref 0.57–1.00)
Globulin, Total: 2.5 g/dL (ref 1.5–4.5)
Glucose: 79 mg/dL (ref 65–99)
Potassium: 4.4 mmol/L (ref 3.5–5.2)
Sodium: 141 mmol/L (ref 134–144)
Total Protein: 7.3 g/dL (ref 6.0–8.5)
eGFR: 89 mL/min/{1.73_m2} (ref >59)

## 2020-11-14 NOTE — Telephone Encounter (Signed)
Patient viewed most recent lab results and in need of clarity, please advise [phone].

## 2020-11-15 LAB — H. PYLORI BREATH TEST: H pylori Breath Test: POSITIVE — AB

## 2020-11-16 NOTE — Telephone Encounter (Signed)
Please forward request to [facility]. I did not order these labs. Thanks!

## 2020-11-18 ENCOUNTER — Telehealth: Payer: Self-pay | Admitting: Nurse Practitioner

## 2020-11-18 NOTE — Telephone Encounter (Signed)
Called patient using interpreter services and LVM advising patient to call [phone] to schedule a 3 month follow up appointment with Amber Travis (around 02/16/21).

## 2020-11-19 ENCOUNTER — Other Ambulatory Visit: Payer: Self-pay | Admitting: Physician Assistant

## 2020-11-19 DIAGNOSIS — R110 Nausea: Secondary | ICD-10-CM

## 2020-11-19 DIAGNOSIS — K30 Functional dyspepsia: Secondary | ICD-10-CM

## 2020-11-19 MED ORDER — OMEPRAZOLE 20 MG PO CPDR: 20.0000 mg | DELAYED_RELEASE_CAPSULE | Freq: Two times a day (BID) | ORAL | 3 refills | Status: DC

## 2020-11-19 MED ORDER — CLARITHROMYCIN 500 MG PO TABS
500.0000 mg | ORAL_TABLET | Freq: Two times a day (BID) | ORAL | 0 refills | Status: DC
Start: 2020-11-19 — End: 2020-11-19

## 2020-11-19 MED ORDER — FLUCONAZOLE 150 MG PO TABS: 150.0000 mg | ORAL_TABLET | Freq: Once | ORAL | 0 refills | Status: DC

## 2020-11-19 MED ORDER — AMOXICILLIN 500 MG PO CAPS: 1000.0000 mg | ORAL_CAPSULE | Freq: Two times a day (BID) | ORAL | 0 refills | Status: DC

## 2020-12-08 ENCOUNTER — Telehealth: Payer: Self-pay | Admitting: Nurse Practitioner

## 2020-12-08 NOTE — Telephone Encounter (Signed)
Pt calling this am to let the dr know the stomach "treatments" did not work and she is still having burning in her stomach.pt states she had burning all weekend. Pt states she called 3 days ago and was told someone would call her back. Please advise.  Pt declined UC or the mobile bus.  She would like call back to see what she can do next.

## 2020-12-09 NOTE — Telephone Encounter (Signed)
Will forward to provider  

## 2020-12-10 ENCOUNTER — Other Ambulatory Visit: Payer: Self-pay | Admitting: Nurse Practitioner

## 2020-12-10 DIAGNOSIS — B9681 Helicobacter pylori [H. pylori] as the cause of diseases classified elsewhere: Secondary | ICD-10-CM

## 2020-12-10 NOTE — Telephone Encounter (Signed)
Referred to GI. They will call her to schedule

## 2021-01-06 ENCOUNTER — Encounter: Payer: Self-pay | Admitting: *Deleted

## 2021-01-21 ENCOUNTER — Ambulatory Visit: Payer: Self-pay | Attending: Nurse Practitioner

## 2021-01-21 ENCOUNTER — Other Ambulatory Visit: Payer: Self-pay

## 2021-02-17 ENCOUNTER — Ambulatory Visit: Payer: Self-pay | Admitting: Nurse Practitioner

## 2021-04-22 ENCOUNTER — Other Ambulatory Visit: Payer: Self-pay

## 2021-04-22 ENCOUNTER — Ambulatory Visit: Payer: Self-pay | Attending: Physician Assistant | Admitting: Physician Assistant

## 2021-04-22 ENCOUNTER — Encounter: Payer: Self-pay | Admitting: Physician Assistant

## 2021-04-22 VITALS — BP 125/80 | HR 89 | Temp 98.2°F | Ht <= 58 in | Wt 150.2 lb

## 2021-04-22 DIAGNOSIS — M25522 Pain in left elbow: Secondary | ICD-10-CM

## 2021-04-22 DIAGNOSIS — M25521 Pain in right elbow: Secondary | ICD-10-CM

## 2021-04-22 DIAGNOSIS — Z789 Other specified health status: Secondary | ICD-10-CM

## 2021-04-22 DIAGNOSIS — M62838 Other muscle spasm: Secondary | ICD-10-CM

## 2021-04-22 MED ORDER — MELOXICAM 15 MG PO TABS
15.0000 mg | ORAL_TABLET | Freq: Every day | ORAL | 0 refills | Status: DC
  Filled 2021-04-22: qty 30, 30d supply, fill #0

## 2021-04-22 MED ORDER — METHOCARBAMOL 500 MG PO TABS
1000.0000 mg | ORAL_TABLET | Freq: Three times a day (TID) | ORAL | 0 refills | Status: DC | PRN
  Filled 2021-04-22: qty 90, 15d supply, fill #0

## 2021-04-22 NOTE — Progress Notes (Signed)
Patient ID: Amber Travis, female   DOB: 17-May-1981, 40 y.o.   MRN: [id number]        Amber Travis, is a 40 y.o. female  UTM:[id number]  FKC:[id number]  DOB - 1980/12/26  Chief Complaint  Patient presents with   Pain    Bilateral elbows       Subjective:   Amber Travis is a 40 y.o. female here today for a L elbow pain that started a few months ago that now radiates Pain in L neck, shoulder and elbow.  Also pain in R elbow.  Had an injection in a private clinic about 1 month ago in L elbow.  It has not helped.  No numbness/weakness.  She has not taken OTC.  She is R hand dominant  NKI.  No weakness.  . Patient has No headache, No chest pain, No abdominal pain - No Nausea, No new weakness tingling or numbness, No Cough - SOB.  No problems updated.  ALLERGIES: No Known Allergies  PAST MEDICAL HISTORY: Past Medical History:  Diagnosis Date   Carpal tunnel syndrome, right 06/2018   Depression     MEDICATIONS AT HOME: Prior to Admission medications   Medication Sig Start Date End Date Taking? Authorizing Provider  meloxicam (MOBIC) 15 MG tablet Take 1 tablet (15 mg total) by mouth daily. Prn pain 04/22/21  Yes Zakiyah Diop M, PA-C  methocarbamol (ROBAXIN) 500 MG tablet Take 2 tablets (1,000 mg total) by mouth every 8 (eight) hours as needed for muscle spasms. 04/22/21  Yes Donella Pascarella M, PA-C  omeprazole (PRILOSEC) 20 MG capsule TAKE 1 CAPSULE (20 MG TOTAL) BY MOUTH 2 (TWO) TIMES DAILY BEFORE A MEAL. X 1 MONTH THEN ONE DAILY Patient not taking: Reported on 04/22/2021 11/19/20 11/19/21  Anders Simmonds, PA-C  ISIBLOOM 0.15-30 MG-MCG tablet TAKE 1 TABLET BY MOUTH EVERY DAY 03/01/19 03/01/19  Claiborne Rigg, NP    ROS: Neg HEENT Neg resp Neg cardiac Neg GI Neg GU Neg psych Neg neuro  Objective:   Vitals:   04/22/21 1103  BP: 125/80  Pulse: 89  Temp: 98.2 F (36.8 C)  TempSrc: Oral  SpO2: 99%  Weight: 150 lb 3.2 oz (68.1 kg)   Height: 4\' 10"  (1.473 m)   Exam General appearance : Awake, alert, not in any distress. Speech Clear. Not toxic looking HEENT: Atraumatic and Normocephalic Neck: Supple, no JVD. No cervical lymphadenopathy.  Chest: Good air entry bilaterally, CTAB.  No rales/rhonchi/wheezing CVS: S1 S2 regular, no murmurs.  C-spine without tenderness but here is significant trapezius spasm on the L>R.   L and R arm and elbows examined.  She is mildly TTP at medial aspect of B elbow.  No erythema.  No swelling.  Full S&ROM Extremities: B/L Lower Ext shows no edema, both legs are warm to touch Neurology: Awake alert, and oriented X 3, CN II-XII intact, Non focal Skin: No Rash  Data Review Lab Results  Component Value Date   HGBA1C 5.2 08/21/2019   HGBA1C 5.1 04/05/2017   HGBA1C 5.0 02/20/2014    Assessment & Plan   1. Bilateral elbow joint pain - meloxicam (MOBIC) 15 MG tablet; Take 1 tablet (15 mg total) by mouth daily. Prn pain  Dispense: 30 tablet; Refill: 0 - Ambulatory referral to Orthopedic Surgery  2. Neck muscle spasm - methocarbamol (ROBAXIN) 500 MG tablet; Take 2 tablets (1,000 mg total) by mouth every 8 (eight) hours as needed for muscle spasms.  Dispense:  90 tablet; Refill: 0 - Ambulatory referral to Orthopedic Surgery  3. Language barrier [employer] "Rhunette Croft" interpreters used and additional time performing visit was required.     Patient have been counseled extensively about nutrition and exercise. Other issues discussed during this visit include: low cholesterol diet, weight control and daily exercise, foot care, annual eye examinations at Ophthalmology, importance of adherence with medications and regular follow-up. We also discussed long term complications of uncontrolled diabetes and hypertension.   Return if symptoms worsen or fail to improve.  The patient was given clear instructions to go to ER or return to medical center if symptoms don't improve, worsen or new problems  develop. The patient verbalized understanding. The patient was told to call to get lab results if they haven't heard anything in the next week.      Georgian Co, PA-C [facility] [place], [place] [phone]   04/22/2021, 11:21 AM

## 2021-05-05 ENCOUNTER — Other Ambulatory Visit: Payer: Self-pay

## 2021-05-05 ENCOUNTER — Ambulatory Visit (INDEPENDENT_AMBULATORY_CARE_PROVIDER_SITE_OTHER): Payer: Self-pay

## 2021-05-05 ENCOUNTER — Ambulatory Visit (INDEPENDENT_AMBULATORY_CARE_PROVIDER_SITE_OTHER): Payer: Self-pay | Admitting: Orthopaedic Surgery

## 2021-05-05 ENCOUNTER — Ambulatory Visit: Payer: Self-pay

## 2021-05-05 ENCOUNTER — Encounter: Payer: Self-pay | Admitting: Orthopaedic Surgery

## 2021-05-05 DIAGNOSIS — M25521 Pain in right elbow: Secondary | ICD-10-CM

## 2021-05-05 DIAGNOSIS — M25522 Pain in left elbow: Secondary | ICD-10-CM

## 2021-05-05 NOTE — Progress Notes (Signed)
Office Visit Note   Patient: Amber Travis           Date of Birth: 1980/11/27           MRN: [id number] Visit Date: 05/05/2021              Requested by: Anders Simmonds, PA-C [address] [place],  [place] [address] PCP: Claiborne Rigg, NP   Assessment & Plan: Visit Diagnoses:  1. 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  At this point, x-rays are unremarkable for structural abnormalities.  I would like to get MRIs of both elbows to evaluate for soft tissue mass.  She will follow-up with [place] once this is been completed.  Call with concerns or questions in the meantime.  Follow-Up Instructions: Return if symptoms worsen or fail to improve.   Orders:  Orders Placed This Encounter  Procedures   XR Elbow Complete Left (3+View)   XR Elbow Complete Right (3+View)   No orders of the defined types were placed in this encounter.     Procedures: No procedures performed   Clinical Data: No additional findings.   Subjective: Chief Complaint  Patient presents with   Neck - Pain   Right Elbow - Pain    HPI patient is a pleasant 40 year old Spanish-speaking right-hand-dominant female who comes in today with bilateral elbow pain for the past month.  She is here with an interpreter.  No known injury or change in activity.  The pain is constant and is located to the medial aspect of both elbows.  The pain does appear to be worse at night and when applying pressure to these areas.  She has tried over-the-counter pain medication without relief.  She notes paresthesias to the left hand.  She has a history of bilateral carpal tunnel syndrome moderate in severity but has had right carpal tunnel release in the past.  She has not previously undergone carpal tunnel release or carpal tunnel injection to the left hand.  No history of cervical spine pathology.  No neck pain.  Review of Systems as detailed in HPI.   All others reviewed and are negative.   Objective: Vital Signs: There were no vitals taken for this visit.  Physical Exam well-developed well-nourished female no acute distress.  Alert and oriented x3.  Ortho Exam examination of both elbows reveals no tenderness to the medial or lateral epicondyles.  She does have palpable and mobile nodules just proximal to the medial epicondyle on both sides which are tender.  Full range of motion of the elbows without pain.  Negative Tinel both elbows.  Positive Phalen left wrist.  Positive Tinel left wrist.  She is neurovascular intact distally.  Specialty Comments:  No specialty comments available.  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   Breast lump in female 02/04/2015   DENTAL CARIES  10/07/2010   HYPERBILIRUBINEMIA 07/09/2010   SKIN RASH 05/14/2010   ANXIETY DEPRESSION 05/04/2010   FIBROCYSTIC BREAST DISEASE 05/04/2010   UNSPECIFIED ADJUSTMENT REACTION 04/17/2009   MIGRAINE HEADACHE 01/27/2009   Depression 06/11/2008   Past Medical History:  Diagnosis Date   Carpal tunnel syndrome, right 06/2018   Depression     Family History  Problem Relation Age of Onset   Diabetes Mother     Past Surgical History:  Procedure Laterality Date   CARPAL TUNNEL RELEASE Right 06/28/2018   Procedure: RIGHT CARPAL TUNNEL RELEASE;  Surgeon: Tarry Kos, MD;  Location: [facility];  Service: Orthopedics;  Laterality: Right;   LAPAROSCOPIC APPENDECTOMY N/A 05/10/2016   Procedure: APPENDECTOMY LAPAROSCOPIC;  Surgeon: Jimmye Norman, MD;  Location: [facility];  Service: General;  Laterality: N/A;   Social History   Occupational History   Not on file  Tobacco Use   Smoking status: Never   Smokeless tobacco: Never  Vaping Use   Vaping Use: Never used  Substance and Sexual Activity   Alcohol use: No   Drug use: No   Sexual activity: Yes    Birth control/protection: Pill

## 2021-06-10 ENCOUNTER — Ambulatory Visit
Admission: RE | Admit: 2021-06-10 | Discharge: 2021-06-10 | Disposition: A | Discharge status: Home / Self Care | Payer: No Typology Code available for payment source | Source: Ambulatory Visit | Attending: Orthopaedic Surgery | Admitting: Orthopaedic Surgery

## 2021-06-10 ENCOUNTER — Ambulatory Visit
Admission: RE | Admit: 2021-06-10 | Discharge: 2021-06-10 | Disposition: A | Discharge status: Home / Self Care | Payer: Self-pay | Source: Ambulatory Visit | Attending: Orthopaedic Surgery | Admitting: Orthopaedic Surgery

## 2021-06-10 ENCOUNTER — Other Ambulatory Visit: Payer: Self-pay

## 2021-06-10 DIAGNOSIS — M25521 Pain in right elbow: Secondary | ICD-10-CM

## 2021-06-10 DIAGNOSIS — M25522 Pain in left elbow: Secondary | ICD-10-CM

## 2021-06-10 IMAGING — MR MR ELBOW*R* W/O CM
4 of 5 series · 14 of 40 positions shown · non-contrast
Comparison: Radiographs [DATE].

CLINICAL DATA: Bilateral elbow pain with palpable lumps medially
for approximately 2 months. No known injury or prior relevant
surgery.

EXAM:
MRI OF THE RIGHT ELBOW WITHOUT CONTRAST
TECHNIQUE: Multiplanar, multisequence MR imaging of the elbow was performed. No
intravenous contrast was administered.

[Series 4: T1 · axial · right · 3.0mm · 0.19mm/px · z∈[-3,+69]mm · 3 of 27 slices shown]
[im 4/27]
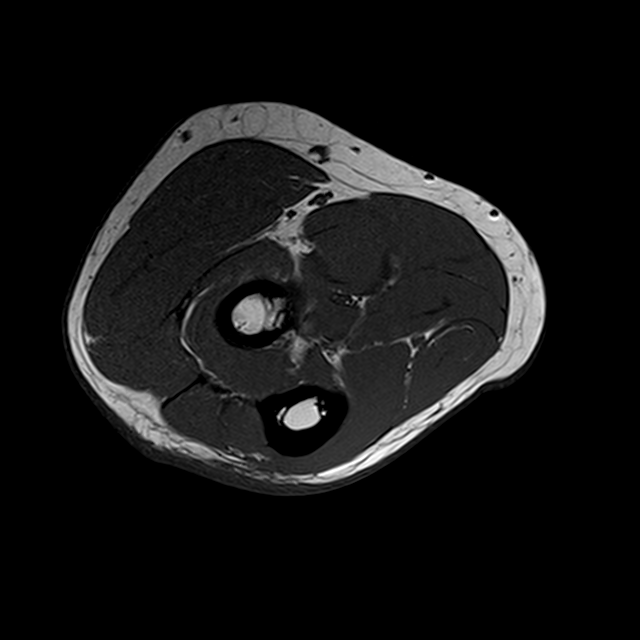
[im 14/27]
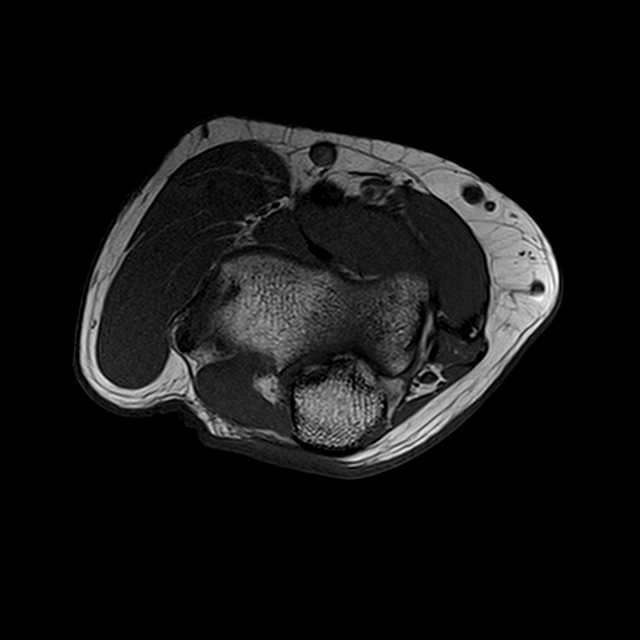
[im 23/27]
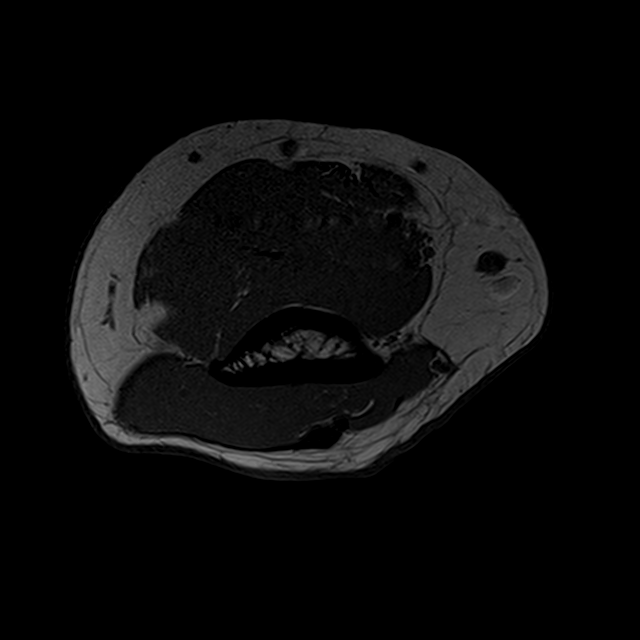

[Series 5: T2 fat-sat · axial · right · 3.0mm · 0.19mm/px · z∈[-14,+69]mm · 5 of 27 slices shown (1 of 3)]
[im 1/27]
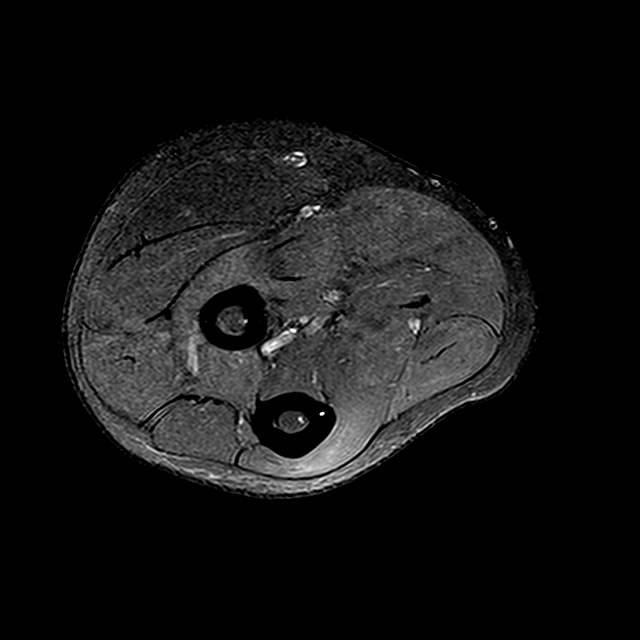
[im 4/27]
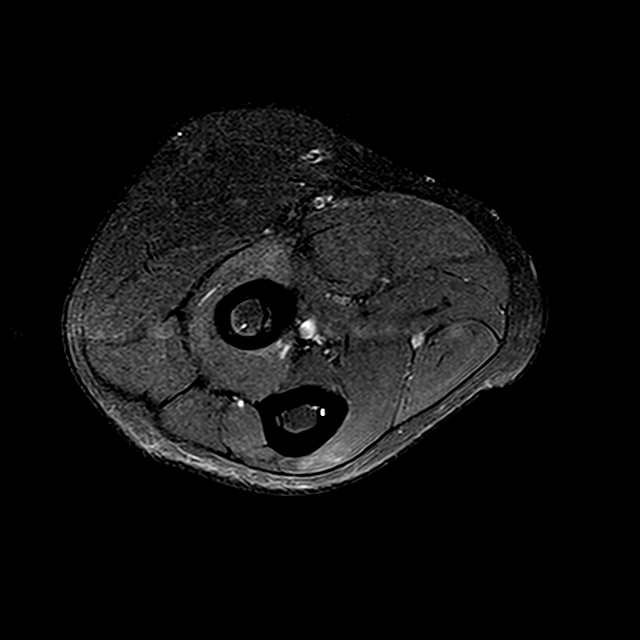
[im 7/27]
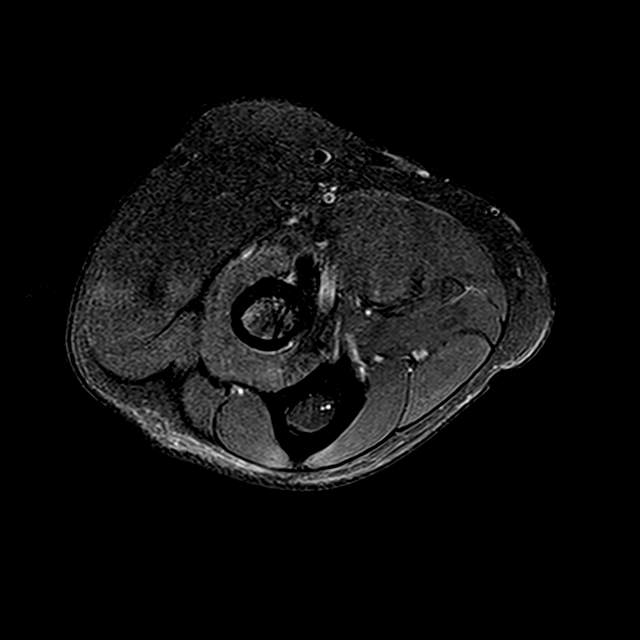
[im 14/27]
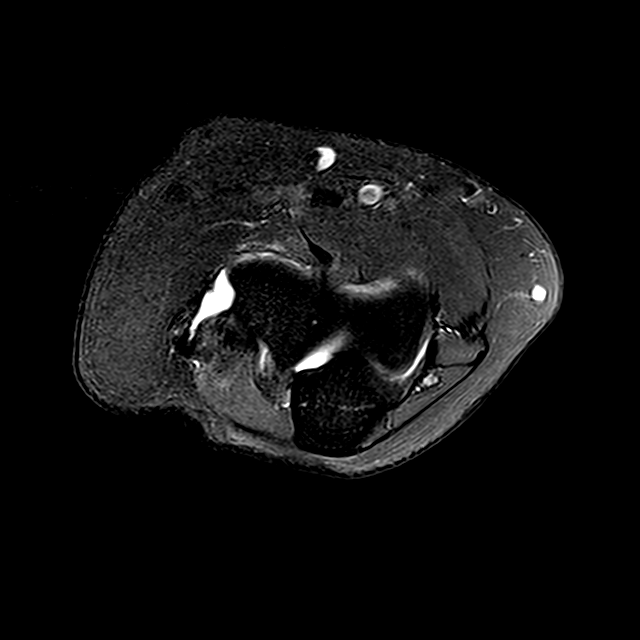
[im 23/27]
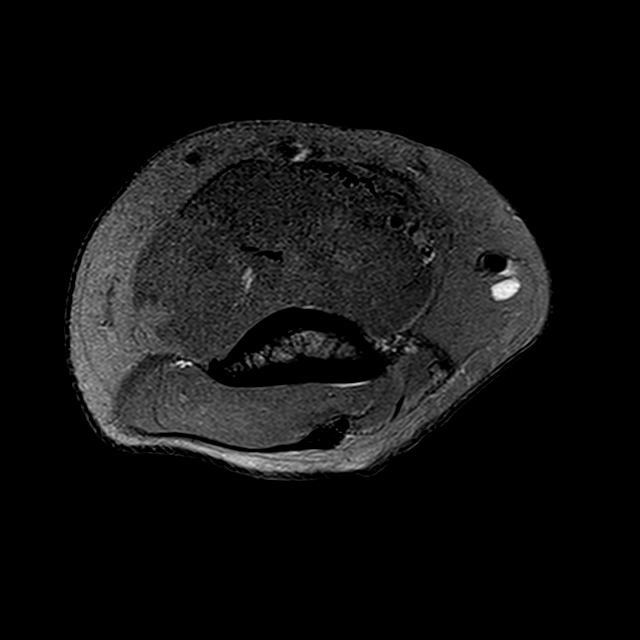

[Series 6: T2 fat-sat · coronal · right · 3.0mm · 0.22mm/px · 3 of 19 slices shown (2 of 3)]
[im 4/19]
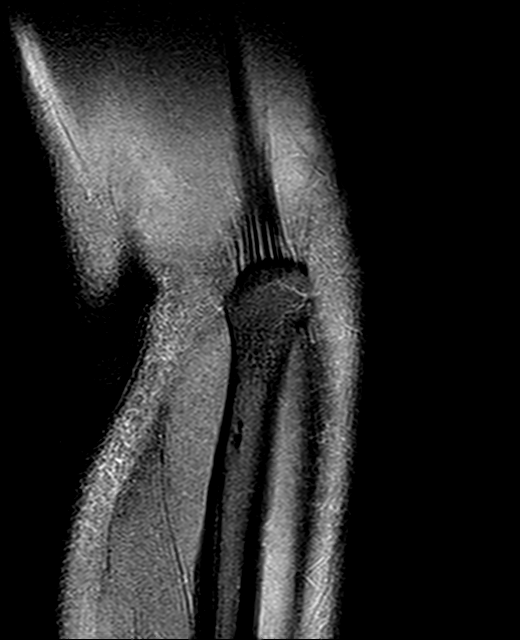
[im 10/19]
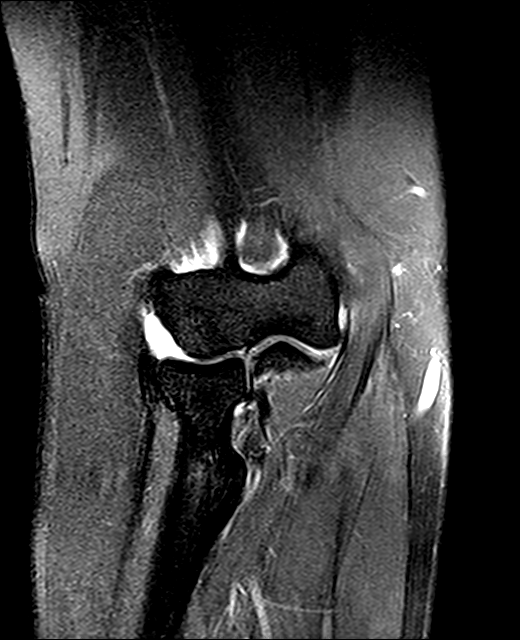
[im 16/19]
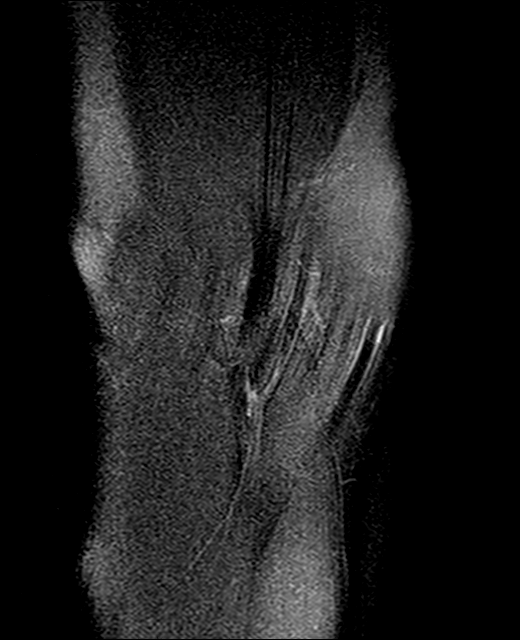

[Series 8: T2 fat-sat · oblique · right · 3.0mm · 0.22mm/px · 3 of 24 slices shown (3 of 3)]
[im 4/24]
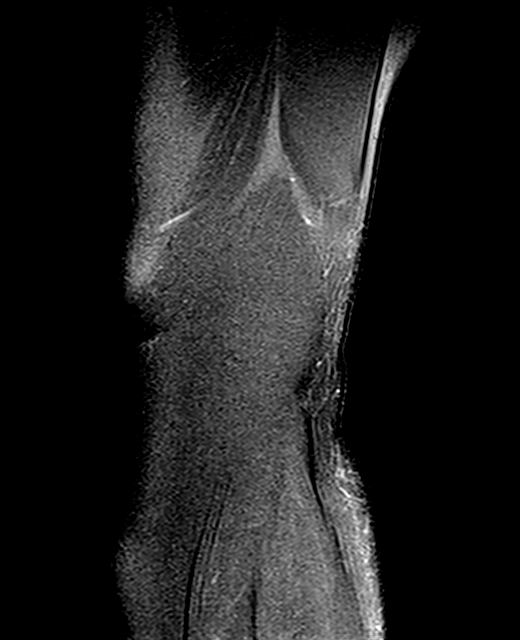
[im 14/24]
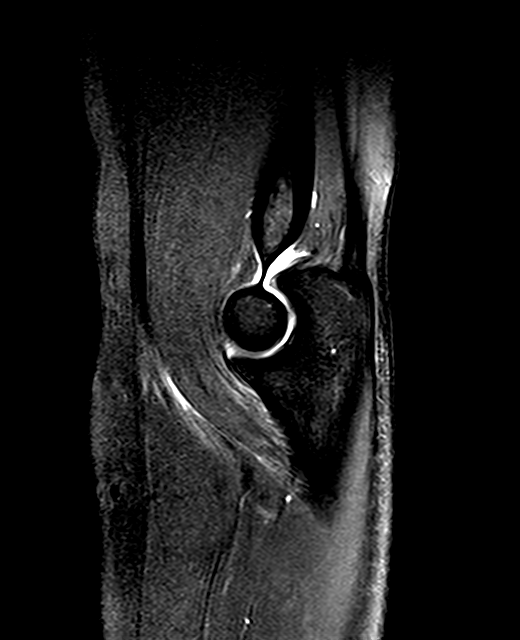
[im 20/24]
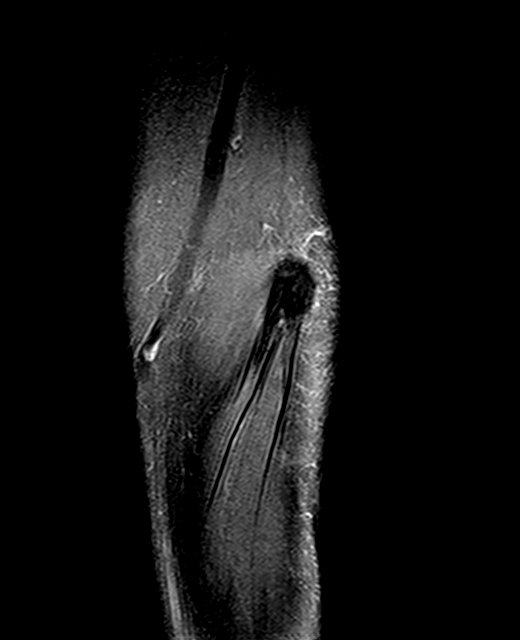

[14 of 40 positions shown; findings below may reference images not displayed]

FINDINGS: TENDONS

Common forearm flexor origin: Intact with normal signal.

Common forearm extensor origin: Moderate tendinosis with partial
intrasubstance tearing at the humeral origin. No tendon retraction
or focal muscular atrophy identified.

Biceps: Intact.

Triceps: Intact with normal signal.

LIGAMENTS

Medial stabilizers: Intact.

Lateral stabilizers: The humeral attachment of the radial collateral
ligament is not well visualized. The lateral ulnar collateral
ligament appears intact.

Cartilage: Preserved.  No focal chondral defect demonstrated.

Joint: No significant joint effusion or loose body observed.

Cubital tunnel: Unremarkable.  The ulnar nerve appears normal.

Bones: No acute or significant extra-articular osseous findings.

Other: A capsule was placed over the patient's palpable concern,
medial to the medial humeral epicondyle. There is minimal edema
within the subcutaneous fat in this region, less than that observed
in the opposite elbow. No focal fluid collection, soft tissue mass
or discrete lipoma identified.
IMPRESSION: 1. The patient's palpable concern on the right corresponds with only
minimal edema within the medial subcutaneous fat. This is less than
that observed in the opposite elbow, and no focal mass or fluid
collection is seen. Clinical follow-up recommended.
2. Moderate common extensor tendinosis with partial intrasubstance
tearing.
3. No acute osseous findings or significant arthropathic changes.

## 2021-06-10 IMAGING — MR MR ELBOW*L* W/O CM
4 of 5 series · 12 of 40 positions shown · non-contrast
Comparison: Radiographs [DATE].

CLINICAL DATA: Bilateral elbow pain with palpable lumps medially
for approximately 2 months. No known injury or prior relevant
surgery.

EXAM:
MRI OF THE LEFT ELBOW WITHOUT CONTRAST
TECHNIQUE: Multiplanar, multisequence MR imaging of the elbow was performed. No
intravenous contrast was administered.

[Series 5: T2 fat-sat · axial · left · 3.0mm · 0.19mm/px · z∈[-16,+65]mm · 3 of 27 slices shown (1 of 3)]
[im 3/27]
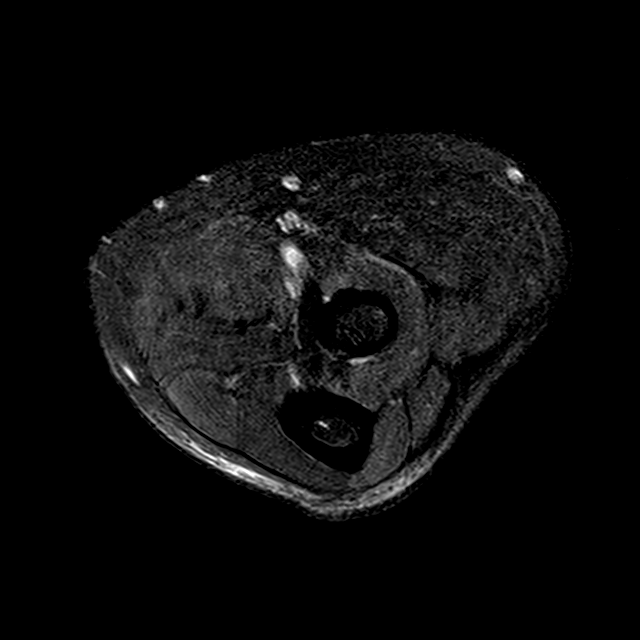
[im 15/27]
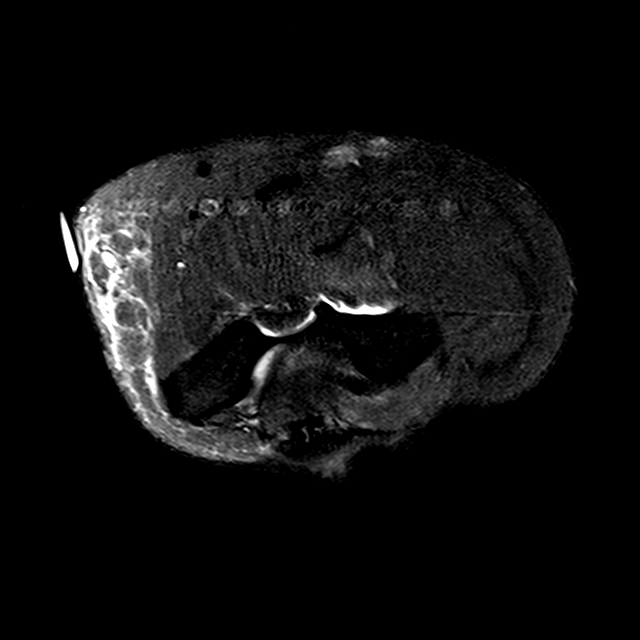
[im 24/27]
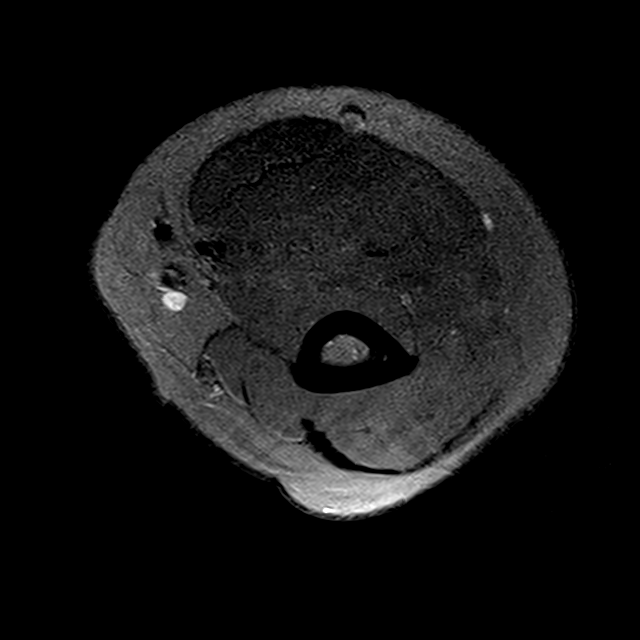

[Series 6: T2 fat-sat · oblique · left · 3.0mm · 0.22mm/px · 3 of 19 slices shown (2 of 3)]
[im 4/19]
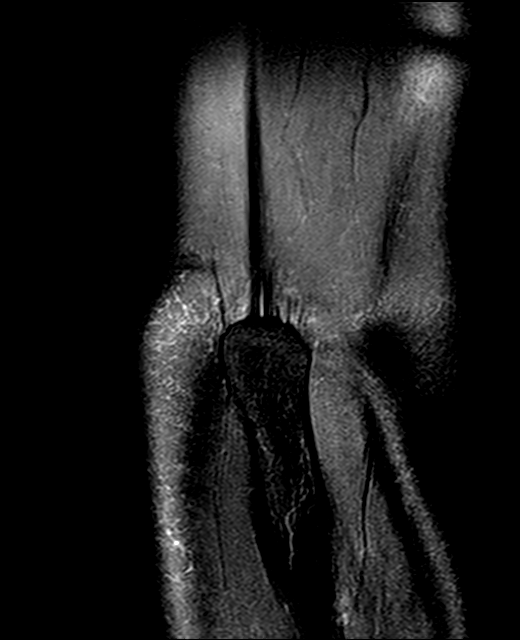
[im 11/19]
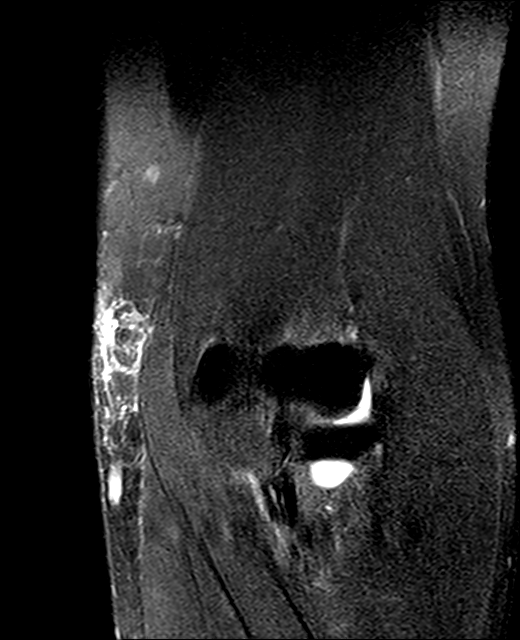
[im 19/19]
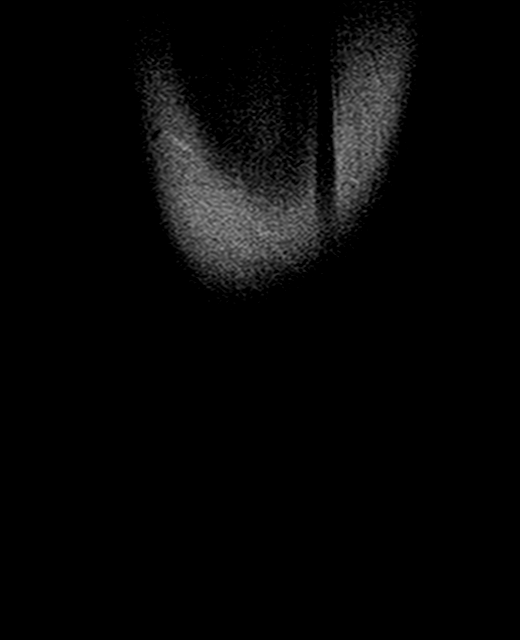

[Series 7: T1 · oblique · left · 3.0mm · 0.16mm/px · 3 of 19 slices shown]
[im 4/19]
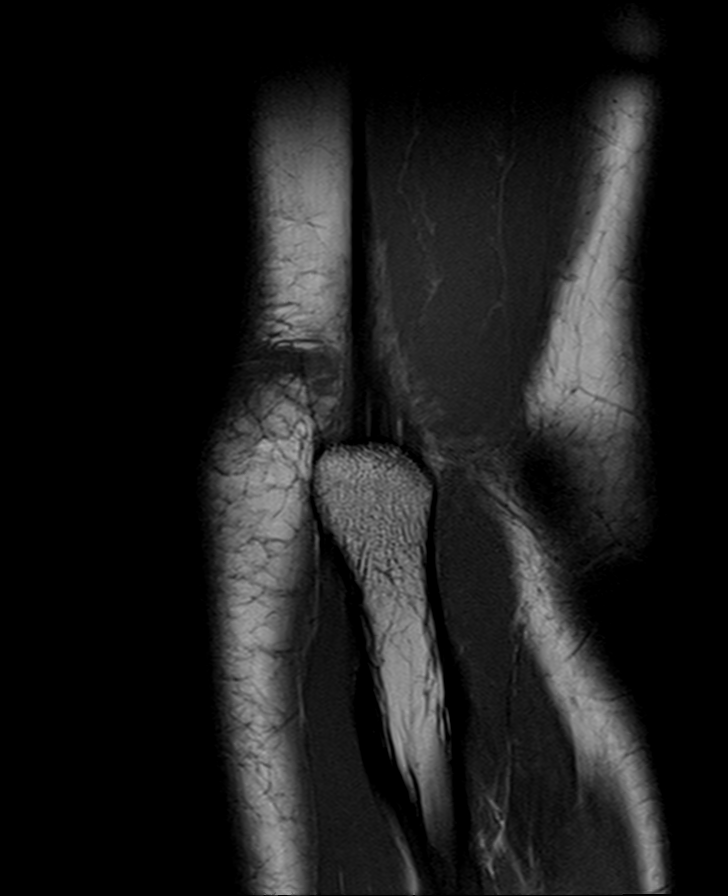
[im 11/19]
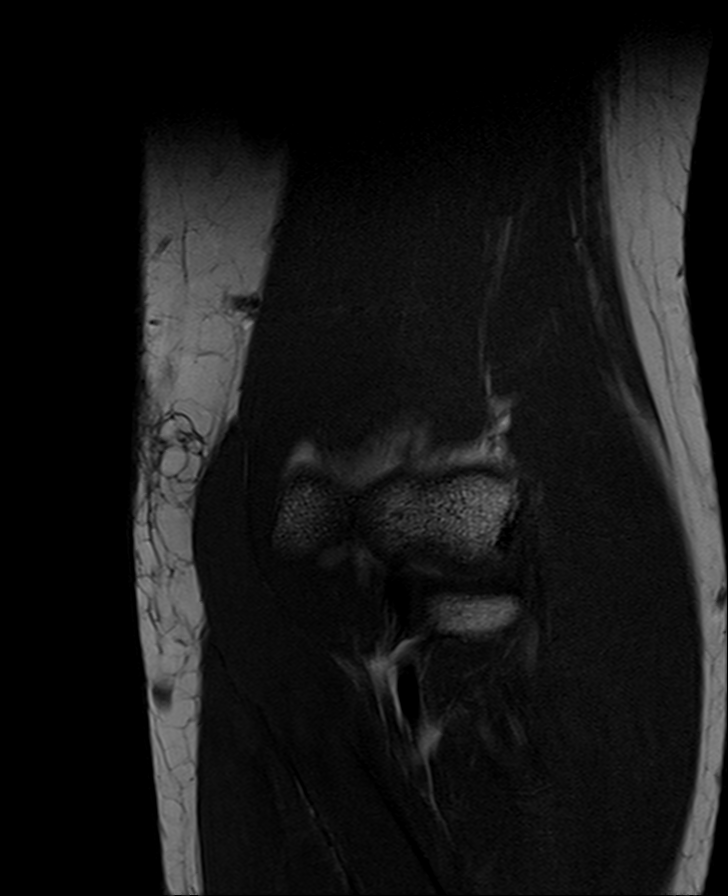
[im 19/19]
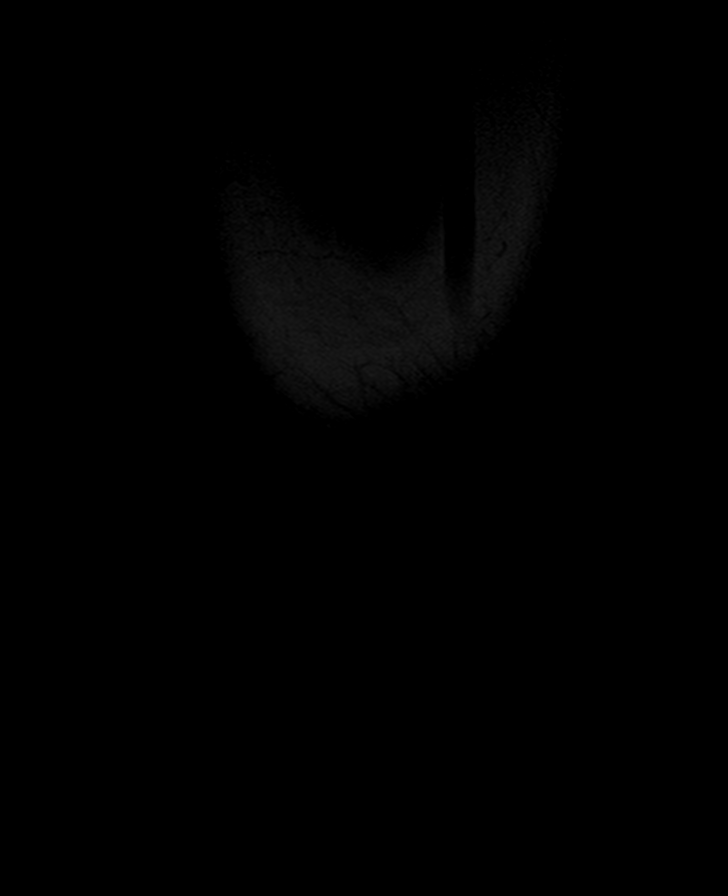

[Series 10: T2 fat-sat · oblique · left · 3.0mm · 0.22mm/px · 3 of 25 slices shown (3 of 3)]
[im 4/25]
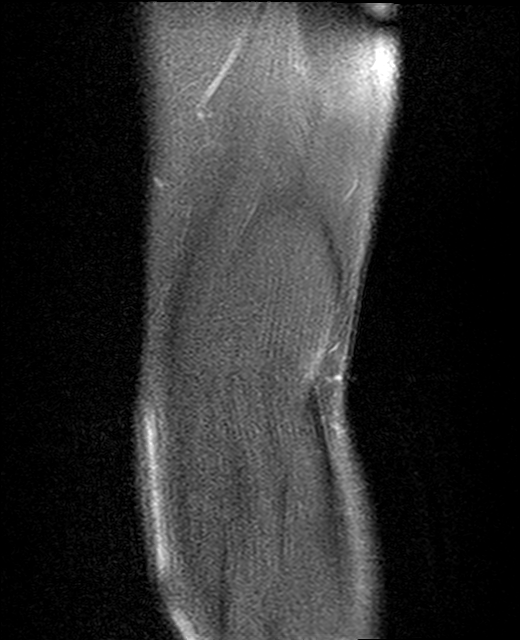
[im 13/25]
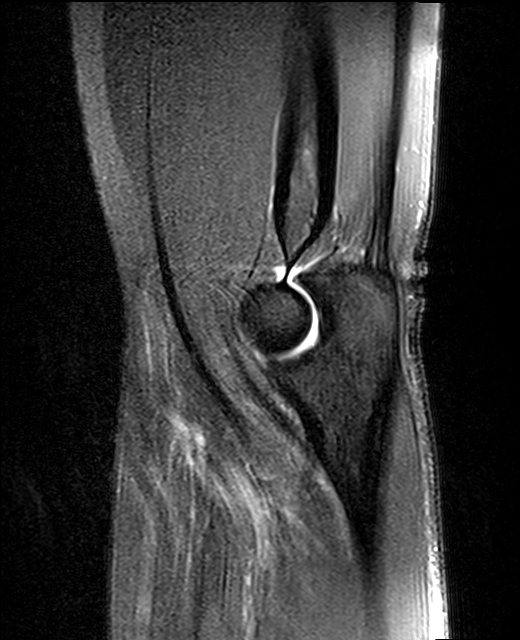
[im 22/25]
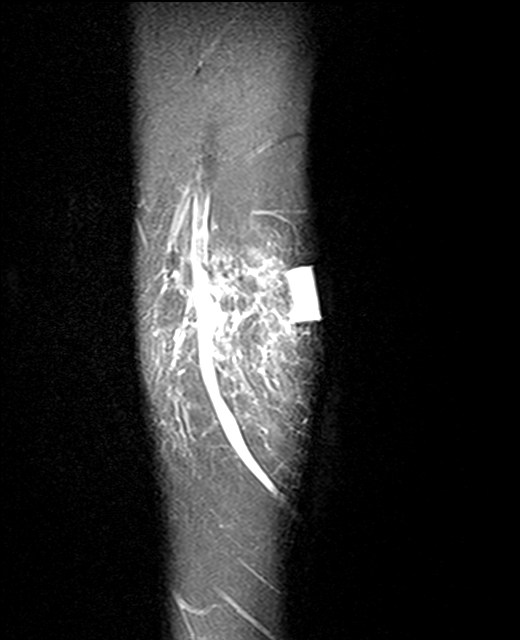

[12 of 40 positions shown; findings below may reference images not displayed]

FINDINGS: TENDONS

Common forearm flexor origin: Intact with normal signal.

Common forearm extensor origin: Mild to moderate tendinosis without
focal tear or tendon retraction.

Biceps: Intact.

Triceps: Intact with normal signal.

LIGAMENTS

Medial stabilizers: Intact.

Lateral stabilizers: The humeral attachment of the radial collateral
ligament is not well visualized. The lateral ulnar collateral
ligament appears intact.

Cartilage: Preserved.  No focal chondral defect demonstrated.

Joint: Small joint effusion. No evidence of intra-articular loose
body.

Cubital tunnel: Unremarkable.  The ulnar nerve appears normal.

Bones: No acute or significant extra-articular osseous findings.

Other: A capsule was placed over the patient's palpable concern
medial to the medial humeral epicondyle. There is underlying edema
within the subcutaneous fat which appears mildly lobulated. This
could possibly reflect an ill-defined subcutaneous lipoma. There is
no focal fluid collection or suspicious soft tissue mass. No
vascular abnormalities are apparent.
IMPRESSION: 1. The patient's palpable concern corresponds with prominent,
edematous subcutaneous fat medially, likely inflammatory. Findings
on the left could indicate an ill-defined subcutaneous lipoma.
Clinical follow-up recommended.
2. Small elbow joint effusion without focal chondral defect or acute
osseous findings.
3. Mild to moderate common extensor tendinosis without tear.

## 2021-06-16 ENCOUNTER — Ambulatory Visit (INDEPENDENT_AMBULATORY_CARE_PROVIDER_SITE_OTHER): Payer: Self-pay | Admitting: Orthopaedic Surgery

## 2021-06-16 ENCOUNTER — Encounter: Payer: Self-pay | Admitting: Orthopaedic Surgery

## 2021-06-16 ENCOUNTER — Other Ambulatory Visit: Payer: Self-pay

## 2021-06-16 DIAGNOSIS — M25522 Pain in left elbow: Secondary | ICD-10-CM

## 2021-06-16 MED ORDER — DICLOFENAC SODIUM 75 MG PO TBEC
75.0000 mg | DELAYED_RELEASE_TABLET | Freq: Two times a day (BID) | ORAL | 2 refills | Status: DC
  Filled 2021-06-16: qty 30, 15d supply, fill #0

## 2021-06-16 NOTE — Progress Notes (Signed)
Office Visit Note   Patient: Amber Travis           Date of Birth: [birth year]           MRN: [id number] Visit Date: 06/16/2021              Requested by: Claiborne Rigg, NP [address] [place],  [place] [address] PCP: Claiborne Rigg, NP   Assessment & Plan: Visit Diagnoses:  1. Pain in left elbow     Plan: MRIs of both elbows show no structural abnormalities.  She has some mild to moderate tendinosis of the common flexor origin of the left elbow.  Small joint effusion without degenerative changes.  I sent in prescription for diclofenac to try since Advil is not working.  I strongly recommend that she take some time off of weightlifting as I think this is the culprit.  Follow-up as needed.  Follow-Up Instructions: Return if symptoms worsen or fail to improve.   Orders:  Orders Placed This Encounter  Procedures   Ambulatory referral to Physical Therapy   Meds ordered this encounter  Medications   diclofenac (VOLTAREN) 75 MG EC tablet    Sig: Take 1 tablet (75 mg total) by mouth 2 (two) times daily.    Dispense:  30 tablet    Refill:  2      Procedures: No procedures performed   Clinical Data: No additional findings.   Subjective: Chief Complaint  Patient presents with   Left Elbow - Follow-up    HPI  Amber Travis comes back today for review of bilateral elbow MRI.  Pain is worse on the left side.  She is an avid Licensed conveyancer.  Pain is localized to the medial side with occasional radiation down the entire arm.  Review of Systems   Objective: Vital Signs: There were no vitals taken for this visit.  Physical Exam  Ortho Exam  Left elbow examination shows tenderness to the medial epicondyle.  There is no focal motor or sensory deficits.  Normal range of motion.  Right ankle exam is unremarkable.  Specialty Comments:  No specialty comments available.  Imaging: No results found.   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   Breast lump in female 02/04/2015   DENTAL CARIES 10/07/2010   HYPERBILIRUBINEMIA 07/09/2010   SKIN RASH 05/14/2010   ANXIETY DEPRESSION 05/04/2010   FIBROCYSTIC BREAST DISEASE 05/04/2010   UNSPECIFIED ADJUSTMENT REACTION 04/17/2009   MIGRAINE HEADACHE 01/27/2009   Depression 06/11/2008   Past Medical History:  Diagnosis Date   Carpal tunnel syndrome, right 06/2018   Depression     Family History  Problem Relation Age of Onset   Diabetes Mother     Past Surgical History:  Procedure Laterality Date   CARPAL TUNNEL RELEASE Right 06/28/2018   Procedure: RIGHT CARPAL TUNNEL RELEASE;  Surgeon: Tarry Kos, MD;  Location: [facility];  Service: Orthopedics;  Laterality: Right;   LAPAROSCOPIC APPENDECTOMY N/A 05/10/2016   Procedure: APPENDECTOMY LAPAROSCOPIC;  Surgeon: Jimmye Norman, MD;  Location: [facility] OR;  Service: General;  Laterality: N/A;   Social History  Occupational History   Not on file  Tobacco Use   Smoking status: Never   Smokeless tobacco: Never  Vaping Use   Vaping Use: Never used  Substance and Sexual Activity   Alcohol use: No   Drug use: No   Sexual activity: Yes    Birth control/protection: Pill

## 2021-06-17 ENCOUNTER — Other Ambulatory Visit (HOSPITAL_COMMUNITY): Payer: Self-pay

## 2021-06-18 ENCOUNTER — Ambulatory Visit: Payer: Self-pay | Admitting: *Deleted

## 2021-06-18 NOTE — Telephone Encounter (Signed)
Using [employer] [id number]. Patient called, left VM to return the call to the office to discuss symptoms with a nurse. Unable to reach patient after 3 attempts by [facility] NT, routing to the provider for resolution per protocol.    Message from [employer] sent at 06/18/2021  9:04 AM EDT  Summary: cold and flu liuke symptoms   The patient has experienced cold and flu like symptoms for multiple days   The patient shares that they've experienced symptoms for roughly a week that include cough, congestion and headache   Please contact further when possible   The patient will need assistance from a translator / interpreter

## 2021-06-18 NOTE — Telephone Encounter (Signed)
Called patient and scheduled a telephone with with Tonya over at [facility] for 06/19/21 at 8am.

## 2021-06-18 NOTE — Telephone Encounter (Signed)
Summary: cold and flu liuke symptoms   The patient has experienced cold and flu like symptoms for multiple days   The patient shares that they've experienced symptoms for roughly a week that include cough, congestion and headache   Please contact further when possible   The patient will need assistance from a translator / interpreter

## 2021-06-19 ENCOUNTER — Encounter: Payer: Self-pay | Admitting: Nurse Practitioner

## 2021-06-19 ENCOUNTER — Other Ambulatory Visit: Payer: Self-pay

## 2021-06-19 ENCOUNTER — Ambulatory Visit (INDEPENDENT_AMBULATORY_CARE_PROVIDER_SITE_OTHER): Payer: Self-pay | Admitting: Nurse Practitioner

## 2021-06-19 DIAGNOSIS — J069 Acute upper respiratory infection, unspecified: Secondary | ICD-10-CM

## 2021-06-19 MED ORDER — AZITHROMYCIN 250 MG PO TABS
ORAL_TABLET | ORAL | 0 refills | Status: DC
  Filled 2021-06-19 – 2021-12-23 (×2): qty 6, 5d supply, fill #0

## 2021-06-19 NOTE — Progress Notes (Signed)
Virtual Visit via Telephone Note  I connected with Amber Travis on 06/19/21 at  8:00 AM EDT by telephone and verified that I am speaking with the correct person using two identifiers.  Location: Patient: home Provider: office   I discussed the limitations, risks, security and privacy concerns of performing an evaluation and management service by telephone and the availability of in person appointments. I also discussed with the patient that there may be a patient responsible charge related to this service. The patient expressed understanding and agreed to proceed.   History of Present Illness:  Patient presents today for sick visit through telephone visit.  Spanish interpreter was used for this visit.  Patient states for the past week she has been having ongoing cough, sinus congestion, chest congestion, and fever.  She states for the past couple of days her fever has subsided.  Patient has not been tested for COVID.  We discussed that she does need to get tested either at the testing site or with a home test kit.  Patient states that she will get tested today.  Denies f/c/s, n/v/d, hemoptysis, PND, chest pain or edema.     Observations/Objective:  Vitals with BMI 04/22/2021 09/17/2020 08/27/2020  Height 4' 10"  - 4' 10"   Weight 150 lbs 3 oz 148 lbs 3 oz 143 lbs  BMI 31.4 26.71 24.58  Systolic 099 833 825  Diastolic 80 66 85  Pulse 89 80 80      Assessment and Plan:  Upper respiratory infection:  Please go get COVID test  Stay well hydrated  Stay active  Deep breathing exercises  May take tylenol for fever or pain  May take mucinex DM twice daily  Will order azithromycin   Follow up:  Follow up if needed    I discussed the assessment and treatment plan with the patient. The patient was provided an opportunity to ask questions and all were answered. The patient agreed with the plan and demonstrated an understanding of the instructions.   The patient  was advised to call back or seek an in-person evaluation if the symptoms worsen or if the condition fails to improve as anticipated.  I provided 23 minutes of non-face-to-face time during this encounter.   Fenton Foy, NP

## 2021-06-19 NOTE — Patient Instructions (Addendum)
Upper respiratory infection:  Please go get COVID test  Stay well hydrated  Stay active  Deep breathing exercises  May take tylenol for fever or pain  May take mucinex DM twice daily  Will order azithromycin   Follow up:  Follow up if needed

## 2021-06-23 ENCOUNTER — Other Ambulatory Visit: Payer: Self-pay

## 2021-06-26 ENCOUNTER — Other Ambulatory Visit: Payer: Self-pay

## 2021-06-30 ENCOUNTER — Ambulatory Visit: Payer: No Typology Code available for payment source | Admitting: Physical Therapy

## 2021-07-08 ENCOUNTER — Telehealth: Payer: Self-pay | Admitting: Family Medicine

## 2021-07-08 NOTE — Telephone Encounter (Signed)
I called Amber Travis with Interpreter [employer].  She reports history of left breast pain but states now her right breast has been causing excruciating pain for 3 days around the areola. I asked if she can feel any lumps, etc. She states she hasn't felt it because it hurts. She denies nipple discharge or skin changes or dimpling.  Per chart has Diagnostic MM 2020 that was normal and breast ultrasounds in 2017 that were normal. I advised she will need to be examined by a provider and I will have front office call her with an appointment- hopefully within next 2 weeks. She voices understanding.  Nickole Adamek,RN

## 2021-07-08 NOTE — Telephone Encounter (Signed)
Patient want to speak to a nurse about her breast pain, state it come and go

## 2021-07-09 ENCOUNTER — Telehealth: Payer: Self-pay | Admitting: Radiology

## 2021-07-09 NOTE — Telephone Encounter (Signed)
Called patient via interpreter services, scheduled appointment for 07/29/21 @ 8:55 at [facility] for extreme breast pain.

## 2021-07-16 ENCOUNTER — Other Ambulatory Visit: Payer: Self-pay | Admitting: Nurse Practitioner

## 2021-07-16 DIAGNOSIS — N644 Mastodynia: Secondary | ICD-10-CM

## 2021-07-27 ENCOUNTER — Other Ambulatory Visit: Payer: Self-pay

## 2021-07-27 ENCOUNTER — Ambulatory Visit: Payer: No Typology Code available for payment source | Attending: Nurse Practitioner

## 2021-07-27 DIAGNOSIS — N644 Mastodynia: Secondary | ICD-10-CM

## 2021-07-29 ENCOUNTER — Other Ambulatory Visit: Payer: Self-pay

## 2021-07-29 ENCOUNTER — Ambulatory Visit (INDEPENDENT_AMBULATORY_CARE_PROVIDER_SITE_OTHER): Payer: No Typology Code available for payment source | Admitting: Family Medicine

## 2021-07-29 ENCOUNTER — Encounter: Payer: Self-pay | Admitting: Family Medicine

## 2021-07-29 VITALS — BP 139/84 | HR 90 | Wt 152.2 lb

## 2021-07-29 DIAGNOSIS — F419 Anxiety disorder, unspecified: Secondary | ICD-10-CM

## 2021-07-29 DIAGNOSIS — Z30431 Encounter for routine checking of intrauterine contraceptive device: Secondary | ICD-10-CM

## 2021-07-29 DIAGNOSIS — N644 Mastodynia: Secondary | ICD-10-CM

## 2021-07-29 MED ORDER — HYDROXYZINE HCL 25 MG PO TABS
25.0000 mg | ORAL_TABLET | Freq: Every day | ORAL | 3 refills | Status: DC
  Filled 2021-07-29 – 2021-09-30 (×2): qty 30, 30d supply, fill #0
  Filled 2021-10-23: qty 30, 30d supply, fill #1
  Filled 2021-11-13: qty 30, 30d supply, fill #2
  Filled 2021-11-30: qty 30, 30d supply, fill #3

## 2021-07-29 NOTE — Progress Notes (Signed)
GYNECOLOGY OFFICE VISIT NOTE  History:   Amber Travis is a 40 y.o. [id number] here today to discuss chronic breast pain. She would also like her IUD strings checked and a refill of her anxiety medication.  In-person Spanish interpreter used for entirety of visit.   Breast pain: -Bilateral -Ongoing for the last 5-6 years  -Brief, sharp pain; only a few seconds in duration then goes away  -No nipple discharge or superficial skin changes -She has not noted any new masses -Has mammogram and [place] already ordered -Has taken Ibuprofen, no other treatments thus far -Here today for further evaluation   IUD check: - Had Liletta IUD placed 08/27/20 - Would like string check today - No other concerns or problems with device  - Has tried to check the strings at home but has a hard time feeling them   Anxiety: - Taking Hydroxyzine nightly for this - Feels this helps her symptoms significantly and also helps with sleep - Requesting refill today    Past Medical History:  Diagnosis Date   Carpal tunnel syndrome, right 06/2018   Depression     Past Surgical History:  Procedure Laterality Date   CARPAL TUNNEL RELEASE Right 06/28/2018   Procedure: RIGHT CARPAL TUNNEL RELEASE;  Surgeon: Tarry Kos, MD;  Location: [facility];  Service: Orthopedics;  Laterality: Right;   LAPAROSCOPIC APPENDECTOMY N/A 05/10/2016   Procedure: APPENDECTOMY LAPAROSCOPIC;  Surgeon: Jimmye Norman, MD;  Location: [facility] OR;  Service: General;  Laterality: N/A;   The following portions of the patient's history were reviewed and updated as appropriate: allergies, current medications, past family history, past medical history, past social history, past surgical history and problem list.   Health Maintenance:  Last pap 2019, NILM. Repeat due. Will complete with BCCPT program.  Review of Systems:  Pertinent items noted in HPI and remainder of comprehensive ROS otherwise negative.  Physical Exam:   BP 139/84   Pulse 90   Wt 152 lb 3.2 oz (69 kg)   BMI 31.81 kg/m   Chaperone present for exam.   CONSTITUTIONAL: Well-developed, well-nourished female in no acute distress.  HEENT:  Normocephalic, atraumatic. EOMI, conjunctivae clear. CARDIOVASCULAR: Normal heart rate noted. RESPIRATORY: Normal work of breathing on room air.  BREAST: Normal shape and contour bilaterally, no masses palpated, no axillary or supraclavicular LAD, no nipple discharge, mildly tender to palpation in right lower outer quadrant of right breast, no superficial skin changes noted.  PELVIC: Normal appearing external genitalia; normal urethral meatus; normal appearing vaginal mucosa and cervix.  IUD strings visualized exiting the cervical os. No abnormal discharge noted. SKIN: No rashes or lesions noted. MUSCULOSKELETAL: Normal range of motion. NEUROLOGIC: Alert and oriented to person, place, and time. PSYCHIATRIC: Normal mood and affect. Normal behavior. Normal judgment and thought content.  Assessment and Plan:   1. Breast pain Chronic and ongoing for the last 5-6 years. Mammogram and [place] ordered which is appropriate. No abnormalities noted on exam aside from mild tenderness to palpation in the right breast. Discussed that the nature of her pain is reassuring. Possibly hormonal in nature. Recommended that we follow up after imaging to discuss next steps pending results. Will follow up in 4 weeks.  2. IUD check up IUD strings visualized in appropriate position. Reassurance provided.   3. Anxiety Chronic, stable. Controlled with current medication regimen. Refill provided for patient. Advised to discuss with provider should symptoms worsen.  - hydrOXYzine (ATARAX/VISTARIL) 25 MG tablet; Take 1 tablet (  25 mg total) by mouth at bedtime.  Dispense: 90 tablet; Refill: 3  Routine preventative health maintenance measures emphasized. Patient will call BCCPT program to schedule her next pap smear which is due.  Return  in about 4 weeks (around 08/26/2021) for follow up visit (breast pain).    Evalina Field, MD OB Fellow, Faculty [facility], [facility] for [facility] 07/29/2021 12:54 PM

## 2021-08-13 ENCOUNTER — Ambulatory Visit
Admission: RE | Admit: 2021-08-13 | Discharge: 2021-08-13 | Disposition: A | Discharge status: Home / Self Care | Payer: No Typology Code available for payment source | Source: Ambulatory Visit | Attending: Obstetrics and Gynecology | Admitting: Obstetrics and Gynecology

## 2021-08-13 ENCOUNTER — Other Ambulatory Visit: Payer: Self-pay

## 2021-08-13 ENCOUNTER — Ambulatory Visit: Payer: No Typology Code available for payment source

## 2021-08-13 ENCOUNTER — Ambulatory Visit: Payer: Self-pay | Admitting: *Deleted

## 2021-08-13 VITALS — BP 132/92 | Wt 146.1 lb

## 2021-08-13 DIAGNOSIS — N644 Mastodynia: Secondary | ICD-10-CM

## 2021-08-13 DIAGNOSIS — Z01419 Encounter for gynecological examination (general) (routine) without abnormal findings: Secondary | ICD-10-CM

## 2021-08-13 NOTE — Patient Instructions (Signed)
Explained breast self awareness with [name]. Pap smear completed today. Let her know BCCCP will cover Pap smears and HPV typing every 5 years unless has a history of abnormal Pap smears. Referred patient to the [facility] for a diagnostic mammogram. Appointment scheduled Thursday, August 13, 2021 at 1240. Patient aware of appointment and will be there. Let patient know will follow up with her within the next couple weeks with results of her Pap smear by phone. Amber Travis verbalized understanding.  Vidal Lampkins, Kathaleen Maser, RN 10:27 AM

## 2021-08-13 NOTE — Progress Notes (Signed)
Amber Travis is a 40 y.o. [id number] female who presents to [facility] clinic today with complaint of bilateral breast pain x 7-8 years. Patient states the pain Patient states the pain is constant. Patient rates the pain within the left breast at a 3 out of 10 and when touched 6 out of 10. Patient rates the pain within the right breast at a 7 out of 10.   Pap Smear: Pap smear completed today. Last Pap smear was 04/05/2018 at [facility] and was normal. Per patient has no history of an abnormal Pap smear. Last Pap smear result is available in Epic.   Physical exam: Breasts Breasts symmetrical. No skin abnormalities bilateral breasts. No nipple retraction bilateral breasts. No nipple discharge bilateral breasts. No lymphadenopathy. No lumps palpated bilateral breasts. Complaints of bilateral diffuse breast pain on exam.      MM DIAG BREAST TOMO BILATERAL  Result Date: 09/03/2016 CLINICAL DATA:  40 year old presenting with focal pain and tenderness in the upper outer quadrant of the right breast and focal pain and tenderness associated with palpable lumpiness in the upper outer left breast. EXAM: 2D DIGITAL DIAGNOSTIC BILATERAL MAMMOGRAM WITH CAD AND ADJUNCT TOMO LIMITED ULTRASOUND BILATERAL BREASTS COMPARISON:  Mammography 11/07/2015 (left), 02/12/2015 (bilateral). Left breast ultrasound 02/12/2015 and earlier. ACR Breast Density Category c: The breast tissue is heterogeneously dense, which may obscure small masses. FINDINGS: Standard 2D and tomosynthesis full field CC and MLO views of both breasts were obtained. Standard and tomosynthesis spot tangential views of the areas of concern in both breasts were also obtained. No mammographic abnormality underlying the area of focal pain and tenderness in the upper outer right breast. No findings suspicious for malignancy in the right breast. No mammographic abnormality in the area of focal pain and lumpiness in the  upper outer left breast. Normal fibroglandular tissue is visible in the area of palpable concern. No findings suspicious for malignancy in the left breast. Mammographic images were processed with CAD. On physical exam, the tissues of the upper outer quadrants of both breasts have a "lumpy bumpy" texture, though I do not palpate a discrete mass in either breast. Targeted right breast ultrasound is performed, showing normal fibroglandular tissue in the upper outer quadrant in the area of focal pain. No cyst, solid mass or abnormal acoustic shadowing is identified. Targeted left breast ultrasound is performed, showing normal fibroglandular tissue in the upper outer quadrant in the area of lump thickness and focal pain. No cyst, solid mass or abnormal acoustic shadowing is identified. IMPRESSION: No mammographic or sonographic evidence of malignancy involving either breast. RECOMMENDATION: Screening mammogram at age 41 unless there are persistent or intervening clinical concerns. (Code:SM-B-40A) I have discussed the findings and recommendations with the patient. Communication with the patient was achieved with the assistance of a certified interpreter. Results were also provided in writing at the conclusion of the visit. If applicable, a reminder letter will be sent to the patient regarding the next appointment. BI-RADS CATEGORY  1: Negative. Electronically Signed   By: Hulan Saas M.D.   On: 09/03/2016 15:28   MS DIGITAL DIAG TOMO BILAT  Result Date: 09/28/2018 CLINICAL DATA:  40 year old female with bilateral diffuse breast pain. EXAM: DIGITAL DIAGNOSTIC BILATERAL MAMMOGRAM WITH CAD AND TOMO COMPARISON:  Previous exam(s). ACR Breast Density Category c: The breast tissue is heterogeneously dense, which may obscure small masses. FINDINGS: No suspicious masses or calcifications are seen in either breast. There is no mammographic evidence  of malignancy in either breast. Mammographic images were processed with  CAD. IMPRESSION: No mammographic evidence of malignancy in either breast. RECOMMENDATION: 1. Recommend further management of nonfocal bilateral breast pain be based on clinical assessment. 2. Screening mammogram at age 77 unless there are persistent or intervening clinical concerns. (Code:SM-B-40A) I have discussed the findings and recommendations with the patient. Results were also provided in writing at the conclusion of the visit. If applicable, a reminder letter will be sent to the patient regarding the next appointment. BI-RADS CATEGORY  1: Negative. Electronically Signed   By: Edwin Cap M.D.   On: 09/28/2018 11:36     Pelvic/Bimanual Ext Genitalia No lesions, no swelling and no discharge observed on external genitalia.        Vagina Vagina pink and normal texture. No lesions or blood observed in vagina. Patient stated she has AUB. Patient is being followed by the [facility] for [facility] care.       Cervix Cervix is present. Cervix pink and of normal texture. Blood observed on cervix. IUD strings visualized.   Uterus Uterus is present and palpable. Uterus in normal position and normal size.        Adnexae Bilateral ovaries present and palpable. No tenderness on palpation.         Rectovaginal No rectal exam completed today since patient had no rectal complaints. No skin abnormalities observed on exam.     Smoking History: Patient has never smoked.   Patient Navigation: Patient education provided. Access to services provided for patient through [place] program. Spanish interpreter Natale Lay from [facility] provided.    Breast and Cervical Cancer Risk Assessment: Patient does not have family history of breast cancer, known genetic mutations, or radiation treatment to the chest before age 45. Patient does not have history of cervical dysplasia, immunocompromised, or DES exposure in-utero.  Risk Assessment     Risk Scores       08/13/2021 09/28/2018   Last edited  by: Meryl Dare, CMA Azusena Erlandson, Carlye Grippe, RN   5-year risk: 0.2 % 0.2 %   Lifetime risk: 4.7 % 4.8 %            A: BCCCP exam with pap smear Complaint of bilateral breast pain.  P: Referred patient to the [facility] for a diagnostic mammogram. Appointment scheduled Thursday, August 13, 2021 at 1240.  Priscille Heidelberg, RN 08/13/2021 10:27 AM

## 2021-08-17 LAB — CYTOLOGY - PAP
Comment: NEGATIVE
Diagnosis: NEGATIVE
High risk HPV: NEGATIVE

## 2021-08-18 ENCOUNTER — Telehealth: Payer: Self-pay

## 2021-08-18 NOTE — Telephone Encounter (Addendum)
Called patient via Erika McReynolds, [employer] to give pap smear results. Informed patient that pap smear was normal and HPV was negative. Based on this result her next pap smear will be due in 5 years. Patient voiced understanding.  

## 2021-08-20 ENCOUNTER — Ambulatory Visit: Payer: Self-pay | Admitting: *Deleted

## 2021-08-20 ENCOUNTER — Telehealth: Payer: Self-pay | Admitting: Nurse Practitioner

## 2021-08-20 NOTE — Telephone Encounter (Signed)
PHENTERMINE DENIED.

## 2021-08-20 NOTE — Telephone Encounter (Signed)
Pt stated she had a physical at another office and her BP was off / she wasn't sure what her BP was at the time /and she was advised to call PCP/ pt also mention she was taking anxiety pills and asked if Zelda can prescribe this for her / please advise   Called patient via interpreter line ID [phone] and call was disconnected. Called patient back via interpreter line ID# [phone] and able to review information with patient.  Patient requesting medication Phentermina 25 mg to be ordered. Reports she has not been sleeping well and has increased anxiety due to issues with her partner. Recommended to contact PCP during her [facility] visit 08/13/21 due to BP 132/92. C/o headaches, feeling weak and tired. Denies blurred vision , chest pain, weakness on either side. Earliest appt scheduled for 09/17/21. Care advise given. Patient verbalized understanding of care advise and to call back if symptoms worsen. Please advise if medication can be given or earlier appt . Reason for Disposition  [1] Systolic BP  >= 130 OR Diastolic >= 80 AND [2] not taking BP medications  Answer Assessment - Initial Assessment Questions 1. BLOOD PRESSURE: "What is the blood pressure?" "Did you take at least two measurements 5 minutes apart?"     Unable to check no BP monitor  2. ONSET: "When did you take your blood pressure?"     08/13/21 at well woman visit  BP 132/92 3. HOW: "How did you obtain the blood pressure?" (e.g., visiting nurse, automatic home BP monitor)     At Dr office  4. HISTORY: "Do you have a history of high blood pressure?"     no 5. MEDICATIONS: "Are you taking any medications for blood pressure?" "Have you missed any doses recently?"     na 6. OTHER SYMPTOMS: "Do you have any symptoms?" (e.g., headache, chest pain, blurred vision, difficulty breathing, weakness)     Tired, headaches, stress anxiety from partner  7. PREGNANCY: "Is there any chance you are pregnant?" "When was your last menstrual period?"      na  Protocols used: Blood Pressure - High-A-AH

## 2021-08-20 NOTE — Telephone Encounter (Signed)
Patient to discuss potential HTN and concern for anxiety medication at next OV.

## 2021-08-20 NOTE — Telephone Encounter (Signed)
Call placed to patient. Attempted to clarify which medication patient was referring to. She is requesting phentermine. She tells me she gets this from a weight loss clinic. I told her that this medication is for weight loss, not anxiety and that she has hydroxyzine on file that is used for anxiety. She insists that the phentermine is what she is looking for. I told her that our providers do not typically write for phentermine and that she should continue getting this rx from the weight loss clinic.

## 2021-08-20 NOTE — Telephone Encounter (Signed)
Pt called and asked if [facility] can fill anxiety medication for her/ she stated she gets them from another clinic that she pays for them and wanted to see if her PCP can prescribe / please advise

## 2021-08-24 ENCOUNTER — Telehealth: Payer: Self-pay

## 2021-08-24 NOTE — Telephone Encounter (Signed)
Called patient via Natale Lay, Haroldine Laws to deliver the following message per Fonnie Mu:  Please let patient know that she needs to follow-up with PCP on her breast pain per recommendation by Radiologist. If patient doesn't have PCP please send her the list we share with patients.   Patient is scheduled for follow-up with [facility] on 08/26/21 for this issue. Patient is also being followed by PCP @ [facility]. Informed patient that any follow-up appointments related to this issue will not be covered by BCCCP. Patient voiced understanding.

## 2021-08-26 ENCOUNTER — Ambulatory Visit: Payer: No Typology Code available for payment source | Admitting: Family Medicine

## 2021-09-09 ENCOUNTER — Ambulatory Visit: Payer: Self-pay | Admitting: *Deleted

## 2021-09-09 NOTE — Telephone Encounter (Signed)
The patient experienced dizziness this morning 09/09/21 at 10:30 approx   The patient would like to discuss further when possible   The patient will need the assistance of translation   Attempted to reach pt, left VM to call back to discuss symptoms.  Assisted by [facility]  Interpreter # [phone]

## 2021-09-09 NOTE — Telephone Encounter (Signed)
3rd attempt to reach pt, left VM to call back. Routing to provider for resolution per protocol.  Assisted by Interpreter # [phone]

## 2021-09-09 NOTE — Telephone Encounter (Signed)
2nd attempt- Patient called, left VM to return the call to the office to discuss symptoms with a nurse.  °

## 2021-09-17 ENCOUNTER — Ambulatory Visit: Payer: Self-pay

## 2021-09-17 ENCOUNTER — Ambulatory Visit: Payer: Self-pay | Attending: Physician Assistant | Admitting: Physician Assistant

## 2021-09-17 ENCOUNTER — Telehealth: Payer: Self-pay | Admitting: Physician Assistant

## 2021-09-17 ENCOUNTER — Other Ambulatory Visit: Payer: Self-pay

## 2021-09-17 DIAGNOSIS — F341 Dysthymic disorder: Secondary | ICD-10-CM

## 2021-09-17 DIAGNOSIS — R42 Dizziness and giddiness: Secondary | ICD-10-CM

## 2021-09-17 DIAGNOSIS — R5383 Other fatigue: Secondary | ICD-10-CM

## 2021-09-17 DIAGNOSIS — Z789 Other specified health status: Secondary | ICD-10-CM

## 2021-09-17 NOTE — Progress Notes (Signed)
Patient ID: Amber Travis, female   DOB: 28-Dec-1980, 41 y.o.   MRN: [id number] Virtual Visit via Telephone Note  I connected with Amber Travis on 09/17/21 at 10:10 AM EST by telephone and verified that I am speaking with the correct person using two identifiers.  Location: Patient: home Provider: [facility] office Mariana Kaufman WU#[id number]   I discussed the limitations, risks, security and privacy concerns of performing an evaluation and management service by telephone and the availability of in person appointments. I also discussed with the patient that there may be a patient responsible charge related to this service. The patient expressed understanding and agreed to proceed.   History of Present Illness:  3 weeks h/o fatigue and occasional dizziness.  No HA/CP/SOB.  No vision changes.  No syncope.  Drinks water but maybe not enough  used to be on effexor and feels like she may want to go back on it but also does not want to "have to take" medicine.  She would like to try counseling.  She and her partner are having some issues.  She denies SI/HI.  Protective factors: faith and family    Observations/Objective:  NAD.  A&Ox3   Assessment and Plan: 1. Other fatigue - Comprehensive metabolic panel; Future - Thyroid Panel With TSH; Future  2. Dizziness No red flags.  Increase water intake.  If worsens, call 911 or ED-patient verbalizes understanding - Comprehensive metabolic panel; Future - Thyroid Panel With TSH; Future  3. Language barrier Pacific interpreters used and additional time performing visit was required. Mariana Kaufman YN#[id number]  4. ANXIETY DEPRESSION Counseled on self-care, nutrition, exercise - Vitamin D, 25-hydroxy; Future    Follow Up Instructions: See PCP in 2-3 months   I discussed the assessment and treatment plan with the patient. The patient was provided an opportunity to ask questions and all were answered. The patient agreed with the plan and  demonstrated an understanding of the instructions.   The patient was advised to call back or seek an in-person evaluation if the symptoms worsen or if the condition fails to improve as anticipated.  I provided 18 minutes of non-face-to-face time during this encounter.   Georgian Co, PA-C

## 2021-09-18 ENCOUNTER — Telehealth: Payer: Self-pay

## 2021-09-18 LAB — COMPREHENSIVE METABOLIC PANEL
ALT: 18 IU/L (ref 0–32)
AST: 16 IU/L (ref 0–40)
Albumin/Globulin Ratio: 2.2 (ref 1.2–2.2)
Albumin: 4.9 g/dL — ABNORMAL HIGH (ref 3.8–4.8)
Alkaline Phosphatase: 79 IU/L (ref 44–121)
BUN/Creatinine Ratio: 23 (ref 9–23)
BUN: 19 mg/dL (ref 6–24)
Bilirubin Total: 2.1 mg/dL — ABNORMAL HIGH (ref 0.0–1.2)
CO2: 21 mmol/L (ref 20–29)
Calcium: 9.7 mg/dL (ref 8.7–10.2)
Chloride: 104 mmol/L (ref 96–106)
Creatinine, Ser: 0.84 mg/dL (ref 0.57–1.00)
Globulin, Total: 2.2 g/dL (ref 1.5–4.5)
Glucose: 83 mg/dL (ref 70–99)
Potassium: 4.3 mmol/L (ref 3.5–5.2)
Sodium: 143 mmol/L (ref 134–144)
Total Protein: 7.1 g/dL (ref 6.0–8.5)
eGFR: 90 mL/min/{1.73_m2} (ref >59)

## 2021-09-18 LAB — THYROID PANEL WITH TSH
Free Thyroxine Index: 2.4 (ref 1.2–4.9)
T3 Uptake Ratio: 29 % (ref 24–39)
T4, Total: 8.4 ug/dL (ref 4.5–12.0)
TSH: 0.759 u[IU]/mL (ref 0.450–4.500)

## 2021-09-18 LAB — VITAMIN D 25 HYDROXY (VIT D DEFICIENCY, FRACTURES): Vit D, 25-Hydroxy: 31.5 ng/mL (ref 30.0–100.0)

## 2021-09-18 NOTE — Telephone Encounter (Signed)
Pt has been scheduled and reminder has been mailed.  

## 2021-09-18 NOTE — Telephone Encounter (Signed)
-----   Message from Anders Simmonds, [place] sent at 09/17/2021 10:22 AM EST ----- See PCP in 2-3 months

## 2021-09-21 ENCOUNTER — Telehealth: Payer: Self-pay | Admitting: Nurse Practitioner

## 2021-09-21 NOTE — Telephone Encounter (Signed)
Copied from CRM [id number]. Topic: General - Other >> Sep 18, 2021  3:53 PM Pawlus, Maxine Glenn A wrote: Reason for CRM: Pt called in to go over her latest lab results, please advise.  Pt requires Spanish interpreter

## 2021-09-25 NOTE — Telephone Encounter (Signed)
Results given by PEC 

## 2021-09-30 ENCOUNTER — Other Ambulatory Visit: Payer: Self-pay

## 2021-10-02 ENCOUNTER — Other Ambulatory Visit: Payer: Self-pay

## 2021-10-12 ENCOUNTER — Ambulatory Visit: Payer: Self-pay

## 2021-10-12 ENCOUNTER — Other Ambulatory Visit: Payer: Self-pay

## 2021-10-12 NOTE — Telephone Encounter (Signed)
Using [employer] NI#[id number].  Chief Complaint: Refill on Phentermine used for anxiety/depression Symptoms: Feeling nervous, no thoughts suicide, no medication left Frequency: Ongoing Pertinent Negatives: Patient denies Suicidal thoughts Disposition: [] ED /[] Urgent Care (no appt availability in office) / [] Appointment(In office/virtual)/ [x]  [facility]/ [] Home Care/ [] Refused Recommended Disposition /[] [facility] [facility]/ []  Follow-up with PCP Additional Notes: Patient would like Phentermine used for her anxiety/depression prescribed by PCP. She says she is out of the medication and it keeps her calm during the day so she can function. She takes Hydralazine at night for sleep. Advised virtual appt tomorrow, she agrees.     Summary: Requesting Medication.   Pt requesting PCP prescribe phentermine 30 mg for anxiety.  Pt stated she has anxiety and depression and this helps her.   Seeking clinical call.   Needs spanish interpreter.      Reason for Disposition  Caller requesting a CONTROLLED substance prescription refill (e.g., narcotics, ADHD medicines)  Answer Assessment - Initial Assessment Questions 1. DRUG NAME: "What medicine do you need to have refilled?"     Phentermine 2. REFILLS REMAINING: "How many refills are remaining?" (Note: The label on the medicine or pill bottle will show how many refills are remaining. If there are no refills remaining, then a renewal may be needed.)     N/A 3. EXPIRATION DATE: "What is the expiration date?" (Note: The label states when the prescription will expire, and thus can no longer be refilled.)     N/A 4. PRESCRIBING HCP: "Who prescribed it?" Reason: If prescribed by specialist, call should be referred to that group.     Counseloer 5. SYMPTOMS: "Do you have any symptoms?"     Feeling nervous today because I didn't take it today 6. PREGNANCY: "Is there any chance that you are pregnant?" "When was your last  menstrual period?"     N/A  Protocols used: Medication Refill and Renewal Call-A-AH

## 2021-10-13 ENCOUNTER — Other Ambulatory Visit: Payer: Self-pay

## 2021-10-13 ENCOUNTER — Ambulatory Visit: Payer: Self-pay | Attending: Nurse Practitioner | Admitting: Nurse Practitioner

## 2021-10-13 ENCOUNTER — Telehealth: Payer: Self-pay | Admitting: Nurse Practitioner

## 2021-10-13 NOTE — Telephone Encounter (Signed)
Spanish INterpreter HARRISON [phone] NO answer. LVM

## 2021-10-23 ENCOUNTER — Other Ambulatory Visit: Payer: Self-pay

## 2021-10-27 ENCOUNTER — Other Ambulatory Visit: Payer: Self-pay

## 2021-11-09 NOTE — Telephone Encounter (Signed)
I attempted to call pt again via interpreter [phone]), no answer, unable to leave vm.

## 2021-11-13 ENCOUNTER — Other Ambulatory Visit: Payer: Self-pay

## 2021-11-16 ENCOUNTER — Other Ambulatory Visit: Payer: Self-pay

## 2021-11-30 ENCOUNTER — Other Ambulatory Visit: Payer: Self-pay

## 2021-12-04 ENCOUNTER — Other Ambulatory Visit: Payer: Self-pay

## 2021-12-07 ENCOUNTER — Ambulatory Visit: Payer: Self-pay | Admitting: *Deleted

## 2021-12-07 NOTE — Telephone Encounter (Signed)
Reason for Disposition ? [1] MILD-MODERATE headache AND [2] present > 72 hours ? ?Answer Assessment - Initial Assessment Questions ?1. LOCATION: "Where does it hurt?"  ?    Pt calling in with a Spanish interpreter.    Having a headache. ?I've had a headache for 2 weeks.   ?2. ONSET: "When did the headache start?" (Minutes, hours or days)  ?    2 weeks ago   It's a migraine and I'm having nausea.  No medication for it. ?3. PATTERN: "Does the pain come and go, or has it been constant since it started?" ?    Intermittently ?4. SEVERITY: "How bad is the pain?" and "What does it keep you from doing?"  (e.g., Scale 1-10; mild, moderate, or severe) ?  - MILD (1-3): doesn't interfere with normal activities  ?  - MODERATE (4-7): interferes with normal activities or awakens from sleep  ?  - SEVERE (8-10): excruciating pain, unable to do any normal activities    ?    *No Answer* ?5. RECURRENT SYMPTOM: "Have you ever had headaches before?" If Yes, ask: "When was the last time?" and "What happened that time?"  ?    Yes  migraines ?6. CAUSE: "What do you think is causing the headache?" ?    *No Answer* ?7. MIGRAINE: "Have you been diagnosed with migraine headaches?" If Yes, ask: "Is this headache similar?"  ?    *No Answer* ?8. HEAD INJURY: "Has there been any recent injury to the head?"  ?    No ?9. OTHER SYMPTOMS: "Do you have any other symptoms?" (fever, stiff neck, eye pain, sore throat, cold symptoms) ?    *No Answer* ?10. PREGNANCY: "Is there any chance you are pregnant?" "When was your last menstrual period?" ?      *No Answer* ? ?Protocols used: Headache-A-AH ? ?

## 2021-12-07 NOTE — Telephone Encounter (Addendum)
?  Chief Complaint: Migraine headache and urinary incontinence ?Symptoms: Headache for 2 weeks.   Trouble holding her urine ?Frequency: 2 weeks for headache ?Pertinent Negatives: Patient denies N/A ?Disposition: [] ED /[] Urgent Care (no appt availability in office) / [x] Appointment(In office/virtual)/ []  [place] [facility]/ [] Home Care/ [] Refused Recommended Disposition /[] [facility]/ []  Follow-up with PCP ?Additional Notes:  Spanish interpreter [phone] ?

## 2021-12-23 ENCOUNTER — Encounter: Payer: Self-pay | Admitting: Nurse Practitioner

## 2021-12-23 ENCOUNTER — Other Ambulatory Visit: Payer: Self-pay

## 2021-12-23 ENCOUNTER — Ambulatory Visit: Payer: Self-pay | Attending: Nurse Practitioner | Admitting: Nurse Practitioner

## 2021-12-23 VITALS — BP 121/76 | HR 55 | Wt 157.6 lb

## 2021-12-23 DIAGNOSIS — N393 Stress incontinence (female) (male): Secondary | ICD-10-CM

## 2021-12-23 DIAGNOSIS — F32A Depression, unspecified: Secondary | ICD-10-CM

## 2021-12-23 DIAGNOSIS — F419 Anxiety disorder, unspecified: Secondary | ICD-10-CM

## 2021-12-23 DIAGNOSIS — K760 Fatty (change of) liver, not elsewhere classified: Secondary | ICD-10-CM

## 2021-12-23 DIAGNOSIS — R5383 Other fatigue: Secondary | ICD-10-CM

## 2021-12-23 DIAGNOSIS — Z1159 Encounter for screening for other viral diseases: Secondary | ICD-10-CM

## 2021-12-23 DIAGNOSIS — R17 Unspecified jaundice: Secondary | ICD-10-CM

## 2021-12-23 MED ORDER — BUSPIRONE HCL 10 MG PO TABS
15.0000 mg | ORAL_TABLET | Freq: Two times a day (BID) | ORAL | 0 refills | Status: DC
  Filled 2021-12-23: qty 60, 20d supply, fill #0

## 2021-12-23 NOTE — Progress Notes (Signed)
Pt also stated that when excising she does leak urine, unable to hold bladder.  ?

## 2021-12-23 NOTE — Progress Notes (Signed)
? ?Assessment & Plan:  ?Amber Travis was seen today for ear pain and medication refill. ? ?Diagnoses and all orders for this visit: ? ?Stress incontinence ?-     Urinalysis, Complete ?-     Microscopic Examination ? ?Anxiety and depression ?-     busPIRone (BUSPAR) 10 MG tablet; Take 1.5 tablets (15 mg total) by mouth 2 (two) times daily. ? ?Need for hepatitis C screening test ?-     HCV Ab w Reflex to Quant PCR ? ?Other fatigue ?-     CBC ? ?Elevated bilirubin ?-     CMP14+EGFR ? ?Fatty liver disease, nonalcoholic ?-     Lipid panel ? ? ? ?Patient has been counseled on age-appropriate routine health concerns for screening and prevention. These are reviewed and up-to-date. Referrals have been placed accordingly. Immunizations are up-to-date or declined.    ?Subjective:  ? ?Chief Complaint  ?Patient presents with  ? Ear Pain  ? Medication Refill  ? ?HPI ?Amber Travis 41 y.o. female presents to office today for right ear pain. Feels like something is crawling in her ear. She has been using ear drops in her ear without relief but can not recall the name of them.  ? ?Urinary Incontinence: Patient complains of urinary incontinence. This has been present for several weeks. She leaks urine with exercise. Patient describes the symptoms as  urine leakage with coughing/heavy physical activity and urine leaking unpredictably.  Factors associated with symptoms include anxiety/emotional stress/obesity. Evaluation to date includes none.Treatment to date includes none.  Sates she is using using a sanitary pad every day for urine leakage.  ? ? ?Anxiety and Depression  ?She endorses increased symptoms of anxiety. Will try buspar today. States her anxiety causes her to stress eat and she initially requested a refill of phentermine which was prescribed by another prescriber. I instructed her that I do not prescribe phentermine but would be glad to provide an anxiolytic that would possibly help relieve stress to help keep her  from overeating.  ?Review of Systems  ?Constitutional:  Negative for fever, malaise/fatigue and weight loss.  ?HENT: Negative.  Negative for nosebleeds.   ?Eyes: Negative.  Negative for blurred vision, double vision and photophobia.  ?Respiratory: Negative.  Negative for cough and shortness of breath.   ?Cardiovascular: Negative.  Negative for chest pain, palpitations and leg swelling.  ?Gastrointestinal: Negative.  Negative for heartburn, nausea and vomiting.  ?Genitourinary:  Negative for dysuria, flank pain, frequency, hematuria and urgency.  ?     Stress incontinence  ?Musculoskeletal: Negative.  Negative for myalgias.  ?Neurological: Negative.  Negative for dizziness, focal weakness, seizures and headaches.  ?Psychiatric/Behavioral:  Positive for depression. Negative for suicidal ideas. The patient is nervous/anxious.   ? ?Past Medical History:  ?Diagnosis Date  ? Carpal tunnel syndrome, right 06/2018  ? Depression   ? ? ?Past Surgical History:  ?Procedure Laterality Date  ? CARPAL TUNNEL RELEASE Right 06/28/2018  ? Procedure: RIGHT CARPAL TUNNEL RELEASE;  Surgeon: Leandrew Koyanagi, MD;  Location: [facility];  Service: Orthopedics;  Laterality: Right;  ? LAPAROSCOPIC APPENDECTOMY N/A 05/10/2016  ? Procedure: APPENDECTOMY LAPAROSCOPIC;  Surgeon: Judeth Horn, MD;  Location: [facility];  Service: General;  Laterality: N/A;  ? ? ?Family History  ?Problem Relation Age of Onset  ? Diabetes Mother   ? ? ?Social History Reviewed with no changes to be made today.  ? ?Outpatient Medications Prior to Visit  ?Medication Sig Dispense Refill  ?  methocarbamol (ROBAXIN) 500 MG tablet Take 2 tablets (1,000 mg total) by mouth every 8 (eight) hours as needed for muscle spasms. 90 tablet 0  ? diclofenac (VOLTAREN) 75 MG EC tablet Take 1 tablet (75 mg total) by mouth 2 (two) times daily. (Patient not taking: Reported on 12/23/2021) 30 tablet 2  ? hydrOXYzine (ATARAX) 25 MG tablet Take 1 tablet (25 mg total) by mouth at bedtime.  (Patient not taking: Reported on 12/23/2021) 90 tablet 3  ? meloxicam (MOBIC) 15 MG tablet Take 1 tablet (15 mg total) by mouth daily as needed for pain (Patient not taking: Reported on 12/23/2021) 30 tablet 0  ? omeprazole (PRILOSEC) 20 MG capsule TAKE 1 CAPSULE (20 MG TOTAL) BY MOUTH 2 (TWO) TIMES DAILY BEFORE A MEAL. X 1 MONTH THEN ONE DAILY (Patient not taking: Reported on 04/22/2021) 30 capsule 3  ? phentermine (ADIPEX-P) 37.5 MG tablet Take 37.5 mg by mouth daily. (Patient not taking: Reported on 12/23/2021)    ? ?No facility-administered medications prior to visit.  ? ? ?No Known Allergies ? ?   ?Objective:  ?  ?BP 121/76   Pulse (!) 55   Wt 157 lb 9.6 oz (71.5 kg)   SpO2 97%   BMI 32.94 kg/m?  ?Wt Readings from Last 3 Encounters:  ?12/23/21 157 lb 9.6 oz (71.5 kg)  ?08/13/21 146 lb 1.6 oz (66.3 kg)  ?07/29/21 152 lb 3.2 oz (69 kg)  ? ? ?Physical Exam ?Vitals and nursing note reviewed.  ?Constitutional:   ?   Appearance: She is well-developed.  ?HENT:  ?   Head: Normocephalic and atraumatic.  ?Cardiovascular:  ?   Rate and Rhythm: Regular rhythm. Bradycardia present.  ?   Heart sounds: Normal heart sounds. No murmur heard. ?  No friction rub. No gallop.  ?Pulmonary:  ?   Effort: Pulmonary effort is normal. No tachypnea or respiratory distress.  ?   Breath sounds: Normal breath sounds. No decreased breath sounds, wheezing, rhonchi or rales.  ?Chest:  ?   Chest wall: No tenderness.  ?Abdominal:  ?   General: Bowel sounds are normal.  ?   Palpations: Abdomen is soft.  ?Musculoskeletal:     ?   General: Normal range of motion.  ?   Cervical back: Normal range of motion.  ?Skin: ?   General: Skin is warm and dry.  ?Neurological:  ?   Mental Status: She is alert and oriented to person, place, and time.  ?   Coordination: Coordination normal.  ?Psychiatric:     ?   Behavior: Behavior normal. Behavior is cooperative.     ?   Thought Content: Thought content normal.     ?   Judgment: Judgment normal.  ? ? ? ? ?    ?Patient has been counseled extensively about nutrition and exercise as well as the importance of adherence with medications and regular follow-up. The patient was given clear instructions to go to ER or return to medical center if symptoms don't improve, worsen or new problems develop. The patient verbalized understanding.  ? ?Follow-up: Return in about 4 weeks (around 01/20/2022) for virtual tuesday for urinary incontinence/anxiety.  ? ?Gildardo Pounds, FNP-BC ?[facility] ?[place], [place] ?[phone]   ?12/24/2021, 6:26 PM ?

## 2021-12-24 ENCOUNTER — Encounter: Payer: Self-pay | Admitting: Nurse Practitioner

## 2021-12-24 LAB — MICROSCOPIC EXAMINATION
Bacteria, UA: NONE SEEN
Casts: NONE SEEN /lpf
WBC, UA: NONE SEEN /hpf (ref 0–5)

## 2021-12-24 LAB — URINALYSIS, COMPLETE
Bilirubin, UA: NEGATIVE
Glucose, UA: NEGATIVE
Ketones, UA: NEGATIVE
Leukocytes,UA: NEGATIVE
Nitrite, UA: NEGATIVE
Protein,UA: NEGATIVE
RBC, UA: NEGATIVE
Specific Gravity, UA: 1.017 (ref 1.005–1.030)
Urobilinogen, Ur: 0.2 mg/dL (ref 0.2–1.0)
pH, UA: 5.5 (ref 5.0–7.5)

## 2022-01-21 ENCOUNTER — Other Ambulatory Visit: Payer: Self-pay | Admitting: Nurse Practitioner

## 2022-01-21 DIAGNOSIS — F419 Anxiety disorder, unspecified: Secondary | ICD-10-CM

## 2022-01-22 ENCOUNTER — Other Ambulatory Visit: Payer: Self-pay

## 2022-01-22 MED ORDER — BUSPIRONE HCL 10 MG PO TABS
15.0000 mg | ORAL_TABLET | Freq: Two times a day (BID) | ORAL | 0 refills | Status: AC
  Filled 2022-01-22: qty 60, 20d supply, fill #0

## 2022-01-22 NOTE — Telephone Encounter (Signed)
Requested Prescriptions  Pending Prescriptions Disp Refills  . busPIRone (BUSPAR) 10 MG tablet 60 tablet 0    Sig: Take 1.5 tablets (15 mg total) by mouth 2 (two) times daily.     Psychiatry: Anxiolytics/Hypnotics - Non-controlled Passed - 01/21/2022  5:56 PM      Passed - Valid encounter within last 12 months    Recent Outpatient Visits          1 month ago Stress incontinence   [facility] [place], Shea Stakes, NP   4 months ago Other fatigue   [facility] [place], [place], [place]   7 months ago Upper respiratory tract infection, unspecified type   [facility], Gildardo Pounds, NP   9 months ago Bilateral elbow joint pain   [facility] [place], [place], [place]   1 year ago Stomach burning   [facility] [place], Marzella Schlein, [place]      Future Appointments            In 4 days Claiborne Rigg, NP [facility]   In 4 weeks Claiborne Rigg, NP [facility]

## 2022-01-26 ENCOUNTER — Other Ambulatory Visit: Payer: Self-pay

## 2022-01-26 ENCOUNTER — Ambulatory Visit (HOSPITAL_BASED_OUTPATIENT_CLINIC_OR_DEPARTMENT_OTHER): Payer: Self-pay | Admitting: Nurse Practitioner

## 2022-01-26 ENCOUNTER — Encounter: Payer: Self-pay | Admitting: Nurse Practitioner

## 2022-01-26 DIAGNOSIS — R0981 Nasal congestion: Secondary | ICD-10-CM

## 2022-01-26 DIAGNOSIS — N393 Stress incontinence (female) (male): Secondary | ICD-10-CM

## 2022-01-26 DIAGNOSIS — F341 Dysthymic disorder: Secondary | ICD-10-CM

## 2022-01-26 MED ORDER — AZELASTINE HCL 0.1 % NA SOLN
2.0000 | Freq: Two times a day (BID) | NASAL | 12 refills | Status: DC
Start: 2022-01-26 — End: 2023-03-30
  Filled 2022-01-26: qty 30, 30d supply, fill #0
  Filled 2022-04-26: qty 30, 30d supply, fill #1

## 2022-01-26 MED ORDER — CITALOPRAM HYDROBROMIDE 10 MG PO TABS
10.0000 mg | ORAL_TABLET | Freq: Every day | ORAL | 3 refills | Status: DC
  Filled 2022-01-26: qty 30, 30d supply, fill #0
  Filled 2022-04-13 – 2022-04-26 (×2): qty 30, 30d supply, fill #1

## 2022-01-26 MED ORDER — LORATADINE 10 MG PO TABS
10.0000 mg | ORAL_TABLET | Freq: Every day | ORAL | 11 refills | Status: DC
  Filled 2022-01-26: qty 30, 30d supply, fill #0

## 2022-01-26 NOTE — Progress Notes (Signed)
Virtual Visit via Telephone Note  I discussed the limitations, risks, security and privacy concerns of performing an evaluation and management service by telephone and the availability of in person appointments. I also discussed with the patient that there may be a patient responsible charge related to this service. The patient expressed understanding and agreed to proceed.    I connected with Amber Travis on 01/26/22  at   8:50 AM EDT  EDT by telephone and verified that I am speaking with the correct person using two identifiers.  Location of Patient: Private Residence   [facility] of Provider: [facility]    Persons participating in Telemedicine visit: Bertram Denver FNP-BC Dorise E Junius Finner  Spanish interpreter Elita Quick [phone]   History of Present Illness: Telemedicine visit for: Stress incontinence requesting referral  [phone] She has a several year history of stress incontinence.  She leaks urine with bending, standing, exercise. Patient describes the symptoms as  urine leakage with coughing/heavy physical activity and urine leaking unpredictably.  Factors associated with symptoms include none known. Evaluation to date includes none. She has a history of AUB. Reports spotting in between menstrual cycles.  UA normal 12-23-2021   Anxiety and depression She endorses undesirable side effects with taking buspar (dizziness with driving) and hydroxyzine. Feels her anxiety is not controlled. States her anxiety causes her to stress eat and she initially requested a refill of phentermine which was prescribed by another prescriber. I instructed her that I do not prescribe phentermine but would be glad to provide an alternative medication that would possibly help relieve stress and anxiety to help keep her from overeating.   Nasal congestion Symptoms unrelieved with flonase. Endorses significant nasal congestion and sinus pressure. She is currently  not taking any antihistamine.  Past Medical History:  Diagnosis Date   Carpal tunnel syndrome, right 06/2018   Depression     Past Surgical History:  Procedure Laterality Date   CARPAL TUNNEL RELEASE Right 06/28/2018   Procedure: RIGHT CARPAL TUNNEL RELEASE;  Surgeon: Tarry Kos, MD;  Location: [facility];  Service: Orthopedics;  Laterality: Right;   LAPAROSCOPIC APPENDECTOMY N/A 05/10/2016   Procedure: APPENDECTOMY LAPAROSCOPIC;  Surgeon: Jimmye Norman, MD;  [facility];  Service: General;  Laterality: N/A;    Family History  Problem Relation Age of Onset   Diabetes Mother     Social History   Socioeconomic History   Marital status: Single    Spouse name: Not on file   Number of children: 4   Years of education: Not on file   Highest education level: 9th grade  Occupational History   Not on file  Tobacco Use   Smoking status: Never   Smokeless tobacco: Never  Vaping Use   Vaping Use: Never used  Substance and Sexual Activity   Alcohol use: No   Drug use: No   Sexual activity: Yes    Birth control/protection: I.U.D.  Other Topics Concern   Not on file  Social History Narrative   Patient is right-handed. She lives with her children in a one level home. She drinks one cup of coffee a day. She has not been exercising lately. Speaks spanish .   Social Determinants of Health   Financial Resource Strain: Not on file  Food Insecurity: No Food Insecurity   Worried About Programme researcher, broadcasting/film/video in the Last Year: Never true   Ran Out of Food in the Last Year: Never true  Transportation Needs: No Regulatory affairs officer (Medical): No   Lack of Transportation (Non-Medical): No  Physical Activity: Not on file  Stress: Not on file  Social Connections: Not on file     Observations/Objective: Awake, alert and oriented x 3   Review of Systems  Constitutional:  Negative for fever, malaise/fatigue and weight loss.  HENT:  Positive for  congestion. Negative for nosebleeds.   Eyes: Negative.  Negative for blurred vision, double vision and photophobia.  Respiratory: Negative.  Negative for cough and shortness of breath.   Cardiovascular: Negative.  Negative for chest pain, palpitations and leg swelling.  Gastrointestinal: Negative.  Negative for heartburn, nausea and vomiting.  Genitourinary:        SEE HPI  Musculoskeletal: Negative.  Negative for myalgias.  Neurological: Negative.  Negative for dizziness, focal weakness, seizures and headaches.  Psychiatric/Behavioral: Negative.  Negative for suicidal ideas.    Assessment and Plan:  Diagnoses and all orders for this visit:  Stress incontinence of urine -     Ambulatory referral to Physical Therapy  ANXIETY DEPRESSION Stop buspar and hydroxyzine due to side effects -     citalopram (CELEXA) 10 MG tablet; Take 1 tablet (10 mg total) by mouth daily.  Nasal congestion -     loratadine (CLARITIN) 10 MG tablet; Take 1 tablet (10 mg total) by mouth daily. -     azelastine (ASTELIN) 0.1 % nasal spray; Place 2 sprays into both nostrils 2 (two) times daily. Use in each nostril as directed     Follow Up Instructions Return if symptoms worsen or fail to improve.     I discussed the assessment and treatment plan with the patient. The patient was provided an opportunity to ask questions and all were answered. The patient agreed with the plan and demonstrated an understanding of the instructions.   The patient was advised to call back or seek an in-person evaluation if the symptoms worsen or if the condition fails to improve as anticipated.  I provided 12 minutes of non-face-to-face time during this encounter including median intraservice time, reviewing previous notes, labs, imaging, medications and explaining diagnosis and management.  Claiborne Rigg, FNP-BC

## 2022-02-04 ENCOUNTER — Ambulatory Visit: Payer: Self-pay | Admitting: *Deleted

## 2022-02-04 ENCOUNTER — Encounter: Payer: Self-pay | Admitting: Physical Therapy

## 2022-02-04 ENCOUNTER — Ambulatory Visit: Payer: Self-pay | Attending: Nurse Practitioner | Admitting: Physical Therapy

## 2022-02-04 DIAGNOSIS — N393 Stress incontinence (female) (male): Secondary | ICD-10-CM | POA: Insufficient documentation

## 2022-02-04 DIAGNOSIS — R279 Unspecified lack of coordination: Secondary | ICD-10-CM | POA: Insufficient documentation

## 2022-02-04 DIAGNOSIS — M6281 Muscle weakness (generalized): Secondary | ICD-10-CM | POA: Insufficient documentation

## 2022-02-04 DIAGNOSIS — R293 Abnormal posture: Secondary | ICD-10-CM | POA: Insufficient documentation

## 2022-02-04 NOTE — Therapy (Signed)
OUTPATIENT PHYSICAL THERAPY FEMALE PELVIC EVALUATION   Patient Name: Amber Travis MRN: [id number] DOB:12/31/1980, 41 y.o., female Today's Date: 02/04/2022   PT End of Session - 02/04/22 0845     Visit Number 1    Date for PT Re-Evaluation 05/07/22    Authorization Type cone financial    PT Start Time 0802    PT Stop Time 0844    PT Time Calculation (min) 42 min    Activity Tolerance Patient tolerated treatment well    Behavior During Therapy [facility] for tasks assessed/performed             Past Medical History:  Diagnosis Date   Carpal tunnel syndrome, right 06/2018   Depression    Past Surgical History:  Procedure Laterality Date   CARPAL TUNNEL RELEASE Right 06/28/2018   Procedure: RIGHT CARPAL TUNNEL RELEASE;  Surgeon: Tarry Kos, MD;  Location: [facility];  Service: Orthopedics;  Laterality: Right;   LAPAROSCOPIC APPENDECTOMY N/A 05/10/2016   Procedure: APPENDECTOMY LAPAROSCOPIC;  Surgeon: Jimmye Norman, MD;  Location: [facility] OR;  Service: General;  Laterality: N/A;   Patient Active Problem List   Diagnosis Date Noted   Stress incontinence of urine 09/14/2019   Language barrier 09/03/2019   Breakthrough bleeding on depo provera 09/03/2019   Morbid obesity with BMI of 40.0-44.9, adult (HCC) 03/06/2019   S/P carpal tunnel release 08/10/2018   Right carpal tunnel syndrome 06/28/2018   Pelvic pain 06/13/2017   Herpes simplex type 1 antibody positive 08/10/2016   Breast lump in female 02/04/2015   DENTAL CARIES 10/07/2010   HYPERBILIRUBINEMIA 07/09/2010   SKIN RASH 05/14/2010   ANXIETY DEPRESSION 05/04/2010   FIBROCYSTIC BREAST DISEASE 05/04/2010   UNSPECIFIED ADJUSTMENT REACTION 04/17/2009   MIGRAINE HEADACHE 01/27/2009   Depression 06/11/2008    PCP: Claiborne Rigg, NP  REFERRING PROVIDER: Claiborne Rigg, NP  REFERRING DIAG: N39.3 (ICD-10-CM) - Stress incontinence of urine  THERAPY DIAG:  Muscle weakness  (generalized)  Unspecified lack of coordination  Abnormal posture  Rationale for Evaluation and Treatment Rehabilitation  ONSET DATE: 2 months  SUBJECTIVE:                                                                                                                                                                                           SUBJECTIVE STATEMENT: Pt reports she has a small amount of leakage with jumping, laughing, sneezing, coughing Fluid intake: Yes: 5 bottles, coffee/tea sometimes    Patient confirms identification and approves PT to assess pelvic floor and treatment Yes   PAIN:  Are you having pain? No  PRECAUTIONS: None  WEIGHT BEARING RESTRICTIONS No  FALLS:  Has patient fallen in last 6 months? No  LIVING ENVIRONMENT: Lives with: lives with their family Lives in: House/apartment  OCCUPATION: cleaning house  PLOF: Independent  PATIENT GOALS to have less leakage  PERTINENT HISTORY:  [id number],  anxiety  Sexual abuse: No  BOWEL MOVEMENT Pain with bowel movement: No Type of bowel movement:Type (Bristol Stool Scale) 4, Frequency usually daily but right now feels constipated , and Strain Yes Fully empty rectum: Yes: sometimes no Leakage: No Pads: No Fiber supplement: Yes: not usually but has taken laxatives with constipation    URINATION Pain with urination: No Fully empty bladder: Yes:   Stream: Strong Urgency: No Frequency: about every 30 mins; 2x per night Leakage: Coughing, Sneezing, Laughing, Exercise, Lifting, and Bending forward Pads: Yes: 1-2x changes  INTERCOURSE Pain with intercourse: Initial Penetration, During Penetration, and After Intercourse Ability to have vaginal penetration:  Yes: burning throughout and after intercourse Climax: not painful Marinoff Scale: 1/3 No dryness   PREGNANCY Vaginal deliveries 4 Tearing No C-section deliveries 0 Currently pregnant No  PROLAPSE Reports constant bulge feeling      OBJECTIVE:   DIAGNOSTIC FINDINGS:     COGNITION:  Overall cognitive status: Within functional limits for tasks assessed     SENSATION:  Light touch: Appears intact  Proprioception: Appears intact  MUSCLE LENGTH: Bil hamstrings and adductor limited by 25%   POSTURE:  Rounded shoulders, posterior pelvic tilt  LUMBARAROM/PROM  A/PROM A/PROM  eval  Flexion Limited by 25%  Extension WFL  Right lateral flexion Limited by 25%  Left lateral flexion Limited by 25%  Right rotation WFL  Left rotation WFL   (Blank rows = not tested)  LOWER EXTREMITY ROM:  WFL bil   LOWER EXTREMITY MMT:  Bil hips grossly 4/5  PELVIC MMT:   MMT eval  Vaginal 4/5; 6s isometric; 8 reps. But demonstrated difficulty relaxing fully between reps  Internal Anal Sphincter   External Anal Sphincter   Puborectalis   Diastasis Recti   (Blank rows = not tested)        PALPATION:   General  no TTP, mild fascial restrictions throughout abdomen                 External Perineal Exam no TTP                             Internal Pelvic Floor no TTP, slightly increased tone  TONE: Slightly increased   PROLAPSE: Possible grade 1-2 anterior laxity with several strong coughs in hooklying. However with pelvic floor activation then cough, no more than grade one based on tissue descent   TODAY'S TREATMENT  02/04/2022 EVAL Examination completed, findings reviewed, pt educated on POC, HEP. Pt motivated to participate in PT and agreeable to attempt recommendations.     PATIENT EDUCATION:  Education details: [id number] Person educated: Patient Education method: Explanation, Demonstration, Actor cues, Verbal cues, and Handouts Education comprehension: verbalized understanding and returned demonstration   HOME EXERCISE PROGRAM: [id number]  ASSESSMENT:  CLINICAL IMPRESSION: Patient is a 41 y.o. female  who was seen today for physical therapy evaluation and treatment for urinary incontinence. Pt  does require interpreter present, and utilized throughout evaluation and treatment. Pt found to have urinary incontinence with stressors, does have increased frequency of urine sometimes every 30 mins. Pt  also has constipation intermittently. Pt found to have fascial restrictions at abdomen,  decreased spinal and bil hip flexibility, and decreased bil hip strength. Pt consented to internal vaginal assessment this date and found to have decreased strength, coordination, and endurance.  Pt does need extra time to relax fully after contractions and did demonstrate mildly increased tone throughout pelvic floor but without pain. Pt able to generate contraction but needed cues to relax. Pt would benefit from additional PT to further address deficits.       OBJECTIVE IMPAIRMENTS decreased coordination, decreased endurance, decreased strength, increased fascial restrictions, impaired flexibility, improper body mechanics, and postural dysfunction.   ACTIVITY LIMITATIONS lifting, bending, squatting, and continence  PARTICIPATION LIMITATIONS: laundry, community activity, and yard work  PERSONAL FACTORS Fitness, Time since onset of injury/illness/exacerbation, and 3+ comorbidities: x4 vaginal births, anxiety  are also affecting patient's functional outcome.   REHAB POTENTIAL: Good  CLINICAL DECISION MAKING: Stable/uncomplicated  EVALUATION COMPLEXITY: Low   GOALS: Goals reviewed with patient? Yes  SHORT TERM GOALS: Target date: 03/04/2022  Pt to be I with HEP.  Baseline: Goal status: INITIAL  2.  Pt to report improved time between bladder voids to at least 45 mins for improved QOL with decreased urinary frequency.   Baseline:  Goal status: INITIAL  3.  Pt to be I with recall of voiding mechanics and breathing mechanics to decrease straining for bowel movements.  Baseline:  Goal status: INITIAL  4.  Pt to be able to demonstrate improved pelvic floor relaxation between every pelvic floor  contraction fully for improved pelvic floor mechanics and coordination.  Baseline:  Goal status: INITIAL    LONG TERM GOALS: Target date: 05/07/2022   Pt to be I with advanced HEP.  Baseline:  Goal status: INITIAL  2.  Pt to demonstrate at least 5/5 bil hip strength for improved pelvic stability and functional squats without leakage.  Baseline:  Goal status: INITIAL  3.  Pt to report improved time between bladder voids to at least 2 hours for improved QOL with decreased urinary frequency.   Baseline:  Goal status: INITIAL  4.  Pt to demonstrate at least 4/5 pelvic floor strength and ability to hold contraction for at least 8 seconds without valsalva and to fully relax post contraction for improved pelvic stability and decreased strain at pelvic floor/ decrease leakage.  Baseline:  Goal status: INITIAL   PLAN: PT FREQUENCY:  every other week  PT DURATION:  8 sessions  PLANNED INTERVENTIONS: Therapeutic exercises, Therapeutic activity, Neuromuscular re-education, Patient/Family education, Joint mobilization, Dry Needling, Spinal mobilization, Cryotherapy, Moist heat, Manual lymph drainage, scar mobilization, Taping, Biofeedback, and Manual therapy  PLAN FOR NEXT SESSION: review breathing and voiding mechanics, urge drill, coordination of pelvic floor and breathing mechanics   Otelia SergeantHaley Corda Shutt, PT, DPT 02/04/2309:05 AM

## 2022-02-04 NOTE — Telephone Encounter (Signed)
Attempted to reach pt. Please see encounter and advise.

## 2022-02-04 NOTE — Telephone Encounter (Signed)
Pt stated the Rx for busPIRone (BUSPAR) 10 MG tablet is too much because it causes her to be sleepy. Pt stated she did not take the medication today because she is still sleepy from the dosage she took yesterday. Pt requests call back asap. Cb# [phone]

## 2022-02-05 NOTE — Telephone Encounter (Signed)
Called pt made aware . Verbalized understanding and agreement

## 2022-02-05 NOTE — Telephone Encounter (Signed)
Take half a tablet

## 2022-02-19 ENCOUNTER — Ambulatory Visit: Payer: Self-pay | Admitting: Nurse Practitioner

## 2022-02-19 ENCOUNTER — Ambulatory Visit: Payer: Self-pay | Admitting: Physical Therapy

## 2022-03-08 ENCOUNTER — Ambulatory Visit: Payer: Self-pay | Admitting: Physical Therapy

## 2022-03-22 ENCOUNTER — Ambulatory Visit: Payer: Self-pay | Attending: Nurse Practitioner | Admitting: Physical Therapy

## 2022-03-22 DIAGNOSIS — M6281 Muscle weakness (generalized): Secondary | ICD-10-CM | POA: Insufficient documentation

## 2022-03-22 DIAGNOSIS — R293 Abnormal posture: Secondary | ICD-10-CM | POA: Insufficient documentation

## 2022-03-22 DIAGNOSIS — R279 Unspecified lack of coordination: Secondary | ICD-10-CM | POA: Insufficient documentation

## 2022-03-22 NOTE — Therapy (Addendum)
OUTPATIENT PHYSICAL THERAPY FEMALE PELVIC TREATMENT   Patient Name: Amber Travis MRN: [id number] DOB:[birth year], 41 y.o., female Today's Date: 03/22/2022   PT End of Session - 03/22/22 0932     Visit Number 2    Date for PT Re-Evaluation 05/07/22    Authorization Type cone financial - awaiting approval, until then self pay    PT Start Time 0930    PT Stop Time 1009    PT Time Calculation (min) 39 min    Activity Tolerance Patient tolerated treatment well    Behavior During Therapy [facility] for tasks assessed/performed             Past Medical History:  Diagnosis Date   Carpal tunnel syndrome, right 06/2018   Depression    Past Surgical History:  Procedure Laterality Date   CARPAL TUNNEL RELEASE Right 06/28/2018   Procedure: RIGHT CARPAL TUNNEL RELEASE;  Surgeon: Amber Koyanagi, MD;  Location: [place];  Service: Orthopedics;  Laterality: Right;   LAPAROSCOPIC APPENDECTOMY N/A 05/10/2016   Procedure: APPENDECTOMY LAPAROSCOPIC;  Surgeon: Amber Horn, MD;  Location: [place];  Service: General;  Laterality: N/A;   Patient Active Problem List   Diagnosis Date Noted   Stress incontinence of urine 09/14/2019   Language barrier 09/03/2019   Breakthrough bleeding on depo provera 09/03/2019   Morbid obesity with BMI of 40.0-44.9, adult ([place]) 03/06/2019   S/P carpal tunnel release 08/10/2018   Right carpal tunnel syndrome 06/28/2018   Pelvic pain 06/13/2017   Herpes simplex type 1 antibody positive 08/10/2016   Breast lump in female 02/04/2015   DENTAL CARIES 10/07/2010   HYPERBILIRUBINEMIA 07/09/2010   SKIN RASH 05/14/2010   ANXIETY DEPRESSION 05/04/2010   FIBROCYSTIC BREAST DISEASE 05/04/2010   UNSPECIFIED ADJUSTMENT REACTION 04/17/2009   MIGRAINE HEADACHE 01/27/2009   Depression 06/11/2008    PCP: Amber Pounds, NP  REFERRING PROVIDER: Gildardo Pounds, NP  REFERRING DIAG: N39.3 (ICD-10-CM) - Stress incontinence of urine  THERAPY DIAG:   Muscle weakness (generalized)  Unspecified lack of coordination  Abnormal posture  Rationale for Evaluation and Treatment Rehabilitation  ONSET DATE: 2 months  SUBJECTIVE:                                                                                                                                                                                           SUBJECTIVE STATEMENT: Pt reports she has been doing HEP most of the time but sometimes forgets. Pt still has leakage however.   Fluid intake: Yes: 5 bottles, coffee/tea sometimes    Patient confirms identification and approves PT to assess pelvic floor  and treatment Yes   PAIN:  Are you having pain? No   PRECAUTIONS: None  WEIGHT BEARING RESTRICTIONS No  FALLS:  Has patient fallen in last 6 months? No  LIVING ENVIRONMENT: Lives with: lives with their family Lives in: House/apartment  OCCUPATION: cleaning house  PLOF: Independent  PATIENT GOALS to have less leakage  PERTINENT HISTORY:  [id number],  anxiety  Sexual abuse: No  BOWEL MOVEMENT Pain with bowel movement: No Type of bowel movement:Type (Bristol Stool Scale) 4, Frequency usually daily but right now feels constipated , and Strain Yes Fully empty rectum: Yes: sometimes no Leakage: No Pads: No Fiber supplement: Yes: not usually but has taken laxatives with constipation    URINATION Pain with urination: No Fully empty bladder: Yes:   Stream: Strong Urgency: No Frequency: about every 30 mins; 2x per night Leakage: Coughing, Sneezing, Laughing, Exercise, Lifting, and Bending forward Pads: Yes: 1-2x changes  INTERCOURSE Pain with intercourse: Initial Penetration, During Penetration, and After Intercourse Ability to have vaginal penetration:  Yes: burning throughout and after intercourse Climax: not painful Marinoff Scale: 1/3 No dryness   PREGNANCY Vaginal deliveries 4 Tearing No C-section deliveries 0 Currently pregnant  No  PROLAPSE Reports constant bulge feeling     OBJECTIVE:   DIAGNOSTIC FINDINGS:     COGNITION:  Overall cognitive status: Within functional limits for tasks assessed     SENSATION:  Light touch: Appears intact  Proprioception: Appears intact  MUSCLE LENGTH: Bil hamstrings and adductor limited by 25%   POSTURE:  Rounded shoulders, posterior pelvic tilt  LUMBARAROM/PROM  A/PROM A/PROM  eval  Flexion Limited by 25%  Extension WFL  Right lateral flexion Limited by 25%  Left lateral flexion Limited by 25%  Right rotation WFL  Left rotation WFL   (Blank rows = not tested)  LOWER EXTREMITY ROM:  WFL bil   LOWER EXTREMITY MMT:  Bil hips grossly 4/5  PELVIC MMT:   MMT eval  Vaginal 4/5; 6s isometric; 8 reps. But demonstrated difficulty relaxing fully between reps  Internal Anal Sphincter   External Anal Sphincter   Puborectalis   Diastasis Recti   (Blank rows = not tested)        PALPATION:   General  no TTP, mild fascial restrictions throughout abdomen                 External Perineal Exam no TTP                             Internal Pelvic Floor no TTP, slightly increased tone  TONE: Slightly increased   PROLAPSE: Possible grade 1-2 anterior laxity with several strong coughs in hooklying. However with pelvic floor activation then cough, no more than grade one based on tissue descent   TODAY'S TREATMENT   03/22/22: [name] with ball squeeze 2x10 Opp hand/knee ball press 2x10  Sidelying hip abduction with ball press 2x10 each Bird dogs x10 10# Sit to stand from mat table Mario punches 5# x10 each Palloffs 2x10 red band   PATIENT EDUCATION:  Education details: [id number] Person educated: Patient Education method: Consulting civil engineer, Demonstration, Tactile cues, Verbal cues, and Handouts Education comprehension: verbalized understanding and returned demonstration   HOME EXERCISE PROGRAM: [id number]  ASSESSMENT:  CLINICAL IMPRESSION: Patient  reports she does continue to have leakage since eval. Pt session focused on hip and core strengthening all coordinated with pelvic floor and breathing mechanics for decreased leakage with activity/stressors. Pt benefited  from cues for breathing mechanics and to fully allow mobility of abdomen with inhale and decreased speed of exercises to decrease held contractions. Pt would benefit from additional PT to further address deficits.       OBJECTIVE IMPAIRMENTS decreased coordination, decreased endurance, decreased strength, increased fascial restrictions, impaired flexibility, improper body mechanics, and postural dysfunction.   ACTIVITY LIMITATIONS lifting, bending, squatting, and continence  PARTICIPATION LIMITATIONS: laundry, community activity, and yard work  PERSONAL FACTORS Fitness, Time since onset of injury/illness/exacerbation, and 3+ comorbidities: x4 vaginal births, anxiety  are also affecting patient's functional outcome.   REHAB POTENTIAL: Good  CLINICAL DECISION MAKING: Stable/uncomplicated  EVALUATION COMPLEXITY: Low   GOALS: Goals reviewed with patient? Yes  SHORT TERM GOALS: Target date: 03/04/2022  Pt to be I with HEP.  Baseline: Goal status: INITIAL  2.  Pt to report improved time between bladder voids to at least 45 mins for improved QOL with decreased urinary frequency.   Baseline:  Goal status: INITIAL  3.  Pt to be I with recall of voiding mechanics and breathing mechanics to decrease straining for bowel movements.  Baseline:  Goal status: INITIAL  4.  Pt to be able to demonstrate improved pelvic floor relaxation between every pelvic floor contraction fully for improved pelvic floor mechanics and coordination.  Baseline:  Goal status: INITIAL    LONG TERM GOALS: Target date: 05/07/2022   Pt to be I with advanced HEP.  Baseline:  Goal status: INITIAL  2.  Pt to demonstrate at least 5/5 bil hip strength for improved pelvic stability and functional squats  without leakage.  Baseline:  Goal status: INITIAL  3.  Pt to report improved time between bladder voids to at least 2 hours for improved QOL with decreased urinary frequency.   Baseline:  Goal status: INITIAL  4.  Pt to demonstrate at least 4/5 pelvic floor strength and ability to hold contraction for at least 8 seconds without valsalva and to fully relax post contraction for improved pelvic stability and decreased strain at pelvic floor/ decrease leakage.  Baseline:  Goal status: INITIAL   PLAN: PT FREQUENCY:  every other week  PT DURATION:  8 sessions  PLANNED INTERVENTIONS: Therapeutic exercises, Therapeutic activity, Neuromuscular re-education, Patient/Family education, Joint mobilization, Dry Needling, Spinal mobilization, Cryotherapy, Moist heat, Manual lymph drainage, scar mobilization, Taping, Biofeedback, and Manual therapy  PLAN FOR NEXT SESSION: review breathing and voiding mechanics, urge drill, coordination of pelvic floor and breathing mechanics   Stacy Gardner, PT, DPT 03/22/2309:12 AM  PHYSICAL THERAPY DISCHARGE SUMMARY  Visits from Start of Care: 2  Current functional level related to goals / functional outcomes: Unable to formally reassess as pt has not returned since last visit   Remaining deficits: Unable to formally reassess    Education / Equipment: HEP   Patient agrees to discharge. Patient goals were not met. Patient is being discharged due to not returning since the last visit.   Stacy Gardner, PT, DPT 08/31/238:42 AM

## 2022-04-05 ENCOUNTER — Ambulatory Visit: Payer: Self-pay | Admitting: Physical Therapy

## 2022-04-13 ENCOUNTER — Other Ambulatory Visit: Payer: Self-pay

## 2022-04-13 ENCOUNTER — Ambulatory Visit: Payer: Self-pay | Admitting: *Deleted

## 2022-04-13 NOTE — Telephone Encounter (Signed)
  Chief Complaint: numbness and pain in left arm and occasionally left foot numb no painful Symptoms: arm numb when lays down Frequency: sometimes when lays down, mostly in the past 3 days Pertinent Negatives: Patient denies injury, fever Disposition: [] ED /[] Urgent Care (no appt availability in office) / [x] Appointment(In office/virtual)/ []  [place] [facility]/ [] Home Care/ [] Refused Recommended Disposition /[] [place] [facility]/ []  Follow-up with PCP Additional Notes: Pt states that her arm hurts and gets numb like her other arm did before she had surgery for carpal tunnel. It only hurts and gets numb when she is in bed. Discussed pillow positioning.Pt scheduled outside of protocol disposition, first appt. 04/19/22.    Reason for Disposition  [1] Tingling in hand (e.g., pins and needles) AND [2] after prolonged lying on arm AND [3]  brief (now gone)  Answer Assessment - Initial Assessment Questions 1. SYMPTOM: "What is the main symptom you are concerned about?" (e.g., weakness, numbness)     Numbness in arm for 3 days when in bed. 2. ONSET: "When did this start?" (minutes, hours, days; while sleeping)      3 days ago, always when gets up from laying down 3. LAST NORMAL: "When was the last time you (the patient) were normal (no symptoms)?"     Only happens when laying down, now states left foot numb 4. PATTERN "Does this come and go, or has it been constant since it started?"  "Is it present now?"     She is working right now, Only foot numb now, not her arm. 5. CARDIAC SYMPTOMS: "Have you had any of the following symptoms: chest pain, difficulty breathing, palpitations?"     no 6. NEUROLOGIC SYMPTOMS: "Have you had any of the following symptoms: headache, dizziness, vision loss, double vision, changes in speech, unsteady on your feet?"     no 7. OTHER SYMPTOMS: "Do you have any other symptoms?"     no 8. PREGNANCY: "Is there any chance you are pregnant?" "When was your last  menstrual period?"     no  Protocols used: Neurologic Deficit-A-AH

## 2022-04-14 NOTE — Telephone Encounter (Signed)
Noted patient has appointment Monday 04/19/22

## 2022-04-19 ENCOUNTER — Ambulatory Visit: Payer: Self-pay | Admitting: Nurse Practitioner

## 2022-04-19 ENCOUNTER — Telehealth: Payer: Self-pay | Admitting: Physical Therapy

## 2022-04-19 ENCOUNTER — Ambulatory Visit: Payer: Self-pay | Attending: Nurse Practitioner | Admitting: Physical Therapy

## 2022-04-19 DIAGNOSIS — M6281 Muscle weakness (generalized): Secondary | ICD-10-CM | POA: Insufficient documentation

## 2022-04-19 DIAGNOSIS — R279 Unspecified lack of coordination: Secondary | ICD-10-CM | POA: Insufficient documentation

## 2022-04-19 DIAGNOSIS — R293 Abnormal posture: Secondary | ICD-10-CM | POA: Insufficient documentation

## 2022-04-19 NOTE — Telephone Encounter (Signed)
PT called pt about this morning's appointment at 0930. Interpreter used to call and speak to pt, pt has been unable to come due to financials and will call back with additional information once she knows more about this with [employer]. Pt would like to cancel future appointments until she calls to reschedule.   Otelia Sergeant, PT, DPT 08/14/239:50 AM

## 2022-04-20 ENCOUNTER — Other Ambulatory Visit: Payer: Self-pay

## 2022-04-26 ENCOUNTER — Other Ambulatory Visit: Payer: Self-pay

## 2022-04-28 ENCOUNTER — Ambulatory Visit (INDEPENDENT_AMBULATORY_CARE_PROVIDER_SITE_OTHER): Payer: Self-pay | Admitting: Certified Nurse Midwife

## 2022-04-28 ENCOUNTER — Other Ambulatory Visit (HOSPITAL_COMMUNITY)
Admission: RE | Admit: 2022-04-28 | Discharge: 2022-04-28 | Disposition: A | Discharge status: Home / Self Care | Payer: Self-pay | Source: Ambulatory Visit | Attending: Family Medicine | Admitting: Family Medicine

## 2022-04-28 ENCOUNTER — Encounter: Payer: Self-pay | Admitting: Certified Nurse Midwife

## 2022-04-28 ENCOUNTER — Other Ambulatory Visit: Payer: Self-pay

## 2022-04-28 VITALS — BP 117/77 | HR 77 | Wt 163.0 lb

## 2022-04-28 DIAGNOSIS — N760 Acute vaginitis: Secondary | ICD-10-CM

## 2022-04-28 DIAGNOSIS — Z30431 Encounter for routine checking of intrauterine contraceptive device: Secondary | ICD-10-CM

## 2022-04-28 DIAGNOSIS — R300 Dysuria: Secondary | ICD-10-CM

## 2022-04-28 DIAGNOSIS — N941 Unspecified dyspareunia: Secondary | ICD-10-CM

## 2022-04-28 NOTE — Progress Notes (Signed)
Patient here due to burning during urination along with pain during intercourse. Both started roughly 1 month ago.

## 2022-04-29 ENCOUNTER — Ambulatory Visit: Payer: Self-pay | Admitting: Family Medicine

## 2022-04-29 LAB — CERVICOVAGINAL ANCILLARY ONLY
Bacterial Vaginitis (gardnerella): POSITIVE — AB
Candida Glabrata: NEGATIVE
Candida Vaginitis: POSITIVE — AB
Chlamydia: NEGATIVE
Comment: NEGATIVE
Comment: NEGATIVE
Comment: NEGATIVE
Comment: NEGATIVE
Comment: NEGATIVE
Comment: NORMAL
Neisseria Gonorrhea: NEGATIVE
Trichomonas: NEGATIVE

## 2022-04-30 MED ORDER — METRONIDAZOLE 500 MG PO TABS
500.0000 mg | ORAL_TABLET | Freq: Two times a day (BID) | ORAL | 0 refills | Status: AC
Start: 2022-04-30 — End: 2022-05-12
  Filled 2022-04-30: qty 14, 7d supply, fill #0

## 2022-04-30 MED ORDER — TERCONAZOLE 0.4 % VA CREA
1.0000 | TOPICAL_CREAM | Freq: Every day | VAGINAL | 0 refills | Status: AC
Start: 2022-04-30 — End: 2022-05-12
  Filled 2022-04-30: qty 45, 7d supply, fill #0

## 2022-04-30 NOTE — Progress Notes (Signed)
History:  Ms. Amber Travis is a 41 y.o. [id number] who presents to clinic today for pain with sex, dysuria, and vaginitis. Is wondering if this has to do with her IUD.   The following portions of the patient's history were reviewed and updated as appropriate: allergies, current medications, family history, past medical history, social history, past surgical history and problem list.  Review of Systems:  Pertinent items noted in HPI and remainder of comprehensive ROS otherwise negative.    Objective:  Physical Exam BP 117/77   Pulse 77   Wt 163 lb (73.9 kg)   BMI 34.07 kg/m  Physical Exam Vitals and nursing note reviewed.  Constitutional:      Appearance: Normal appearance.  HENT:     Head: Normocephalic.     Mouth/Throat:     Mouth: Mucous membranes are moist.  Eyes:     Pupils: Pupils are equal, round, and reactive to light.  Cardiovascular:     Rate and Rhythm: Normal rate and regular rhythm.  Pulmonary:     Effort: Pulmonary effort is normal.  Abdominal:     Palpations: Abdomen is soft.  Genitourinary:    General: Normal vulva.     Vagina: Vaginal discharge present.     Musculoskeletal:        General: Normal range of motion.  Skin:    General: Skin is warm and dry.     Capillary Refill: Capillary refill takes less than 2 seconds.  Neurological:     Mental Status: She is alert and oriented to person, place, and time.  Psychiatric:        Mood and Affect: Mood normal.        Behavior: Behavior normal.        Thought Content: Thought content normal.        Judgment: Judgment normal.      Labs and Imaging No results found for this or any previous visit (from the past 24 hour(s)).  No results found.  Assessment & Plan:  1. Surveillance for birth control, intrauterine device - IUD strings present  2. Burning with urination - Urine Culture - Cervicovaginal ancillary only( [place])  3. Acute vaginitis - metroNIDAZOLE (FLAGYL) 500 MG  tablet; Take 1 tablet (500 mg total) by mouth 2 (two) times daily for 7 days. Take after a full meal.  Dispense: 14 tablet; Refill: 0 - terconazole (TERAZOL 7) 0.4 % vaginal cream; Place 1 applicator vaginally at bedtime for 7 days.  Dispense: 45 g; Refill: 0  4. Dyspareunia, female - Cervicovaginal ancillary only( [place]) - if pain does not resolve with BV/yeast treatment, will send for a pelvic ultrasound to check IUD placement.  PRN follow up  Bernerd Limbo, CNM 04/30/2022 8:00 PM

## 2022-05-03 ENCOUNTER — Encounter: Payer: Self-pay | Admitting: Physical Therapy

## 2022-05-03 ENCOUNTER — Telehealth: Payer: Self-pay | Admitting: *Deleted

## 2022-05-03 ENCOUNTER — Other Ambulatory Visit: Payer: Self-pay

## 2022-05-03 LAB — URINE CULTURE

## 2022-05-03 NOTE — Telephone Encounter (Addendum)
-----   Message from Bernerd Limbo, [place] sent at 04/30/2022  7:59 PM EDT ----- Please call to give pt results and confirm she's picked up her prescriptions.   8/28  0945  Called pt w/interpreter Gardiner Ramus. Pt was informed of test results showing vaginal yeast and BV and prescriptions sent to pharmacy. She voiced understanding.

## 2022-05-04 ENCOUNTER — Other Ambulatory Visit: Payer: Self-pay

## 2022-05-05 ENCOUNTER — Other Ambulatory Visit: Payer: Self-pay

## 2022-05-17 ENCOUNTER — Encounter: Payer: Self-pay | Admitting: Physical Therapy

## 2022-05-21 ENCOUNTER — Other Ambulatory Visit: Payer: Self-pay

## 2022-05-21 ENCOUNTER — Other Ambulatory Visit: Payer: Self-pay | Admitting: Nurse Practitioner

## 2022-05-21 DIAGNOSIS — F419 Anxiety disorder, unspecified: Secondary | ICD-10-CM

## 2022-05-24 ENCOUNTER — Other Ambulatory Visit: Payer: Self-pay

## 2022-05-31 ENCOUNTER — Encounter: Payer: Self-pay | Admitting: Physical Therapy

## 2022-09-09 ENCOUNTER — Ambulatory Visit: Payer: Self-pay

## 2022-09-09 NOTE — Telephone Encounter (Signed)
Interpreter   Amber Travis [phone]   Chief Complaint: dizziness Symptoms: dizziness when laying down or standing up slowly Frequency: 3 days  Pertinent Negatives: NA Disposition: [] ED /[] Urgent Care (no appt availability in office) / [] Appointment(In office/virtual)/ []  [facility]/ [] Home Care/ [] Refused Recommended Disposition /[x] [facility]/ []  Follow-up with PCP Additional Notes: pt hasn't had this dizziness before and unsure what causing. No appts until 10/07/22. Offered UC or [facility]. Pt preferred to go to [facility] on 09/13/22.    Summary: Dizziness   Pt called to report that she has been having trouble sleeping because at night when she lays down her head spins and leaves her very dizzy. This has been going on for the past three days.  Best contact: [phone]  Needs spanish speaker         Reason for Disposition  [1] MILD dizziness (e.g., walking normally) AND [2] has NOT been evaluated by doctor (or NP/PA) for this  (Exception: Dizziness caused by heat exposure, sudden standing, or poor fluid intake.)  Answer Assessment - Initial Assessment Questions 1. DESCRIPTION: "Describe your dizziness."     When laying down head starts spinning and dizzy 2. LIGHTHEADED: "Do you feel lightheaded?" (e.g., somewhat faint, woozy, weak upon standing)     yes 3. VERTIGO: "Do you feel like either you or the room is spinning or tilting?" (i.e. vertigo)     yes 4. SEVERITY: "How bad is it?"  "Do you feel like you are going to faint?" "Can you stand and walk?"   - MILD: Feels slightly dizzy, but walking normally.   - MODERATE: Feels unsteady when walking, but not falling; interferes with normal activities (e.g., school, work).   - SEVERE: Unable to walk without falling, or requires assistance to walk without falling; feels like passing out now.      Stand and sitting  5. ONSET:  "When did the dizziness begin?"     3 days 10. OTHER SYMPTOMS: "Do you have any other symptoms?" (e.g., fever,  chest pain, vomiting, diarrhea, bleeding)       no  Protocols used: Dizziness - Lightheadedness-A-AH

## 2023-03-30 ENCOUNTER — Other Ambulatory Visit: Payer: Self-pay

## 2023-03-30 ENCOUNTER — Ambulatory Visit (INDEPENDENT_AMBULATORY_CARE_PROVIDER_SITE_OTHER): Payer: Self-pay | Admitting: Obstetrics & Gynecology

## 2023-03-30 ENCOUNTER — Ambulatory Visit (INDEPENDENT_AMBULATORY_CARE_PROVIDER_SITE_OTHER): Payer: Self-pay | Admitting: Obstetrics and Gynecology

## 2023-03-30 ENCOUNTER — Other Ambulatory Visit (HOSPITAL_COMMUNITY)
Admission: RE | Admit: 2023-03-30 | Discharge: 2023-03-30 | Disposition: A | Discharge status: Home / Self Care | Payer: Self-pay | Source: Ambulatory Visit | Attending: Obstetrics and Gynecology | Admitting: Obstetrics and Gynecology

## 2023-03-30 ENCOUNTER — Encounter: Payer: Self-pay | Admitting: Obstetrics & Gynecology

## 2023-03-30 VITALS — BP 123/74 | HR 91 | Ht <= 58 in | Wt 152.0 lb

## 2023-03-30 VITALS — BP 123/74 | HR 58 | Wt 152.0 lb

## 2023-03-30 DIAGNOSIS — N898 Other specified noninflammatory disorders of vagina: Secondary | ICD-10-CM | POA: Insufficient documentation

## 2023-03-30 DIAGNOSIS — Z30431 Encounter for routine checking of intrauterine contraceptive device: Secondary | ICD-10-CM

## 2023-03-30 DIAGNOSIS — N941 Unspecified dyspareunia: Secondary | ICD-10-CM

## 2023-03-30 DIAGNOSIS — R61 Generalized hyperhidrosis: Secondary | ICD-10-CM

## 2023-03-30 MED ORDER — GABAPENTIN 600 MG PO TABS
600.0000 mg | ORAL_TABLET | Freq: Every day | ORAL | 3 refills | Status: DC
Start: 2023-03-30 — End: 2023-12-01
  Filled 2023-03-30: qty 30, 30d supply, fill #0

## 2023-03-30 NOTE — Progress Notes (Signed)
GYNECOLOGY OFFICE VISIT NOTE  History:   Amber Mergen Junius Travis is a 42 y.o. [phone] here today for evaluation of a few symptoms. Patient is Spanish-speaking only, interpreter present for this encounter.  She reports some pain and burning in vagina during some episodes of intercourse, already this self swab earlier today, results pending. Wants IUD checked, Liletta placed two years ago.  Pain episodes happen occasionally. Also reports increase in "feeling very hot at night with sweating", wants medication for this.  She denies any abnormal vaginal discharge, bleeding, other pelvic pain or other concerns.    Past Medical History:  Diagnosis Date   Carpal tunnel syndrome, right 06/2018   Depression     Past Surgical History:  Procedure Laterality Date   CARPAL TUNNEL RELEASE Right 06/28/2018   Procedure: RIGHT CARPAL TUNNEL RELEASE;  Surgeon: Tarry Kos, MD;  Location: [facility];  Service: Orthopedics;  Laterality: Right;   LAPAROSCOPIC APPENDECTOMY N/A 05/10/2016   Procedure: APPENDECTOMY LAPAROSCOPIC;  Surgeon: Jimmye Norman, MD;  Location: [facility] OR;  Service: General;  Laterality: N/A;    The following portions of the patient's history were reviewed and updated as appropriate: allergies, current medications, past family history, past medical history, past social history, past surgical history and problem list.   Health Maintenance:  Normal pap and negative HRHPV on 08/13/2021.    Review of Systems:  Pertinent items noted in HPI and remainder of comprehensive ROS otherwise negative.  Physical Exam:  BP 123/74   Pulse 91   Ht 4\' 10"  (1.473 m)   Wt 152 lb (68.9 kg)   BMI 31.77 kg/m  CONSTITUTIONAL: Well-developed, well-nourished female in no acute distress.  HEENT:  Normocephalic, atraumatic. External right and left ear normal. No scleral icterus.  NECK: Normal range of motion, supple, no masses noted on observation SKIN: No rash noted. Not diaphoretic. No  erythema. No pallor. MUSCULOSKELETAL: Normal range of motion. No edema noted. NEUROLOGIC: Alert and oriented to person, place, and time. Normal muscle tone coordination. No cranial nerve deficit noted. PSYCHIATRIC: Normal mood and affect. Normal behavior. Normal judgment and thought content. CARDIOVASCULAR: Normal heart rate noted RESPIRATORY: Effort and breath sounds normal, no problems with respiration noted ABDOMEN: No masses noted. No other overt distention noted.   PELVIC: Normal appearing external genitalia; normal urethral meatus; normal appearing vaginal mucosa and cervix. IUD strings seen.  No abnormal discharge noted.  Normal uterine size, no other palpable masses, no uterine or adnexal tenderness. Performed in the presence of a chaperone. Patient reported vaginal burning after examination, no tenderness during examination.    Assessment and Plan:     1. Night sweats Discussed perimenopausal vasomotor symptoms, lifestyle interventions and medical interventions.  She was interested in trial of Neurontin, will prescribe.  Monitor effect.  If no change, consider SSRI/SNRI vs hormonal therapy. - gabapentin (NEURONTIN) 600 MG tablet; Take 1 tablet (600 mg total) by mouth at bedtime.  Dispense: 30 tablet; Refill: 3  2. IUD check up IUD strings seen, ultrasound will verify IUD intrauterine position.  3. Dyspareunia in female Discussed need for adequate lubrication during intercourse. Will follow up swab results and manage accordingly. Will follow up ultrasound results also.   - US PELVIC COMPLETE WITH TRANSVAGINAL; Future   Routine preventative health maintenance measures emphasized. Please refer to After Visit Summary for other counseling recommendations.   Return for any gynecologic concerns.    I spent 30 minutes dedicated to the care of this patient including pre-visit  review of records, face to face time with the patient discussing her conditions and treatments and post visit  orders.    Jaynie Collins, MD, FACOG Obstetrician & Gynecologist, [facility] for [employer], [facility]

## 2023-03-30 NOTE — Progress Notes (Signed)
Patient here due to having vaginal pain and burning. She stated she feels the burning sensation while she sits and while she's using the restroom. The discomfort started roughly 1 weeks ago.   Denies any abnormal vaginal discharge.   Denies using new soaps or clothes detergent.   Patient stated that she had intercourse with her partner a week ago. She then, had the burning sensation along with itching.  Self swab explained and offered to patient and she accepted. Specimen collected.  Alesia Morin   03/30/23  10:16am

## 2023-03-31 ENCOUNTER — Telehealth: Payer: Self-pay | Admitting: Family Medicine

## 2023-03-31 LAB — CERVICOVAGINAL ANCILLARY ONLY
Bacterial Vaginitis (gardnerella): POSITIVE — AB
Candida Vaginitis: NEGATIVE
Comment: NEGATIVE
Comment: NEGATIVE
Comment: NEGATIVE
Comment: NEGATIVE
Comment: NEGATIVE
Comment: NORMAL
Neisseria Gonorrhea: NEGATIVE
Trichomonas: NEGATIVE

## 2023-03-31 NOTE — Telephone Encounter (Signed)
I called Amber Travis with Interpreter Eda Royal and she confirmed she took Gabapentin last night at 10pm and since she got up today she has been feeling dizzy. She states she is at work at present.  I informed her not to take any further doses and we will check with Dr. Macon Large to see if she wants to change her medication. She voices understanding. Nancy Fetter

## 2023-03-31 NOTE — Telephone Encounter (Signed)
PT. Called in stating that she was given a medication yesterday and was told take it at night. She took it and been dizzy ever since. She says she does not feel good ever since she took the medicine.

## 2023-03-31 NOTE — Telephone Encounter (Signed)
Per Dr. Macon Large:   She can take half the dose, maybe 600 mg is too strong for her.  Sedation is unfortunately a normal side effect of this medication. If she is concerned about this side effect, she can discontinue.  And then she can consider starting other medication  (antidepressants or hormonal therapy) for her night sweats, can make appointment to discuss these other options.   Amber Collins, MD    Called patient using [employer] [phone] at number listed in chart--verified patient with full name and DOB. Relayed information from provider listed above. Patient verbalized understanding, stated she would trial taking half of the prescribed dose, and had no other questions or concerns.   Maureen Ralphs RN on 03/31/23 at [phone]

## 2023-04-01 ENCOUNTER — Other Ambulatory Visit: Payer: Self-pay

## 2023-04-01 MED ORDER — METRONIDAZOLE 500 MG PO TABS
500.0000 mg | ORAL_TABLET | Freq: Two times a day (BID) | ORAL | 0 refills | Status: AC
  Filled 2023-04-01: qty 14, 7d supply, fill #0

## 2023-04-01 NOTE — Addendum Note (Signed)
Addended by: Volcano Bing on: 04/01/2023 08:41 AM   Modules accepted: Orders

## 2023-04-04 ENCOUNTER — Other Ambulatory Visit: Payer: Self-pay

## 2023-04-04 ENCOUNTER — Telehealth: Payer: Self-pay | Admitting: General Practice

## 2023-04-04 NOTE — Telephone Encounter (Signed)
Patient called and left message on nurse voicemail line stating she was prescribed an antibiotic but isn't sure why. She requests a call back. Message was left in spanish.   Called patient with Debarah Crape assisting with spanish interpretation and reviewed results as well as medication instructions. Patient verbalized understanding.

## 2023-04-07 ENCOUNTER — Ambulatory Visit (HOSPITAL_COMMUNITY): Payer: Self-pay

## 2023-12-01 ENCOUNTER — Ambulatory Visit: Payer: Self-pay | Attending: Physician Assistant | Admitting: Physician Assistant

## 2023-12-01 ENCOUNTER — Other Ambulatory Visit: Payer: Self-pay

## 2023-12-01 ENCOUNTER — Encounter: Payer: Self-pay | Admitting: Physician Assistant

## 2023-12-01 VITALS — BP 121/77 | HR 84 | Resp 19 | Ht <= 58 in | Wt 135.0 lb

## 2023-12-01 DIAGNOSIS — Z758 Other problems related to medical facilities and other health care: Secondary | ICD-10-CM

## 2023-12-01 DIAGNOSIS — R42 Dizziness and giddiness: Secondary | ICD-10-CM

## 2023-12-01 DIAGNOSIS — F418 Other specified anxiety disorders: Secondary | ICD-10-CM

## 2023-12-01 DIAGNOSIS — N750 Cyst of Bartholin's gland: Secondary | ICD-10-CM

## 2023-12-01 DIAGNOSIS — Z603 Acculturation difficulty: Secondary | ICD-10-CM

## 2023-12-01 DIAGNOSIS — F341 Dysthymic disorder: Secondary | ICD-10-CM

## 2023-12-01 DIAGNOSIS — F32A Depression, unspecified: Secondary | ICD-10-CM

## 2023-12-01 DIAGNOSIS — Z1331 Encounter for screening for depression: Secondary | ICD-10-CM

## 2023-12-01 DIAGNOSIS — R5383 Other fatigue: Secondary | ICD-10-CM

## 2023-12-01 DIAGNOSIS — I7300 Raynaud's syndrome without gangrene: Secondary | ICD-10-CM

## 2023-12-01 MED ORDER — FLUCONAZOLE 150 MG PO TABS
150.0000 mg | ORAL_TABLET | Freq: Once | ORAL | 0 refills | Status: AC
  Filled 2023-12-01: qty 1, 1d supply, fill #0

## 2023-12-01 MED ORDER — CITALOPRAM HYDROBROMIDE 10 MG PO TABS
10.0000 mg | ORAL_TABLET | Freq: Every day | ORAL | 3 refills | Status: AC
  Filled 2023-12-01: qty 30, 30d supply, fill #0
  Filled 2024-05-27 – 2024-05-28 (×2): qty 30, 30d supply, fill #1
  Filled 2024-07-23 (×2): qty 30, 30d supply, fill #2
  Filled 2024-08-24 (×2): qty 30, 30d supply, fill #3

## 2023-12-01 MED ORDER — AMOXICILLIN-POT CLAVULANATE 875-125 MG PO TABS
1.0000 | ORAL_TABLET | Freq: Two times a day (BID) | ORAL | 0 refills | Status: AC
  Filled 2023-12-01: qty 20, 10d supply, fill #0

## 2023-12-01 MED ORDER — AMLODIPINE BESYLATE 2.5 MG PO TABS
2.5000 mg | ORAL_TABLET | Freq: Every day | ORAL | 1 refills | Status: DC
  Filled 2023-12-01: qty 90, 90d supply, fill #0
  Filled 2024-02-24 (×2): qty 90, 90d supply, fill #1

## 2023-12-01 NOTE — Progress Notes (Signed)
 Fatigue for a month , lump in vagina with discomfort during intercourse . Cold hands.

## 2023-12-01 NOTE — Progress Notes (Signed)
 Patient ID: Amber Travis, female   DOB: 10-14-1980, 43 y.o.   MRN: [id number]   Amber Travis, is a 43 y.o. female  ZHY:[id number]  [id number]  DOB - 1980-11-09  Chief Complaint  Patient presents with   Fatigue       Subjective:   Amber Travis is a 43 y.o. female here today for multiple issues and concerns.  She has been feeling more tired and fatigued the last few months.  No changes in life or anything she feels precipitated this.    She is also having depression again and positive depression screening PHQ9 #9=0.  She took celexa in the past and it helped a lot.  She does not necessarily want to take medications.  She is open to counseling but does not have orange card.  Denies SI/HI.  Protective-family, job  Also has chronically cold hands and feet.  Fingers and toes turn blue.  Feet feel like they are freezing at night and this causes tingling.    Bartholin's gland in vaginal area feels infected.  Pain with intercourse.  No vaginal discharge.  No fever or pelvis pain.  No N/V/D  Feels dizzy at times.  No CP/SOB.  Does not drink hardly any water.  Poor sleep.     No problems updated.  ALLERGIES: No Known Allergies  PAST MEDICAL HISTORY: Past Medical History:  Diagnosis Date   Carpal tunnel syndrome, right 06/2018   Depression     MEDICATIONS AT HOME: Prior to Admission medications   Medication Sig Start Date End Date Taking? Authorizing Provider  amLODipine (NORVASC) 2.5 MG tablet Take 1 tablet (2.5 mg total) by mouth daily. For cold hands and feet 12/01/23  Yes Anders Simmonds, PA-C  amoxicillin-clavulanate (AUGMENTIN) 875-125 MG tablet Take 1 tablet by mouth 2 (two) times daily. 12/01/23  Yes Georgian Co M, PA-C  fluconazole (DIFLUCAN) 150 MG tablet Take 1 tablet (150 mg total) by mouth once for 1 dose. 12/01/23 12/02/23 Yes Linnaea Ahn, Marzella Schlein, PA-C  citalopram (CELEXA) 10 MG tablet Take 1 tablet (10 mg total) by mouth daily.  12/01/23   Preston Weill, Marzella Schlein, PA-C    ROS: Neg HEENT Neg resp Neg cardiac Neg GI Neg GU Neg MS Neg psych Neg neuro  Objective:   Vitals:   12/01/23 0856  BP: 121/77  Pulse: 84  Resp: 19  SpO2: 100%  Weight: 135 lb (61.2 kg)  Height: 4\' 10"  (1.473 m)   Exam General appearance : Awake, alert, not in any distress. Speech Clear. Not toxic looking HEENT: Atraumatic and Normocephalic Neck: Supple, no JVD. No cervical lymphadenopathy.  Chest: Good air entry bilaterally, CTAB.  No rales/rhonchi/wheezing CVS: S1 S2 regular, no murmurs.  Extremities: B/L Lower Ext shows no edema, both legs are warm to touch Neurology: Awake alert, and oriented X 3, CN II-XII intact, Non focal Skin: No Rash  Data Review Lab Results  Component Value Date   HGBA1C 5.2 08/21/2019   HGBA1C 5.1 04/05/2017   HGBA1C 5.0 02/20/2014    Assessment & Plan   1. Bartholin cyst (Primary) - Ambulatory referral to Gynecology - amoxicillin-clavulanate (AUGMENTIN) 875-125 MG tablet; Take 1 tablet by mouth 2 (two) times daily.  Dispense: 20 tablet; Refill: 0 And diflucan if yeast infection  2. ANXIETY DEPRESSION Counseled on self-care, nutrition, exercise, sleep hygiene.  She is hesitant to restart meds even though they really helped in the past.  I have explained the pathophysiology in depth and have encouraged  her to restart since they helped so much before - TSH - Vitamin D, 25-hydroxy - Ambulatory referral to Psychology - citalopram (CELEXA) 10 MG tablet; Take 1 tablet (10 mg total) by mouth daily.  Dispense: 30 tablet; Refill: 3  3. Positive depression screening - Ambulatory referral to Psychology - citalopram (CELEXA) 10 MG tablet; Take 1 tablet (10 mg total) by mouth daily.  Dispense: 30 tablet; Refill: 3  4. Raynaud's phenomenon without gangrene - TSH - Basic Metabolic Panel Amlodipine 2.5 mg daily  5. Fatigue due to depression - TSH - Vitamin D, 25-hydroxy - Ambulatory referral to  Psychology - citalopram (CELEXA) 10 MG tablet; Take 1 tablet (10 mg total) by mouth daily.  Dispense: 30 tablet; Refill: 3  6. Dizziness Increase water intake 64 to 80 ounces daily - TSH - Vitamin D, 25-hydroxy - CBC with Differential  7. Language barrier [employer] "Wynona Canes" interpreters used and additional time performing visit was required.     Return in about 6 weeks (around 01/12/2024) for PCP for chronic conditions-either Zelda or me for depression.  The patient was given clear instructions to go to ER or return to medical center if symptoms don't improve, worsen or new problems develop. The patient verbalized understanding. The patient was told to call to get lab results if they haven't heard anything in the next week.      Georgian Co, PA-C [facility] and [facility] [place], [place] [phone]   12/01/2023, 9:43 AM

## 2023-12-01 NOTE — Patient Instructions (Addendum)
 Quiste de [employer] Bartholin's Cyst  Un quiste de Bartolino es un saco lleno de lquido que se forma en una glndula de [place]. Las glndulas de Bartolino son pequeas glndulas que se encuentran en los pliegues de la piel cerca del orificio de la vagina (labios de la vulva). Este tipo de quiste forma un bulto o una hinchazn cerca del orificio de la vagina. Si tiene un quiste pequeo y no infectado, es posible que pueda tratarlo en su casa. Si el quiste se infecta, puede causar dolor y es posible que el mdico deba drenarlo. Cules son las causas? Esta afeccin puede producirse por una glndula de Bartolino obstruida. Microbios (bacterias) dentro del quiste que pueden causar una infeccin. Cules son los signos o sntomas? Un bulto o una hinchazn cerca del orificio inferior de la vagina. Molestias o dolor. Enrojecimiento, hinchazn y secrecin de lquido en el rea. Cmo se trata? Es posible que no necesite tratamiento si el quiste no causa sntomas. El quiste puede desaparecer por s solo con Theatre stage manager. El cuidado en el hogar incluye baos calientes o terapia de calor. Los quistes grandes o los quistes infectados pueden tratarse con: Antibiticos. Procedimiento para drenar el lquido. Los quistes recurrentes tendrn que drenarse muchas veces. El mdico puede hablar con usted sobre la ciruga para extirpar el quiste. Siga estas instrucciones en su casa: Medicamentos Use los medicamentos de venta libre y los recetados solamente como se lo haya indicado el mdico. Si le recetaron un antibitico, tmelo como se lo haya indicado el mdico. No deje de tomarlo aunque comience a sentirse mejor. Control del dolor y de la hinchazn Pruebe con un bao de asiento para Teacher, early years/pre y la hinchazn. Un bao de asiento es un bao con agua tibia en el cual el agua solo le llega hasta la cadera y debe cubrir las nalgas. Puede tomar baos de asiento algunas veces al [employer]. Si se lo  indican, use calor en la zona afectada con la frecuencia que sea necesaria. Use la fuente de calor que el mdico le recomiende, como una compresa de calor hmedo o una almohadilla trmica. Coloque una toalla entre la piel y la fuente de Airline pilot. Aplique calor durante 20 a 30 minutos. Retire la fuente de calor si la piel se le pone de color rojo brillante. Esto es [employer]. Si no puede sentir dolor, calor o fro, tiene un mayor riesgo de [place]. Indicaciones generales Si el quiste fue drenado: Siga las instrucciones del mdico en lo que respecta al cuidado de la herida. Utilice toallas femeninas para absorber el lquido. No apriete ni haga presin [employer]. No tenga relaciones sexuales hasta que el quiste haya desaparecido o la herida del drenaje haya sanado. Tome estas medidas para ayudar a Air traffic controller de un quiste de Bartolino y que se formen otros quistes: Tome un bao o una ducha una vez da. Higienice la zona alrededor de la vagina con agua y un jabn suave cuando se bae. Practique el sexo seguro para prevenir las ITS. Hable con el mdico sobre cmo prevenir las infecciones de transmisin sexual (ITS) y qu mtodos anticonceptivos pueden ser mejores para usted. Cumpla con todas las visitas de seguimiento. Comunquese con un mdico si: Tiene fiebre. Tiene ms enrojecimiento, hinchazn o dolor alrededor del quiste. Tiene lquido, sangre, pus o mal olor que proviene del [place]. Tiene un quiste que [place] de tamao o un quiste que vuelve a formarse. Resumen Un quiste de [place] es un  saco lleno de lquido que se forma en una glndula de [place]. Estas pequeas glndulas se encuentran en los pliegues de la piel cerca del orificio de la vagina (labios de la vulva). Este tipo de quiste forma un bulto o una hinchazn cerca del orificio de la vagina. Pruebe con baos de asiento algunas veces al da para ayudar a Teacher, early years/pre y la hinchazn. No apriete ni haga  presin [employer]. Esta informacin no tiene Theme park manager el consejo del mdico. Asegrese de hacerle al mdico cualquier pregunta que tenga. Document Revised: 03/14/2020 Document Reviewed: 03/14/2020 Elsevier Patient Education  2024 Elsevier Inc.Fenmeno de Raynaud Raynaud's Phenomenon  El fenmeno de Raynaud es una enfermedad que afecta los vasos sanguneos (arterias) que transportan la sangre a los dedos de las manos y de los pies. Las arterias que [address] a las [place], los labios, los pezones o la punta de la nariz tambin pueden verse afectadas. El fenmeno de Raynaud causa el estrechamiento temporal de las arterias (espasmo). Como consecuencia, la irrigacin de sangre a las zonas afectadas disminuye transitoriamente. Esto suele ocurrir en respuesta a las bajas temperaturas o el estrs. Durante una crisis, la piel de las zonas afectadas se pone blanca, luego azul y finalmente roja. Adems, la persona puede sentir hormigueo o adormecimiento en esas zonas. Generalmente, las crisis duran [place], y [employer] la irrigacin de sangre a la zona se Air traffic controller. En la [employer], el fenmeno de Raynaud no causa problemas graves de [place]. Cules son las causas? En muchos casos, se desconoce la causa de esta afeccin. La afeccin puede manifestarse por s sola (fenmeno de Raynaud primario) o puede estar asociada a otras enfermedades u otros factores (fenmeno de Raynaud secundario). Entre las causas posibles pueden incluirse: Enfermedades o afecciones que daan las arterias. Lesiones o acciones repetitivas que [employer] Station[employer] o los pies. La exposicin a ciertas sustancias qumicas. Tomar medicamentos que producen el estrechamiento de las arterias. Otros trastornos mdicos, tales como lupus, esclerodermia, artritis reumatoide, problemas de tiroides, trastornos de [employer], sndrome de [place] o Gaffer. Qu incrementa el riesgo? Los siguientes factores pueden  hacer que sea ms propenso a desarrollar esta afeccin: Tener entre 20 y 39 aos. Ser mujer. Tener antecedentes familiares del fenmeno de Raynaud. Vivir en un lugar con bajas temperaturas. Fumar. Cules son los signos o sntomas? Los sntomas de fenmeno de Raynaud normalmente se manifiestan cuando una persona se expone a bajas temperaturas o sufre estrs emocional. Pueden durar unos minutos o varias horas. Por lo general, esta enfermedad afecta los dedos de las [address], pero tambin puede [employer] dedos de los pies, los pezones, los labios, las orejas o la punta de la nariz. Entre los sntomas, se pueden incluir los siguientes: Cambios en el color de la piel. La piel de las zonas afectadas se tornar plida o blanca. Luego, puede pasar de blanca a [place] y de [place] a roja, a medida que se restablece la irrigacin normal de sangre a la zona. Adormecimiento, hormigueo o dolor en las zonas afectadas. En los casos graves, los sntomas pueden incluir lo siguiente: Probation officer piel. Deterioro y [employer] tejidos (gangrena). Cmo se diagnostica? Esta afeccin se puede diagnosticar en funcin de lo siguiente: Los sntomas y los antecedentes mdicos. Un examen fsico. Durante el examen, pueden indicarle que ponga las manos en agua fra para determinar si hay una reaccin a la baja temperatura. Estudios, como, por ejemplo: Anlisis de sangre para detectar si  hay otras enfermedades o afecciones. Un estudio para Nurse, children's de la sangre a travs de las arterias y las venas (ecografa vascular). Un estudio mediante el cual la piel de la base de las uas de las manos se examina con un microscopio (capilaroscopia del lecho ungueal). Cmo se trata? Durante un episodio, puede tomar medidas para ayudar a que los sntomas desaparezcan ms rpido. Las opciones [employer] brazos como la aspas de un molino de viento, calentarse los dedos debajo de agua caliente o poner los dedos en  un pliegue clido del cuerpo, como la [place]. El tratamiento a largo plazo para esta afeccin suele implicar hacer cambios en el estilo de vida y tomar medidas para controlar la exposicin a las Barrister's clerk. En los casos ms graves, pueden usarse medicamentos (antagonistas del calcio) para Dealer sangunea. Siga estas instrucciones en su casa: Evite las bajas temperaturas Para evitar la exposicin al fro, siga estos pasos: Si es posible, qudese adentro cuando haga fro. Cuando salga durante la poca de bajas temperaturas, vstase con varias capas de ropa y use mitones, un gorro, una bufanda y zapatos abrigados. Use mitones o guantes cuando manipule hielo o comida congelada. Use portavasos o portalatas para los vasos o las latas que contengan bebidas fras. Deje correr el agua caliente durante un rato antes de tomar una ducha o un bao. Caliente el automvil antes de [employer]. Estilo de vida Si es posible, evite las situaciones emotivas y Geographical information systems officer. Trate de encontrar herramientas para controlar el estrs, por ejemplo: Actividad fsica. Yoga. Meditacin. Biorretroalimentacin. No consuma ningn producto que contenga nicotina o tabaco. Estos productos incluyen cigarrillos, tabaco para Theatre manager y aparatos de vapeo, como los Administrator, Civil Service. Si necesita ayuda para dejar de consumir estos productos, consulte al [employer] Express. Evite ser fumador pasivo. Limite el consumo de cafena. En cambio, tome caf, t y gaseosas descafeinadas. Evite el chocolate. No use herramientas ni maquinaria que vibren. Indicaciones generales Protjase las manos y los pies para no sufrir lesiones, cortes o moretones. Evite el uso de anillos o pulseras muy ajustados. Use medias flojas y zapatos cmodos y amplios. Use los medicamentos de venta libre y los recetados solamente como se lo haya indicado el mdico. Dnde buscar apoyo Raynaud's Association (Asociacin de Raynaud):  www.raynauds.org Dnde buscar ms informacin [employer] Mills of Arthritis and Musculoskeletal and Skin Diseases (Instituto [employer] de Artritis y [place] Musculoesquelticas y Arboriculturist): www.niams.http://www.myers.net/ Comunquese con un mdico si: Las [employer] Us a pesar de los cambios en el estilo de vida. Le aparecen llagas en los dedos de las manos o de los pies que no se cicatrizan. Tiene estras en la piel de los dedos de las manos o de los pies. Tiene fiebre. Tiene dolor o hinchazn en las articulaciones. Tiene una erupcin cutnea. Los sntomas aparecen en un solo lado del cuerpo. Solicite ayuda de inmediato si: Los dedos de las manos o de los pies se tornan negros. Tiene dolor intenso en las zonas afectadas. Estos sntomas pueden representar un problema grave que constituye Radio broadcast assistant. No espere a ver si los sntomas desaparecen. Solicite atencin mdica de inmediato. Comunquese con el servicio de emergencias de su localidad (911 en los [place]). No conduzca por sus propios medios [employer]. Resumen El fenmeno de Raynaud es una enfermedad que afecta las arterias que transportan la sangre a los dedos de las manos y de los pies, las [place], los labios, los pezones o la punta de la nariz. En  muchos casos, se desconoce la causa de esta afeccin. Los sntomas de esta afeccin incluyen cambios en el color de la piel junto con entumecimiento y hormigueo de la zona afectada. El tratamiento de esta enfermedad incluye cambios en el estilo de vida y reduccin de la exposicin a las bajas temperaturas. Pueden usarse medicamentos para los casos graves de Astronomer. Comunquese con su mdico si la afeccin empeora aunque reciba tratamiento. Esta informacin no tiene Theme park manager el consejo del mdico. Asegrese de hacerle al mdico cualquier pregunta que tenga. Document Revised: 11/10/2020 Document Reviewed: 11/10/2020 Elsevier Patient Education  2024 [employer].

## 2023-12-02 ENCOUNTER — Encounter: Payer: Self-pay | Admitting: Physician Assistant

## 2023-12-02 LAB — BASIC METABOLIC PANEL WITH GFR
BUN/Creatinine Ratio: 19 (ref 9–23)
BUN: 15 mg/dL (ref 6–24)
CO2: 24 mmol/L (ref 20–29)
Calcium: 10.2 mg/dL (ref 8.7–10.2)
Chloride: 102 mmol/L (ref 96–106)
Creatinine, Ser: 0.81 mg/dL (ref 0.57–1.00)
Glucose: 89 mg/dL (ref 70–99)
Potassium: 4.6 mmol/L (ref 3.5–5.2)
Sodium: 141 mmol/L (ref 134–144)
eGFR: 93 mL/min/{1.73_m2} (ref >59)

## 2023-12-02 LAB — CBC WITH DIFFERENTIAL/PLATELET
Basophils Absolute: 0.1 10*3/uL (ref 0.0–0.2)
Basos: 1 %
EOS (ABSOLUTE): 0.1 10*3/uL (ref 0.0–0.4)
Eos: 2 %
Hematocrit: 44.6 % (ref 34.0–46.6)
Hemoglobin: 15.1 g/dL (ref 11.1–15.9)
Immature Grans (Abs): 0 10*3/uL (ref 0.0–0.1)
Immature Granulocytes: 0 %
Lymphocytes Absolute: 1.7 10*3/uL (ref 0.7–3.1)
Lymphs: 35 %
MCH: 32.1 pg (ref 26.6–33.0)
MCHC: 33.9 g/dL (ref 31.5–35.7)
MCV: 95 fL (ref 79–97)
Monocytes Absolute: 0.4 10*3/uL (ref 0.1–0.9)
Monocytes: 7 %
Neutrophils Absolute: 2.7 10*3/uL (ref 1.4–7.0)
Neutrophils: 55 %
Platelets: 247 10*3/uL (ref 150–450)
RBC: 4.7 x10E6/uL (ref 3.77–5.28)
RDW: 12 % (ref 11.7–15.4)
WBC: 4.8 10*3/uL (ref 3.4–10.8)

## 2023-12-02 LAB — VITAMIN D 25 HYDROXY (VIT D DEFICIENCY, FRACTURES): Vit D, 25-Hydroxy: 34.8 ng/mL (ref 30.0–100.0)

## 2023-12-02 LAB — TSH: TSH: 0.658 u[IU]/mL (ref 0.450–4.500)

## 2023-12-06 ENCOUNTER — Ambulatory Visit (HOSPITAL_COMMUNITY): Payer: Self-pay | Admitting: Clinical

## 2024-01-03 ENCOUNTER — Ambulatory Visit: Payer: Self-pay | Admitting: Nurse Practitioner

## 2024-01-25 ENCOUNTER — Encounter: Payer: Self-pay | Admitting: Nurse Practitioner

## 2024-01-25 ENCOUNTER — Ambulatory Visit: Payer: Self-pay | Attending: Nurse Practitioner | Admitting: Nurse Practitioner

## 2024-01-25 ENCOUNTER — Other Ambulatory Visit (HOSPITAL_COMMUNITY)
Admission: RE | Admit: 2024-01-25 | Discharge: 2024-01-25 | Disposition: A | Discharge status: Home / Self Care | Payer: Self-pay | Source: Skilled Nursing Facility | Attending: Nurse Practitioner | Admitting: Nurse Practitioner

## 2024-01-25 VITALS — BP 107/68 | HR 85 | Resp 18 | Ht <= 58 in | Wt 133.2 lb

## 2024-01-25 DIAGNOSIS — F341 Dysthymic disorder: Secondary | ICD-10-CM

## 2024-01-25 DIAGNOSIS — Z23 Encounter for immunization: Secondary | ICD-10-CM

## 2024-01-25 DIAGNOSIS — N898 Other specified noninflammatory disorders of vagina: Secondary | ICD-10-CM | POA: Insufficient documentation

## 2024-01-25 DIAGNOSIS — F418 Other specified anxiety disorders: Secondary | ICD-10-CM

## 2024-01-25 NOTE — Progress Notes (Signed)
 Assessment & Plan:  Amber Travis was seen today for depression.  Diagnoses and all orders for this visit:  ANXIETY DEPRESSION She never started Celexa . Does not want to take any SSRIs at this time.   Need for Tdap vaccination -     Tdap vaccine greater than or equal to 43yo IM  Vaginal irritation -     Cervicovaginal ancillary only -     Urinalysis, Complete; Future    Patient has been counseled on age-appropriate routine health concerns for screening and prevention. These are reviewed and up-to-date. Referrals have been placed accordingly. Immunizations are up-to-date or declined.    Subjective:   Chief Complaint  Patient presents with   Depression    Amber Travis 43 y.o. female presents to office today for anxiety and depression  She had a visit with me on 01/26/2022 for anxiety and depression/see notes below: She endorses undesirable side effects with taking buspar  (dizziness with driving) and hydroxyzine . Feels her anxiety is not controlled. States her anxiety causes her to stress eat and she initially requested a refill of phentermine which was prescribed by another prescriber. I instructed her that I do not prescribe phentermine but would be glad to provide an alternative medication that would possibly help relieve stress and anxiety to help keep her from overeating. She is also having depression again and positive depression screening PHQ9 #9=0. She took celexa  in the past and it helped a lot. She does not necessarily want to take medications. She is open to counseling but does not have orange card. Denies SI/HI. Protective-family, job    She had another visit with a provider here on 12/01/2023/see notes below:: She is also having depression again and positive depression screening PHQ9 #9=0. She took celexa  in the past and it helped a lot. She does not necessarily want to take medications. She is open to counseling but does not have orange card. Denies SI/HI.  Protective-family, job     After her visit with the provider here in March she was referred to psychiatry.  They were able to get her scheduled however she was a no-show to the appointment.  They called her back again to get her rescheduled after the no-show appointment and were unable to get in touch with her after 3 call attempts.  Today she reports she did not restart the Celexa  however she is still open to psychotherapy.    01/25/2024    9:29 AM 12/01/2023   11:45 AM 12/23/2021    9:20 AM  Depression screen PHQ 2/9  Decreased Interest 3 3 3   Down, Depressed, Hopeless 2 3 3   PHQ - 2 Score 5 6 6   Altered sleeping 3 3 3   Tired, decreased energy 2 3 2   Change in appetite 2 2 3   Feeling bad or failure about yourself  1 3 2   Trouble concentrating 1 2 3   Moving slowly or fidgety/restless 1 3 2   Suicidal thoughts 0 0 0  PHQ-9 Score 15 22 21   Difficult doing work/chores Somewhat difficult Somewhat difficult        01/25/2024    9:30 AM 12/01/2023   12:01 PM 04/22/2021   11:01 AM 09/17/2020    5:18 PM  GAD 7 : Generalized Anxiety Score  Nervous, Anxious, on Edge 3 3 0 3  Control/stop worrying 3 3 2 1   Worry too much - different things 3 3 1 3   Trouble relaxing 3 2 3 2   Restless 2 3 1  0  Easily annoyed or irritable 2 0 2 1  Afraid - awful might happen 2 2 1 1   Total GAD 7 Score 18 16 10 11   Anxiety Difficulty Somewhat difficult Somewhat difficult Somewhat difficult    She endorses vaginal burning and dryness.  She notices veins that bulge in her forehead when she is working out.  The veins are not prominent or evident after her workout is over.  She denies any severe headache, chest pain or shortness of breath with working out.  Review of Systems  Constitutional:  Negative for fever, malaise/fatigue and weight loss.  HENT: Negative.  Negative for nosebleeds.   Eyes: Negative.  Negative for blurred vision, double vision and photophobia.  Respiratory: Negative.  Negative for cough  and shortness of breath.   Cardiovascular: Negative.  Negative for chest pain, palpitations and leg swelling.  Gastrointestinal: Negative.  Negative for heartburn, nausea and vomiting.  Genitourinary:        See HPI  Musculoskeletal: Negative.  Negative for myalgias.  Neurological: Negative.  Negative for dizziness, focal weakness, seizures and headaches.  Psychiatric/Behavioral:  Positive for depression. Negative for suicidal ideas. The patient is nervous/anxious.     Past Medical History:  Diagnosis Date   Carpal tunnel syndrome, right 06/2018   Depression     Past Surgical History:  Procedure Laterality Date   CARPAL TUNNEL RELEASE Right 06/28/2018   Procedure: RIGHT CARPAL TUNNEL RELEASE;  Surgeon: Wes Hamman, MD;  Location: [facility];  Service: Orthopedics;  Laterality: Right;   LAPAROSCOPIC APPENDECTOMY N/A 05/10/2016   Procedure: APPENDECTOMY LAPAROSCOPIC;  Surgeon: Jerryl Morin, MD;  Location: [facility];  Service: General;  Laterality: N/A;    Family History  Problem Relation Age of Onset   Diabetes Mother     Social History Reviewed with no changes to be made today.   Outpatient Medications Prior to Visit  Medication Sig Dispense Refill   amLODipine  (NORVASC ) 2.5 MG tablet Take 1 tablet (2.5 mg total) by mouth daily. For cold hands and feet 90 tablet 1   amoxicillin -clavulanate (AUGMENTIN ) 875-125 MG tablet Take 1 tablet by mouth 2 (two) times daily. (Patient not taking: Reported on 01/25/2024) 20 tablet 0   citalopram  (CELEXA ) 10 MG tablet Take 1 tablet (10 mg total) by mouth daily. (Patient not taking: Reported on 01/25/2024) 30 tablet 3   No facility-administered medications prior to visit.    No Known Allergies     Objective:    BP 107/68 (BP Location: Left Arm, Patient Position: Sitting, Cuff Size: Normal)   Pulse 85   Resp 18   Ht 4\' 10"  (1.473 m)   Wt 133 lb 3.2 oz (60.4 kg)   LMP  (LMP Unknown)   SpO2 98%   BMI 27.84 kg/m  Wt Readings  from Last 3 Encounters:  01/25/24 133 lb 3.2 oz (60.4 kg)  12/01/23 135 lb (61.2 kg)  03/30/23 152 lb (68.9 kg)    Physical Exam Vitals and nursing note reviewed.  Constitutional:      Appearance: She is well-developed.  HENT:     Head: Normocephalic and atraumatic.  Cardiovascular:     Rate and Rhythm: Normal rate and regular rhythm.     Heart sounds: Normal heart sounds. No murmur heard.    No friction rub. No gallop.  Pulmonary:     Effort: Pulmonary effort is normal. No tachypnea or respiratory distress.     Breath sounds: Normal breath sounds. No decreased breath sounds, wheezing,  rhonchi or rales.  Chest:     Chest wall: No tenderness.  Abdominal:     General: Bowel sounds are normal.     Palpations: Abdomen is soft.  Musculoskeletal:        General: Normal range of motion.     Cervical back: Normal range of motion.  Skin:    General: Skin is warm and dry.  Neurological:     Mental Status: She is alert and oriented to person, place, and time.     Coordination: Coordination normal.  Psychiatric:        Behavior: Behavior normal. Behavior is cooperative.        Thought Content: Thought content normal.        Judgment: Judgment normal.          Patient has been counseled extensively about nutrition and exercise as well as the importance of adherence with medications and regular follow-up. The patient was given clear instructions to go to ER or return to medical center if symptoms don't improve, worsen or new problems develop. The patient verbalized understanding.   Follow-up: Return if symptoms worsen or fail to improve.   Collins Dean, FNP-BC [facility] [place], [place] [phone]   01/25/2024, 9:45 AM

## 2024-01-25 NOTE — Patient Instructions (Addendum)
 Placed in [facility]  [address]  [place], [place] [address] [phone]  [facility] [address]   [phone]

## 2024-01-26 ENCOUNTER — Ambulatory Visit: Payer: Self-pay | Admitting: Nurse Practitioner

## 2024-01-26 LAB — CERVICOVAGINAL ANCILLARY ONLY
Bacterial Vaginitis (gardnerella): NEGATIVE
Candida Glabrata: NEGATIVE
Candida Vaginitis: POSITIVE — AB
Chlamydia: NEGATIVE
Comment: NEGATIVE
Comment: NEGATIVE
Comment: NEGATIVE
Comment: NEGATIVE
Comment: NEGATIVE
Comment: NORMAL
Neisseria Gonorrhea: NEGATIVE
Trichomonas: NEGATIVE

## 2024-01-26 LAB — URINALYSIS, COMPLETE
Bilirubin, UA: NEGATIVE
Glucose, UA: NEGATIVE
Ketones, UA: NEGATIVE
Nitrite, UA: NEGATIVE
Protein,UA: NEGATIVE
RBC, UA: NEGATIVE
Specific Gravity, UA: 1.01 (ref 1.005–1.030)
Urobilinogen, Ur: 0.2 mg/dL (ref 0.2–1.0)
pH, UA: 6.5 (ref 5.0–7.5)

## 2024-01-26 LAB — MICROSCOPIC EXAMINATION
Bacteria, UA: NONE SEEN
Casts: NONE SEEN /LPF
Epithelial Cells (non renal): >10 /HPF — AB (ref 0–10)

## 2024-01-27 ENCOUNTER — Other Ambulatory Visit: Payer: Self-pay | Admitting: Nurse Practitioner

## 2024-01-27 ENCOUNTER — Other Ambulatory Visit: Payer: Self-pay

## 2024-01-27 DIAGNOSIS — R399 Unspecified symptoms and signs involving the genitourinary system: Secondary | ICD-10-CM

## 2024-01-27 DIAGNOSIS — B3731 Acute candidiasis of vulva and vagina: Secondary | ICD-10-CM

## 2024-01-27 MED ORDER — NITROFURANTOIN MONOHYD MACRO 100 MG PO CAPS
100.0000 mg | ORAL_CAPSULE | Freq: Two times a day (BID) | ORAL | 0 refills | Status: AC
  Filled 2024-01-27: qty 10, 5d supply, fill #0

## 2024-01-27 MED ORDER — FLUCONAZOLE 150 MG PO TABS
150.0000 mg | ORAL_TABLET | Freq: Once | ORAL | 0 refills | Status: AC
  Filled 2024-01-27: qty 1, 1d supply, fill #0

## 2024-02-24 ENCOUNTER — Other Ambulatory Visit: Payer: Self-pay

## 2024-02-27 ENCOUNTER — Other Ambulatory Visit: Payer: Self-pay

## 2024-03-13 ENCOUNTER — Other Ambulatory Visit: Payer: Self-pay

## 2024-03-13 ENCOUNTER — Encounter: Payer: Self-pay | Admitting: Obstetrics and Gynecology

## 2024-03-13 ENCOUNTER — Other Ambulatory Visit (HOSPITAL_COMMUNITY)
Admission: RE | Admit: 2024-03-13 | Discharge: 2024-03-13 | Disposition: A | Discharge status: Home / Self Care | Payer: Self-pay | Source: Ambulatory Visit | Attending: Obstetrics and Gynecology | Admitting: Obstetrics and Gynecology

## 2024-03-13 ENCOUNTER — Ambulatory Visit (INDEPENDENT_AMBULATORY_CARE_PROVIDER_SITE_OTHER): Payer: Self-pay | Admitting: Obstetrics and Gynecology

## 2024-03-13 VITALS — BP 122/73 | HR 74 | Wt 128.2 lb

## 2024-03-13 DIAGNOSIS — N951 Menopausal and female climacteric states: Secondary | ICD-10-CM

## 2024-03-13 DIAGNOSIS — N941 Unspecified dyspareunia: Secondary | ICD-10-CM | POA: Insufficient documentation

## 2024-03-13 DIAGNOSIS — Z758 Other problems related to medical facilities and other health care: Secondary | ICD-10-CM

## 2024-03-13 DIAGNOSIS — Z603 Acculturation difficulty: Secondary | ICD-10-CM

## 2024-03-13 MED ORDER — GABAPENTIN 300 MG PO CAPS
300.0000 mg | ORAL_CAPSULE | Freq: Every evening | ORAL | 1 refills | Status: AC
  Filled 2024-03-13: qty 30, 30d supply, fill #0
  Filled 2024-04-17: qty 30, 30d supply, fill #1

## 2024-03-13 NOTE — Progress Notes (Signed)
 GYNECOLOGY OFFICE VISIT NOTE  History:   Amber Travis is a 43 y.o. [phone] here today for pelvic pressure, dyspareunia, insomnia due to hot flashes, low libido. Liletta  in place, inserted 08/2020, for AUB.   Hot flashes, mostly at night disrupt sleep Dyspareunia with deep penetration Low libido, tried OTC supplement with no improvement. Amenorrhea since Mirena  placement Sexually active, monogamous  PCP started on amlodipine  for Raynaud's - little improvement in intermittent coldness symptoms  Denies weight loss, change in appetite, dryness, incontinence, heart disease, HTN.   She denies any abnormal vaginal bleeding.    Past Medical History:  Diagnosis Date   Carpal tunnel syndrome, right 06/2018   Depression     Past Surgical History:  Procedure Laterality Date   CARPAL TUNNEL RELEASE Right 06/28/2018   Procedure: RIGHT CARPAL TUNNEL RELEASE;  Surgeon: Jerri Kay HERO, MD;  Location: [facility];  Service: Orthopedics;  Laterality: Right;   LAPAROSCOPIC APPENDECTOMY N/A 05/10/2016   Procedure: APPENDECTOMY LAPAROSCOPIC;  Surgeon: Lynwood Pina, MD;  Location: [facility];  Service: General;  Laterality: N/A;    The following portions of the patient's history were reviewed and updated as appropriate: allergies, current medications, past family history, past medical history, past social history, past surgical history and problem list.   Health Maintenance:    Component Value Date/Time   DIAGPAP  08/13/2021 1057    - Negative for intraepithelial lesion or malignancy (NILM)   DIAGPAP  04/05/2018 0000    NEGATIVE FOR INTRAEPITHELIAL LESIONS OR MALIGNANCY.   HPVHIGH Negative 08/13/2021 1057   ADEQPAP  08/13/2021 1057    Satisfactory for evaluation; transformation zone component PRESENT.   ADEQPAP  04/05/2018 0000    Satisfactory for evaluation  endocervical/transformation zone component PRESENT.   High Risk HPV: Positive  Adequacy:  Satisfactory for  evaluation, transformation zone component PRESENT  Diagnosis:  Atypical squamous cells of undetermined significance (ASC-US )   Review of Systems:  Pertinent items noted in HPI and remainder of comprehensive ROS otherwise negative.  Physical Exam:  BP 122/73   Pulse 74   Wt 128 lb 3.2 oz (58.2 kg)   BMI 26.79 kg/m  CONSTITUTIONAL: Well-developed, well-nourished female in no acute distress.  HEENT:  Normocephalic, atraumatic. External right and left ear normal. No scleral icterus.  NECK: Normal range of motion, supple, no masses noted on observation SKIN: No rash noted. Not diaphoretic. No erythema. No pallor. MUSCULOSKELETAL: Normal range of motion. No edema noted. NEUROLOGIC: Alert and oriented to person, place, and time. Normal muscle tone coordination.  PSYCHIATRIC: Normal mood and affect. Normal behavior. Normal judgment and thought content. CARDIOVASCULAR: Normal heart rate noted RESPIRATORY: Effort and breath sounds normal, no problems with respiration noted ABDOMEN: No masses noted. No other overt distention noted.   PELVIC: Normal appearing external genitalia; normal urethral meatus; normal appearing vaginal mucosa and cervix. No prolapse of bowel or bladder. No cyst or mass of introitus or vaginal wall on palpation. Copious mucoid yellow/white discharge from cervical os noted. IUD strings visible. No vaginal atrophy. Mucosa moist. Normal uterine size, no other palpable masses. Moderate uterine, and posterior and anterior vaginal tenderness. Performed in the presence of a chaperone  Labs and Imaging No results found for this or any previous visit (from the past week). No results found.    Assessment and Plan:      1. Language barrier (Primary)  2. Perimenopause - FSH/LH; Future  3. Dyspareunia in female - Cytology - PAP( [place]) -  Cervicovaginal ancillary only( [place])  Trial of Gabapentin  for vaso-motor symptoms and insomnia Follow up on vaginal swabs for  infectious etiology of pelvic pain/pressure - exam suspicious for infection If not infection trial of pelvic floor PT for muscular dysfunction could be considered.   Follow up FSH/LH for menopause status  Spanish in-person interpreter used for encounter  Routine preventative health maintenance measures emphasized. Please refer to After Visit Summary for other counseling recommendations.   Return in about 2 months (around 05/14/2024), or Perimenopause; ?PID.     Mardy Shropshire, MD OB Fellow, Faculty Practice [facility], Center for [facility] 03/13/2024 5:32 PM

## 2024-03-14 ENCOUNTER — Other Ambulatory Visit: Payer: Self-pay

## 2024-03-14 DIAGNOSIS — N951 Menopausal and female climacteric states: Secondary | ICD-10-CM

## 2024-03-15 ENCOUNTER — Other Ambulatory Visit: Payer: Self-pay

## 2024-03-15 ENCOUNTER — Ambulatory Visit: Payer: Self-pay | Admitting: Obstetrics and Gynecology

## 2024-03-15 LAB — CERVICOVAGINAL ANCILLARY ONLY
Bacterial Vaginitis (gardnerella): NEGATIVE
Candida Glabrata: NEGATIVE
Candida Vaginitis: NEGATIVE
Chlamydia: NEGATIVE
Comment: NEGATIVE
Comment: NEGATIVE
Comment: NEGATIVE
Comment: NEGATIVE
Comment: NEGATIVE
Comment: NORMAL
Neisseria Gonorrhea: NEGATIVE
Trichomonas: NEGATIVE

## 2024-03-15 LAB — FSH/LH
FSH: 39.5 m[IU]/mL
LH: 29.4 m[IU]/mL

## 2024-03-20 LAB — CYTOLOGY - PAP: Diagnosis: NEGATIVE

## 2024-04-06 ENCOUNTER — Encounter: Payer: Self-pay | Admitting: Family Medicine

## 2024-04-17 ENCOUNTER — Other Ambulatory Visit: Payer: Self-pay

## 2024-04-19 ENCOUNTER — Other Ambulatory Visit: Payer: Self-pay

## 2024-05-14 ENCOUNTER — Ambulatory Visit: Payer: Self-pay | Admitting: Family Medicine

## 2024-05-14 ENCOUNTER — Encounter: Payer: Self-pay | Admitting: Family Medicine

## 2024-05-14 NOTE — Progress Notes (Signed)
 Patient did not keep appointment today. She may call to reschedule.

## 2024-05-27 ENCOUNTER — Other Ambulatory Visit: Payer: Self-pay | Admitting: Physician Assistant

## 2024-05-27 ENCOUNTER — Other Ambulatory Visit: Payer: Self-pay | Admitting: Obstetrics and Gynecology

## 2024-05-28 ENCOUNTER — Other Ambulatory Visit: Payer: Self-pay

## 2024-05-29 ENCOUNTER — Other Ambulatory Visit: Payer: Self-pay

## 2024-05-29 MED ORDER — AMLODIPINE BESYLATE 2.5 MG PO TABS
2.5000 mg | ORAL_TABLET | Freq: Every day | ORAL | 1 refills | Status: AC
  Filled 2024-05-29: qty 90, 90d supply, fill #0
  Filled 2024-07-23 – 2024-08-24 (×3): qty 90, 90d supply, fill #1

## 2024-05-29 NOTE — Telephone Encounter (Signed)
 Requested Prescriptions  Pending Prescriptions Disp Refills   amLODipine  (NORVASC ) 2.5 MG tablet 90 tablet 1    Sig: Take 1 tablet (2.5 mg total) by mouth daily. For cold hands and feet     Cardiovascular: Calcium Channel Blockers 2 Passed - 05/29/2024  8:46 AM      Passed - Last BP in normal range    BP Readings from Last 1 Encounters:  03/13/24 122/73         Passed - Last Heart Rate in normal range    Pulse Readings from Last 1 Encounters:  03/13/24 74         Passed - Valid encounter within last 6 months    Recent Outpatient Visits           4 months ago ANXIETY DEPRESSION   [facility] [place]. [facility] Theotis Haze ORN, NP   6 months ago Bartholin cyst   [facility] [place]. [facility] [place], Jon HERO, [place]   2 years ago Stress incontinence of urine   [facility] [place]. [facility] Theotis Haze ORN, NP   2 years ago Stress incontinence   [facility] [place]. [facility] Theotis Haze ORN, NP   2 years ago Other fatigue   [facility] Of [place]. [facility] [place], [place], PA-C

## 2024-07-23 ENCOUNTER — Other Ambulatory Visit: Payer: Self-pay

## 2024-07-30 ENCOUNTER — Other Ambulatory Visit: Payer: Self-pay

## 2024-08-24 ENCOUNTER — Other Ambulatory Visit: Payer: Self-pay

## 2024-08-27 ENCOUNTER — Other Ambulatory Visit: Payer: Self-pay

## 2024-09-04 ENCOUNTER — Ambulatory Visit: Payer: Self-pay

## 2024-09-04 NOTE — Telephone Encounter (Signed)
" °  FYI Only or Action Required?: FYI only for provider: ED advised.  Patient was last seen in primary care on 01/25/2024 by Theotis Haze ORN, NP.  Called Nurse Triage reporting Chest Pain.  Symptoms began yesterday.  Interventions attempted: Nothing.  Symptoms are: gradually worsening.  Triage Disposition: Go to ED Now (Notify PCP)  Patient/caregiver understands and will follow disposition?:    Copied from CRM [id number]. Topic: Clinical - Red Word Triage >> Sep 04, 2024  1:51 PM Joesph B wrote: Red Word that prompted transfer to Nurse Triage: Heart palpitations, chest tightness and in her throat, uncomfortable . Reason for Disposition  Difficulty breathing  Answer Assessment - Initial Assessment Questions Triage stopped and referred to ER  1. LOCATION: Where does it hurt?       Left chest tightness since last night with fatigue and SOB today. Bp 180/116  Protocols used: Chest Pain-A-AH    "

## 2024-09-04 NOTE — Telephone Encounter (Signed)
 Noted
